# Patient Record
Sex: Male | Born: 1960
Health system: Southern US, Community
[De-identification: ages and names within clinical notes are randomized; demographics above are authoritative.]

## PROBLEM LIST (undated history)

## (undated) DIAGNOSIS — R011 Cardiac murmur, unspecified: Secondary | ICD-10-CM

## (undated) DIAGNOSIS — G4730 Sleep apnea, unspecified: Secondary | ICD-10-CM

## (undated) DIAGNOSIS — T7840XA Allergy, unspecified, initial encounter: Secondary | ICD-10-CM

## (undated) DIAGNOSIS — F419 Anxiety disorder, unspecified: Secondary | ICD-10-CM

## (undated) DIAGNOSIS — E7800 Pure hypercholesterolemia, unspecified: Secondary | ICD-10-CM

## (undated) DIAGNOSIS — I10 Essential (primary) hypertension: Secondary | ICD-10-CM

## (undated) DIAGNOSIS — B019 Varicella without complication: Secondary | ICD-10-CM

## (undated) HISTORY — PX: WISDOM TOOTH EXTRACTION: SHX21

## (undated) HISTORY — DX: Anxiety disorder, unspecified: F41.9

## (undated) HISTORY — DX: Allergy, unspecified, initial encounter: T78.40XA

## (undated) HISTORY — DX: Varicella without complication: B01.9

## (undated) HISTORY — DX: Pure hypercholesterolemia, unspecified: E78.00

## (undated) HISTORY — DX: Sleep apnea, unspecified: G47.30

## (undated) HISTORY — DX: Cardiac murmur, unspecified: R01.1

## (undated) HISTORY — PX: NO PAST SURGERIES: SHX2092

## (undated) HISTORY — PX: COLONOSCOPY: SHX174

---

## 2006-05-02 LAB — HM COLONOSCOPY

## 2008-05-02 LAB — HM COLONOSCOPY

## 2015-04-09 ENCOUNTER — Encounter (HOSPITAL_BASED_OUTPATIENT_CLINIC_OR_DEPARTMENT_OTHER): Payer: Self-pay | Admitting: *Deleted

## 2015-04-09 ENCOUNTER — Emergency Department (HOSPITAL_BASED_OUTPATIENT_CLINIC_OR_DEPARTMENT_OTHER): Payer: Managed Care, Other (non HMO)

## 2015-04-09 ENCOUNTER — Emergency Department (HOSPITAL_BASED_OUTPATIENT_CLINIC_OR_DEPARTMENT_OTHER)
Admission: EM | Admit: 2015-04-09 | Discharge: 2015-04-09 | Disposition: A | Discharge status: Home / Self Care | Payer: Managed Care, Other (non HMO) | Attending: Emergency Medicine | Admitting: Emergency Medicine

## 2015-04-09 DIAGNOSIS — F17210 Nicotine dependence, cigarettes, uncomplicated: Secondary | ICD-10-CM | POA: Diagnosis not present

## 2015-04-09 DIAGNOSIS — S4991XA Unspecified injury of right shoulder and upper arm, initial encounter: Secondary | ICD-10-CM | POA: Diagnosis present

## 2015-04-09 DIAGNOSIS — Y9389 Activity, other specified: Secondary | ICD-10-CM | POA: Diagnosis not present

## 2015-04-09 DIAGNOSIS — M791 Myalgia: Secondary | ICD-10-CM | POA: Diagnosis not present

## 2015-04-09 DIAGNOSIS — S46811A Strain of other muscles, fascia and tendons at shoulder and upper arm level, right arm, initial encounter: Secondary | ICD-10-CM | POA: Insufficient documentation

## 2015-04-09 DIAGNOSIS — S29012A Strain of muscle and tendon of back wall of thorax, initial encounter: Secondary | ICD-10-CM

## 2015-04-09 DIAGNOSIS — Z79899 Other long term (current) drug therapy: Secondary | ICD-10-CM | POA: Diagnosis not present

## 2015-04-09 DIAGNOSIS — Y998 Other external cause status: Secondary | ICD-10-CM | POA: Diagnosis not present

## 2015-04-09 DIAGNOSIS — M545 Low back pain: Secondary | ICD-10-CM | POA: Insufficient documentation

## 2015-04-09 DIAGNOSIS — X58XXXA Exposure to other specified factors, initial encounter: Secondary | ICD-10-CM | POA: Insufficient documentation

## 2015-04-09 DIAGNOSIS — I10 Essential (primary) hypertension: Secondary | ICD-10-CM | POA: Insufficient documentation

## 2015-04-09 DIAGNOSIS — Y9289 Other specified places as the place of occurrence of the external cause: Secondary | ICD-10-CM | POA: Insufficient documentation

## 2015-04-09 HISTORY — DX: Essential (primary) hypertension: I10

## 2015-04-09 LAB — TROPONIN I: Troponin I: <0.03 ng/mL (ref <0.031)

## 2015-04-09 LAB — COMPREHENSIVE METABOLIC PANEL
ALK PHOS: 67 U/L (ref 38–126)
ALT: 39 U/L (ref 17–63)
AST: 28 U/L (ref 15–41)
Albumin: 5.3 g/dL — ABNORMAL HIGH (ref 3.5–5.0)
Anion gap: 9 (ref 5–15)
BILIRUBIN TOTAL: 2.9 mg/dL — AB (ref 0.3–1.2)
BUN: 16 mg/dL (ref 6–20)
CHLORIDE: 106 mmol/L (ref 101–111)
CO2: 24 mmol/L (ref 22–32)
CREATININE: 1.18 mg/dL (ref 0.61–1.24)
Calcium: 9.6 mg/dL (ref 8.9–10.3)
GFR calc Af Amer: >60 mL/min (ref >60)
Glucose, Bld: 135 mg/dL — ABNORMAL HIGH (ref 65–99)
Potassium: 3.4 mmol/L — ABNORMAL LOW (ref 3.5–5.1)
Sodium: 139 mmol/L (ref 135–145)
TOTAL PROTEIN: 9 g/dL — AB (ref 6.5–8.1)

## 2015-04-09 LAB — CBC WITH DIFFERENTIAL/PLATELET
BASOS ABS: 0 10*3/uL (ref 0.0–0.1)
Basophils Relative: 0 %
Eosinophils Absolute: 0.1 10*3/uL (ref 0.0–0.7)
Eosinophils Relative: 1 %
HEMATOCRIT: 46.3 % (ref 39.0–52.0)
HEMOGLOBIN: 16.6 g/dL (ref 13.0–17.0)
LYMPHS PCT: 11 %
Lymphs Abs: 1.2 10*3/uL (ref 0.7–4.0)
MCH: 31.8 pg (ref 26.0–34.0)
MCHC: 35.9 g/dL (ref 30.0–36.0)
MCV: 88.7 fL (ref 78.0–100.0)
Monocytes Absolute: 0.9 10*3/uL (ref 0.1–1.0)
Monocytes Relative: 8 %
NEUTROS ABS: 8.6 10*3/uL — AB (ref 1.7–7.7)
Neutrophils Relative %: 80 %
Platelets: 208 10*3/uL (ref 150–400)
RBC: 5.22 MIL/uL (ref 4.22–5.81)
RDW: 12.4 % (ref 11.5–15.5)
WBC: 10.8 10*3/uL — AB (ref 4.0–10.5)

## 2015-04-09 LAB — D-DIMER, QUANTITATIVE (NOT AT ARMC)

## 2015-04-09 MED ORDER — METHOCARBAMOL 500 MG PO TABS: 1000.0000 mg | ORAL_TABLET | Freq: Three times a day (TID) | ORAL | Status: DC | PRN

## 2015-04-09 MED ORDER — OXYCODONE-ACETAMINOPHEN 5-325 MG PO TABS
2.0000 | ORAL_TABLET | Freq: Once | ORAL | Status: AC
  Administered 2015-04-09: 2 via ORAL
  Filled 2015-04-09: qty 2

## 2015-04-09 MED ORDER — KETOROLAC TROMETHAMINE 60 MG/2ML IM SOLN
60.0000 mg | Freq: Once | INTRAMUSCULAR | Status: AC
  Administered 2015-04-09: 60 mg via INTRAMUSCULAR
  Filled 2015-04-09: qty 2

## 2015-04-09 MED ORDER — IOHEXOL 350 MG/ML SOLN
100.0000 mL | Freq: Once | INTRAVENOUS | Status: AC | PRN
  Administered 2015-04-09: 100 mL via INTRAVENOUS

## 2015-04-09 MED ORDER — METHOCARBAMOL 500 MG PO TABS
1000.0000 mg | ORAL_TABLET | Freq: Once | ORAL | Status: AC
  Administered 2015-04-09: 1000 mg via ORAL
  Filled 2015-04-09: qty 2

## 2015-04-09 MED ORDER — OXYCODONE-ACETAMINOPHEN 5-325 MG PO TABS: 1.0000 | ORAL_TABLET | ORAL | Status: DC | PRN

## 2015-04-09 MED ORDER — IBUPROFEN 600 MG PO TABS: 600.0000 mg | ORAL_TABLET | Freq: Four times a day (QID) | ORAL | Status: DC | PRN

## 2015-04-09 NOTE — ED Notes (Signed)
Safety measures in place, sr x 2 up, wife at bedside

## 2015-04-09 NOTE — ED Provider Notes (Signed)
CSN: [id number]     Arrival date & time 04/09/15  [id number] History   First MD Initiated Contact with Patient 04/09/15 1115     Chief Complaint  Patient presents with  . Arm Pain     (Consider location/radiation/quality/duration/timing/severity/associated sxs/prior Treatment) HPI Patient presents with right thoracic back pain worsening over the last few days. No radiation of the pain. No known trauma or heavy lifting. Patient works at a desk on the computer. No recent extended travel. No lower extremity swelling or pain. Denies any shortness of breath or chest pain. Pain is worse with movement and palpation. Patient went to medical clinic to have this evaluated. Became diaphoretic and was referred to the emergency department for evaluation. Past Medical History  Diagnosis Date  . Hypertension    History reviewed. No pertinent past surgical history. No family history on file. Social History  Substance Use Topics  . Smoking status: Current Every Day Smoker -- 1.00 packs/day for 25 years    Types: Cigarettes  . Smokeless tobacco: None  . Alcohol Use: Yes     Comment: social    Review of Systems  Constitutional: Positive for diaphoresis. Negative for fever and chills.  Respiratory: Negative for cough and shortness of breath.   Cardiovascular: Negative for chest pain, palpitations and leg swelling.  Gastrointestinal: Negative for nausea, vomiting and abdominal pain.  Musculoskeletal: Positive for myalgias and back pain. Negative for neck pain and neck stiffness.  Skin: Negative for rash and wound.  Neurological: Negative for dizziness, syncope, weakness, light-headedness, numbness and headaches.  All other systems reviewed and are negative.     Allergies  Review of patient's allergies indicates no known allergies.  Home Medications   Prior to Admission medications   Medication Sig Start Date End Date Taking? Authorizing Provider  amLODipine (NORVASC) 10 MG tablet Take 10 mg by  mouth daily.   Yes Historical Provider, MD  ibuprofen (ADVIL,MOTRIN) 600 MG tablet Take 1 tablet (600 mg total) by mouth every 6 (six) hours as needed. 04/09/15   Julianne Rice, MD  methocarbamol (ROBAXIN) 500 MG tablet Take 2 tablets (1,000 mg total) by mouth every 8 (eight) hours as needed for muscle spasms. 04/09/15   Julianne Rice, MD  oxyCODONE-acetaminophen (PERCOCET) 5-325 MG tablet Take 1-2 tablets by mouth every 4 (four) hours as needed for severe pain. 04/09/15   Julianne Rice, MD  terazosin (HYTRIN) 1 MG capsule Take 1 mg by mouth at bedtime.   Yes Historical Provider, MD   BP 145/77 mmHg  Pulse 66  Temp(Src) 98.1 F (36.7 C) (Oral)  Resp 18  Ht 5\' 11"  (1.803 m)  Wt 260 lb (117.935 kg)  BMI 36.28 kg/m2  SpO2 98% Physical Exam  Constitutional: He is oriented to person, place, and time. He appears well-developed and well-nourished. No distress.  HENT:  Head: Normocephalic and atraumatic.  Mouth/Throat: Oropharynx is clear and moist. No oropharyngeal exudate.  Eyes: EOM are normal. Pupils are equal, round, and reactive to light.  Neck: Normal range of motion. Neck supple. No JVD present.  Cardiovascular: Normal rate and regular rhythm.  Exam reveals no gallop and no friction rub.   No murmur heard. Pulmonary/Chest: Effort normal and breath sounds normal. No respiratory distress. He has no wheezes. He has no rales. He exhibits no tenderness.  Abdominal: Soft. Bowel sounds are normal. He exhibits no distension and no mass. There is no tenderness. There is no rebound and no guarding.  Musculoskeletal: Normal range of motion. He  exhibits no edema or tenderness.  No calf swelling or tenderness. Patient does have some tenderness to palpation over the right rhomboid musculature. There is no midline thoracic or lumbar tenderness.  Neurological: He is alert and oriented to person, place, and time.  5/5 motor in all extremities. Sensation is fully intact. Patient is ambulating without  any difficulty.  Skin: Skin is warm and dry. No rash noted. No erythema.  Psychiatric: He has a normal mood and affect. His behavior is normal.  Nursing note and vitals reviewed.   ED Course  Procedures (including critical care time) Labs Review Labs Reviewed  CBC WITH DIFFERENTIAL/PLATELET - Abnormal; Notable for the following:    WBC 10.8 (*)    Neutro Abs 8.6 (*)    All other components within normal limits  COMPREHENSIVE METABOLIC PANEL - Abnormal; Notable for the following:    Potassium 3.4 (*)    Glucose, Bld 135 (*)    Total Protein 9.0 (*)    Albumin 5.3 (*)    Total Bilirubin 2.9 (*)    All other components within normal limits  D-DIMER, QUANTITATIVE (NOT AT [facility])  TROPONIN I    Imaging Review Dg Chest 2 View  04/09/2015  CLINICAL DATA:  Right upper thoracic back pain for 2 weeks. EXAM: CHEST  2 VIEW COMPARISON:  None. FINDINGS: The heart size and mediastinal contours are within normal limits. Both lungs are clear. No pneumothorax or pleural effusion is noted. The visualized skeletal structures are unremarkable. IMPRESSION: No active cardiopulmonary disease. Electronically Signed   By: Marijo Conception, M.D.   On: 04/09/2015 11:53   Ct Angio Chest Aorta W/cm &/or Wo/cm  04/09/2015  CLINICAL DATA:  Right upper thoracic pain for 1.5 weeks. EXAM: CT ANGIOGRAPHY CHEST WITH CONTRAST TECHNIQUE: Multidetector CT imaging of the chest was performed using the standard protocol during bolus administration of intravenous contrast. Multiplanar CT image reconstructions and MIPs were obtained to evaluate the vascular anatomy. CONTRAST:  181mL OMNIPAQUE IOHEXOL 350 MG/ML SOLN COMPARISON:  None. FINDINGS: THORACIC INLET/BODY WALL: No acute abnormality. MEDIASTINUM: Normal heart size. No pericardial effusion. Moderate for age aortic valve calcification and possible thickening. Normal caliber of the aorta. No aortic atherosclerotic change or aneurysm. No dissection or intramural hematoma. The  opacified pulmonary arteries show no filling defect. No adenopathy. LUNG WINDOWS: There is no edema, consolidation, effusion, or pneumothorax. UPPER ABDOMEN: No acute findings. Low-density liver which could be from timing or steatosis. OSSEOUS: Thoracic spondylosis. No acute fracture. No suspicious lytic or blastic lesions. Review of the MIP images confirms the above findings. IMPRESSION: 1. Unremarkable CTA of the thoracic aorta. 2. Notable for age aortic valve calcification with possible thickening. Recommend echocardiographic follow-up. 3. Right coronary artery atherosclerotic calcification. Electronically Signed   By: Monte Fantasia M.D.   On: 04/09/2015 14:16   I have personally reviewed and evaluated these images and lab results as part of my medical decision-making.   EKG Interpretation   Date/Time:  Thursday April 09 2015 11:53:18 EST Ventricular Rate:  60 PR Interval:  182 QRS Duration: 100 QT Interval:  418 QTC Calculation: 418 R Axis:   -48 Text Interpretation:  Normal sinus rhythm Left axis deviation Pulmonary  disease pattern Abnormal ECG Confirmed by Lita Mains  MD, Zhuri Krass ([id number]) on  04/09/2015 2:30:18 PM      MDM   Final diagnoses:  Rhomboid muscle strain, initial encounter    Patient with thoracic back pain. Initial workup negative including d-dimer and cardiac workup. Patient  with persistent pain despite Toradol and Robaxin. CT imaging to room aorta demonstrated no dissection but there was some calcification present patient was informed of results need to follow-up as an outpatient. He is feeling much better at this point. We'll discharge home with return precautions.    Julianne Rice, MD 04/09/15 1430

## 2015-04-09 NOTE — ED Notes (Signed)
Family at bedside. 

## 2015-04-09 NOTE — ED Notes (Signed)
ECG done as per EDP orders

## 2015-04-09 NOTE — Discharge Instructions (Signed)

## 2015-04-09 NOTE — ED Notes (Signed)
States last pain med provided much better relief

## 2015-04-09 NOTE — ED Notes (Signed)
Patient transported to X-ray via stretcher, sr x 2 up

## 2015-04-09 NOTE — ED Notes (Signed)
No chest pain or shortness of breath. 

## 2015-04-09 NOTE — ED Notes (Signed)
Rt arm pain, described, muscle cramping intermittently, "cramping around a nerve" which then causes sharp pain at shoulder with numbess at rt elbow to rt forearm

## 2015-06-09 ENCOUNTER — Encounter: Payer: Self-pay | Admitting: Behavioral Health

## 2015-06-09 ENCOUNTER — Telehealth: Payer: Self-pay | Admitting: Behavioral Health

## 2015-06-09 NOTE — Telephone Encounter (Signed)
Pre-Visit Call completed with patient and chart updated.   Pre-Visit Info documented in Specialty Comments under [employer].    

## 2015-06-09 NOTE — Addendum Note (Signed)
Addended by: Eduard Roux E on: 06/09/2015 10:37 AM   Modules accepted: Orders, Medications

## 2015-06-09 NOTE — Telephone Encounter (Signed)
Unable to reach patient at time of Pre-Visit Call.  Left message for patient to return call when available.    

## 2015-06-10 ENCOUNTER — Encounter: Payer: Self-pay | Admitting: Physician Assistant

## 2015-06-10 ENCOUNTER — Telehealth: Payer: Self-pay | Admitting: Physician Assistant

## 2015-06-10 ENCOUNTER — Ambulatory Visit (INDEPENDENT_AMBULATORY_CARE_PROVIDER_SITE_OTHER): Payer: Managed Care, Other (non HMO) | Admitting: Physician Assistant

## 2015-06-10 VITALS — BP 142/70 | HR 71 | Temp 98.0°F | Ht 71.0 in | Wt 272.4 lb

## 2015-06-10 DIAGNOSIS — I10 Essential (primary) hypertension: Secondary | ICD-10-CM | POA: Diagnosis not present

## 2015-06-10 DIAGNOSIS — Z1211 Encounter for screening for malignant neoplasm of colon: Secondary | ICD-10-CM

## 2015-06-10 DIAGNOSIS — Z23 Encounter for immunization: Secondary | ICD-10-CM

## 2015-06-10 DIAGNOSIS — G4733 Obstructive sleep apnea (adult) (pediatric): Secondary | ICD-10-CM | POA: Diagnosis not present

## 2015-06-10 MED ORDER — AMLODIPINE BESYLATE 10 MG PO TABS: 10.0000 mg | ORAL_TABLET | Freq: Every day | ORAL | Status: DC

## 2015-06-10 MED ORDER — FLUTICASONE PROPIONATE 50 MCG/ACT NA SUSP: 2.0000 | Freq: Every day | NASAL | Status: DC

## 2015-06-10 NOTE — Progress Notes (Signed)
Patient presents to clinic today to establish care.  Patient with history of HTN, currently on Amlodipine 10 mg daily and Terazosin 1 mg nightly. Endorses prior workup for HTN including echo and stress test that were both unremarkable. Patient denies chest pain, palpitations, lightheadedness, dizziness, vision changes or frequent headaches.  BP Readings from Last 3 Encounters:  06/10/15 142/70  04/09/15 161/89   Patient is a current cigarette smoker. He is trying to quit by using Nicoderm. Is down from 1ppd to 0.5 ppd. Denies history of high cholesterol but does endorses history of elevated triglycerides. Is watching diet and trying to exercise but notes he could do a lot better.   Past Medical History  Diagnosis Date  . Hypertension   . Allergy     Seasonal  . Sleep apnea     CPAP nightly  . Heart murmur     Asymptomatic -- Patient endorses previous negative evaluation  . Chicken pox     Current Outpatient Prescriptions on File Prior to Visit  Medication Sig Dispense Refill  . amLODipine (NORVASC) 10 MG tablet Take 10 mg by mouth daily.    . fluticasone (FLONASE) 50 MCG/ACT nasal spray Place 2 sprays into both nostrils daily.    . Tadalafil (CIALIS PO) Take 1 tablet by mouth as needed.    . terazosin (HYTRIN) 1 MG capsule Take 1 mg by mouth at bedtime.     No current facility-administered medications on file prior to visit.    No Known Allergies  Family History  Problem Relation Age of Onset  . Heart attack Father 77  . Heart attack Paternal Uncle 57  . Heart attack Paternal Grandfather 54  . Cancer Maternal Grandmother 62    Unsure  . Alcohol abuse Sister     Social History   Social History  . Marital Status: Married    Spouse Name: N/A  . Number of Children: N/A  . Years of Education: N/A   Social History Main Topics  . Smoking status: Current Every Day Smoker -- 0.50 packs/day for 25 years    Types: Cigarettes  . Smokeless tobacco: Never Used   Comment: is on the Nictoine patch presently  . Alcohol Use: 0.0 oz/week    0 Standard drinks or equivalent per week     Comment: social  . Drug Use: Yes    Special: Marijuana  . Sexual Activity: Not Asked   Other Topics Concern  . None   Social History Narrative   ROS   BP 142/70 mmHg  Pulse 71  Temp(Src) 98 F (36.7 C) (Oral)  Ht 5' 11" (1.803 m)  Wt 272 lb 6.4 oz (123.56 kg)  BMI 38.01 kg/m2  SpO2 96%  Physical Exam  Constitutional: He is oriented to person, place, and time and well-developed, well-nourished, and in no distress.  HENT:  Head: Normocephalic and atraumatic.  Eyes: Conjunctivae are normal.  Neck: Neck supple. No thyromegaly present.  Cardiovascular: Normal rate, regular rhythm, normal heart sounds and intact distal pulses.   Pulmonary/Chest: Effort normal and breath sounds normal. No respiratory distress. He has no wheezes. He has no rales. He exhibits no tenderness.  Neurological: He is alert and oriented to person, place, and time.  Skin: Skin is warm and dry. No rash noted.  Psychiatric: Affect normal.  Vitals reviewed.   Recent Results (from the past 2160 hour(s))  CBC with Differential/Platelet     Status: Abnormal   Collection Time: 04/09/15 11:40 AM  Result  Value Ref Range   WBC 10.8 (H) 4.0 - 10.5 K/uL   RBC 5.22 4.22 - 5.81 MIL/uL   Hemoglobin 16.6 13.0 - 17.0 g/dL   HCT 46.3 39.0 - 52.0 %   MCV 88.7 78.0 - 100.0 fL   MCH 31.8 26.0 - 34.0 pg   MCHC 35.9 30.0 - 36.0 g/dL   RDW 12.4 11.5 - 15.5 %   Platelets 208 150 - 400 K/uL   Neutrophils Relative % 80 %   Neutro Abs 8.6 (H) 1.7 - 7.7 K/uL   Lymphocytes Relative 11 %   Lymphs Abs 1.2 0.7 - 4.0 K/uL   Monocytes Relative 8 %   Monocytes Absolute 0.9 0.1 - 1.0 K/uL   Eosinophils Relative 1 %   Eosinophils Absolute 0.1 0.0 - 0.7 K/uL   Basophils Relative 0 %   Basophils Absolute 0.0 0.0 - 0.1 K/uL  Comprehensive metabolic panel     Status: Abnormal   Collection Time: 04/09/15 11:40  AM  Result Value Ref Range   Sodium 139 135 - 145 mmol/L   Potassium 3.4 (L) 3.5 - 5.1 mmol/L   Chloride 106 101 - 111 mmol/L   CO2 24 22 - 32 mmol/L   Glucose, Bld 135 (H) 65 - 99 mg/dL   BUN 16 6 - 20 mg/dL   Creatinine, Ser 1.18 0.61 - 1.24 mg/dL   Calcium 9.6 8.9 - 10.3 mg/dL   Total Protein 9.0 (H) 6.5 - 8.1 g/dL   Albumin 5.3 (H) 3.5 - 5.0 g/dL   AST 28 15 - 41 U/L   ALT 39 17 - 63 U/L   Alkaline Phosphatase 67 38 - 126 U/L   Total Bilirubin 2.9 (H) 0.3 - 1.2 mg/dL   GFR calc non Af Amer >60 >60 mL/min   GFR calc Af Amer >60 >60 mL/min    Comment: (NOTE) The eGFR has been calculated using the CKD EPI equation. This calculation has not been validated in all clinical situations. eGFR's persistently <60 mL/min signify possible Chronic Kidney Disease.    Anion gap 9 5 - 15  D-dimer, quantitative (not at [facility])     Status: None   Collection Time: 04/09/15 11:40 AM  Result Value Ref Range   D-Dimer, Quant <0.27 0.00 - 0.50 ug/mL-FEU    Comment: (NOTE) At the manufacturer cut-off of 0.50 ug/mL FEU, this assay has been documented to exclude PE with a sensitivity and negative predictive value of 97 to 99%.  At this time, this assay has not been approved by the FDA to exclude DVT/VTE. Results should be correlated with clinical presentation.   Troponin I     Status: None   Collection Time: 04/09/15 11:40 AM  Result Value Ref Range   Troponin I <0.03 <0.031 ng/mL    Comment:        NO INDICATION OF MYOCARDIAL INJURY.    Assessment/Plan: Essential hypertension, benign BP initially above goal at 158/80. On recheck at end of visit BP at 142/70 . Asymptomatic. DASH diet and exercise encouraged. Continue Norvasc and Hyrtin. Follow-up 1 month for CPE  OSA (obstructive sleep apnea) Referral to pulmonary placed for monitoring and repeat assessment. Patient new to the area.  Encounter for immunization Flu shot given by nursing staff

## 2015-06-10 NOTE — Telephone Encounter (Signed)
Relation to [id number] Call back [phone] Pharmacy: [facility] #[id number] - [place], [place] - [address] [phone] (Phone) [phone] (Fax)         Reason for call:  Patient requesting amLODipine (NORVASC) 10 MG tablet, fluticasone (FLONASE) 50 MCG/ACT nasal spray, Tadalafil (CIALIS PO), terazosin (HYTRIN) 1 MG capsule

## 2015-06-10 NOTE — Assessment & Plan Note (Signed)
Flu shot given by nursing staff. 

## 2015-06-10 NOTE — Telephone Encounter (Signed)
Called and spoke with the pt and he stated that he will only need the Amlodipine 10mg  and Flonase refilled for now.  Rx's sent to the pharmacy by e-script.//AB/CMA

## 2015-06-10 NOTE — Patient Instructions (Addendum)
Please continue medications as directed. Increase exercise to an eventual goal of 150 minutes per week. Continue smoking cessation efforts.  Follow the diet below. We will reassess BP at follow-up for CPE in 1 month.  DASH Eating Plan DASH stands for "Dietary Approaches to Stop Hypertension." The DASH eating plan is a healthy eating plan that has been shown to reduce high blood pressure (hypertension). Additional health benefits may include reducing the risk of type 2 diabetes mellitus, heart disease, and stroke. The DASH eating plan may also help with weight loss. WHAT DO I NEED TO KNOW ABOUT THE DASH EATING PLAN? For the DASH eating plan, you will follow these general guidelines:  Choose foods with a percent daily value for sodium of less than 5% (as listed on the food label).  Use salt-free seasonings or herbs instead of table salt or sea salt.  Check with your health care provider or pharmacist before using salt substitutes.  Eat lower-sodium products, often labeled as "lower sodium" or "no salt added."  Eat fresh foods.  Eat more vegetables, fruits, and low-fat dairy products.  Choose whole grains. Look for the word "whole" as the first word in the ingredient list.  Choose fish and skinless chicken or [place] more often than red meat. Limit fish, poultry, and meat to 6 oz (170 g) each day.  Limit sweets, desserts, sugars, and sugary drinks.  Choose heart-healthy fats.  Limit cheese to 1 oz (28 g) per day.  Eat more home-cooked food and less restaurant, buffet, and fast food.  Limit fried foods.  Cook foods using methods other than frying.  Limit canned vegetables. If you do use them, rinse them well to decrease the sodium.  When eating at a restaurant, ask that your food be prepared with less salt, or no salt if possible. WHAT FOODS CAN I EAT? Seek help from a dietitian for individual calorie needs. Grains Whole grain or whole wheat bread. Brown rice. Whole grain or  whole wheat pasta. Quinoa, bulgur, and whole grain cereals. Low-sodium cereals. Corn or whole wheat flour tortillas. Whole grain cornbread. Whole grain crackers. Low-sodium crackers. Vegetables Fresh or frozen vegetables (raw, steamed, roasted, or grilled). Low-sodium or reduced-sodium tomato and vegetable juices. Low-sodium or reduced-sodium tomato sauce and paste. Low-sodium or reduced-sodium canned vegetables.  Fruits All fresh, canned (in natural juice), or frozen fruits. Meat and Other Protein Products Ground beef (85% or leaner), grass-fed beef, or beef trimmed of fat. Skinless chicken or [place]. Ground chicken or [place]. Pork trimmed of fat. All fish and seafood. Eggs. Dried beans, peas, or lentils. Unsalted nuts and seeds. Unsalted canned beans. Dairy Low-fat dairy products, such as skim or 1% milk, 2% or reduced-fat cheeses, low-fat ricotta or cottage cheese, or plain low-fat yogurt. Low-sodium or reduced-sodium cheeses. Fats and Oils Tub margarines without trans fats. Light or reduced-fat mayonnaise and salad dressings (reduced sodium). Avocado. Safflower, olive, or canola oils. Natural peanut or almond butter. Other Unsalted popcorn and pretzels. The items listed above may not be a complete list of recommended foods or beverages. Contact your dietitian for more options. WHAT FOODS ARE NOT RECOMMENDED? Grains White bread. White pasta. White rice. Refined cornbread. Bagels and croissants. Crackers that contain trans fat. Vegetables Creamed or fried vegetables. Vegetables in a cheese sauce. Regular canned vegetables. Regular canned tomato sauce and paste. Regular tomato and vegetable juices. Fruits Dried fruits. Canned fruit in light or heavy syrup. Fruit juice. Meat and Other Protein Products Fatty cuts of meat. Ribs, chicken  wings, bacon, sausage, bologna, salami, chitterlings, fatback, hot dogs, bratwurst, and packaged luncheon meats. Salted nuts and seeds. Canned beans with  salt. Dairy Whole or 2% milk, cream, half-and-half, and cream cheese. Whole-fat or sweetened yogurt. Full-fat cheeses or blue cheese. Nondairy creamers and whipped toppings. Processed cheese, cheese spreads, or cheese curds. Condiments Onion and garlic salt, seasoned salt, table salt, and sea salt. Canned and packaged gravies. Worcestershire sauce. Tartar sauce. Barbecue sauce. Teriyaki sauce. Soy sauce, including reduced sodium. Steak sauce. Fish sauce. Oyster sauce. Cocktail sauce. Horseradish. Ketchup and mustard. Meat flavorings and tenderizers. Bouillon cubes. Hot sauce. Tabasco sauce. Marinades. Taco seasonings. Relishes. Fats and Oils Butter, stick margarine, lard, shortening, ghee, and bacon fat. Coconut, palm kernel, or palm oils. Regular salad dressings. Other Pickles and olives. Salted popcorn and pretzels. The items listed above may not be a complete list of foods and beverages to avoid. Contact your dietitian for more information. WHERE CAN I FIND MORE INFORMATION? National Heart, Lung, and Blood Institute: travelstabloid.com   This information is not intended to replace advice given to you by your health care provider. Make sure you discuss any questions you have with your health care provider.   Document Released: 04/07/2011 Document Revised: 05/09/2014 Document Reviewed: 02/20/2013 Elsevier Interactive Patient Education [employer].

## 2015-06-10 NOTE — Assessment & Plan Note (Signed)
Referral to pulmonary placed for monitoring and repeat assessment. Patient new to the area.

## 2015-06-10 NOTE — Assessment & Plan Note (Addendum)
BP initially above goal at 158/80. On recheck at end of visit BP at 142/70 . Asymptomatic. DASH diet and exercise encouraged. Continue Norvasc and Hyrtin. Follow-up 1 month for CPE

## 2015-06-10 NOTE — Progress Notes (Signed)
Pre visit review using our clinic review tool, if applicable. No additional management support is needed unless otherwise documented below in the visit note. 

## 2015-06-29 ENCOUNTER — Encounter: Payer: Self-pay | Admitting: Physician Assistant

## 2015-06-29 ENCOUNTER — Ambulatory Visit (INDEPENDENT_AMBULATORY_CARE_PROVIDER_SITE_OTHER): Payer: Managed Care, Other (non HMO) | Admitting: Physician Assistant

## 2015-06-29 VITALS — BP 148/81 | HR 90 | Temp 97.2°F | Ht 71.0 in | Wt 261.1 lb

## 2015-06-29 DIAGNOSIS — J019 Acute sinusitis, unspecified: Secondary | ICD-10-CM | POA: Diagnosis not present

## 2015-06-29 DIAGNOSIS — B9689 Other specified bacterial agents as the cause of diseases classified elsewhere: Secondary | ICD-10-CM

## 2015-06-29 MED ORDER — AMOXICILLIN-POT CLAVULANATE 875-125 MG PO TABS: 1.0000 | ORAL_TABLET | Freq: Two times a day (BID) | ORAL | Status: DC

## 2015-06-29 NOTE — Patient Instructions (Signed)
Please take antibiotic as directed.  Increase fluid intake.  Use Saline nasal spray.  Take a daily multivitamin. Continue Claritin.  Place a humidifier in the bedroom.  Please call or return clinic if symptoms are not improving.  Sinusitis Sinusitis is redness, soreness, and swelling (inflammation) of the paranasal sinuses. Paranasal sinuses are air pockets within the bones of your face (beneath the eyes, the middle of the forehead, or above the eyes). In healthy paranasal sinuses, mucus is able to drain out, and air is able to circulate through them by way of your nose. However, when your paranasal sinuses are inflamed, mucus and air can become trapped. This can allow bacteria and other germs to grow and cause infection. Sinusitis can develop quickly and last only a short time (acute) or continue over a long period (chronic). Sinusitis that lasts for more than 12 weeks is considered chronic.  CAUSES  Causes of sinusitis include:  Allergies.  Structural abnormalities, such as displacement of the cartilage that separates your nostrils (deviated septum), which can decrease the air flow through your nose and sinuses and affect sinus drainage.  Functional abnormalities, such as when the small hairs (cilia) that line your sinuses and help remove mucus do not work properly or are not present. SYMPTOMS  Symptoms of acute and chronic sinusitis are the same. The primary symptoms are pain and pressure around the affected sinuses. Other symptoms include:  Upper toothache.  Earache.  Headache.  Bad breath.  Decreased sense of smell and taste.  A cough, which worsens when you are lying flat.  Fatigue.  Fever.  Thick drainage from your nose, which often is green and may contain pus (purulent).  Swelling and warmth over the affected sinuses. DIAGNOSIS  Your caregiver will perform a physical exam. During the exam, your caregiver may:  Look in your nose for signs of abnormal growths in your  nostrils (nasal polyps).  Tap over the affected sinus to check for signs of infection.  View the inside of your sinuses (endoscopy) with a special imaging device with a light attached (endoscope), which is inserted into your sinuses. If your caregiver suspects that you have chronic sinusitis, one or more of the following tests may be recommended:  Allergy tests.  Nasal culture A sample of mucus is taken from your nose and sent to a lab and screened for bacteria.  Nasal cytology A sample of mucus is taken from your nose and examined by your caregiver to determine if your sinusitis is related to an allergy. TREATMENT  Most cases of acute sinusitis are related to a viral infection and will resolve on their own within 10 days. Sometimes medicines are prescribed to help relieve symptoms (pain medicine, decongestants, nasal steroid sprays, or saline sprays).  However, for sinusitis related to a bacterial infection, your caregiver will prescribe antibiotic medicines. These are medicines that will help kill the bacteria causing the infection.  Rarely, sinusitis is caused by a fungal infection. In theses cases, your caregiver will prescribe antifungal medicine. For some cases of chronic sinusitis, surgery is needed. Generally, these are cases in which sinusitis recurs more than 3 times per year, despite other treatments. HOME CARE INSTRUCTIONS   Drink plenty of water. Water helps thin the mucus so your sinuses can drain more easily.  Use a humidifier.  Inhale steam 3 to 4 times a day (for example, sit in the bathroom with the shower running).  Apply a warm, moist washcloth to your face 3 to 4 times  a day, or as directed by your caregiver.  Use saline nasal sprays to help moisten and clean your sinuses.  Take over-the-counter or prescription medicines for pain, discomfort, or fever only as directed by your caregiver. SEEK IMMEDIATE MEDICAL CARE IF:  You have increasing pain or severe  headaches.  You have nausea, vomiting, or drowsiness.  You have swelling around your face.  You have vision problems.  You have a stiff neck.  You have difficulty breathing. MAKE SURE YOU:   Understand these instructions.  Will watch your condition.  Will get help right away if you are not doing well or get worse. Document Released: 04/18/2005 Document Revised: 07/11/2011 Document Reviewed: 05/03/2011 Exit[employer] Patient Information 2014 [employer].   

## 2015-06-29 NOTE — Progress Notes (Signed)
Pre visit review using our clinic review tool, if applicable. No additional management support is needed unless otherwise documented below in the visit note. 

## 2015-06-29 NOTE — Progress Notes (Signed)
Patient presents to clinic today c/o 10 days of sinus pressure, sinus pain with headaches and facial pain. Endorses dry cough and fatigue. Notes R ear pain x 1 day. Denies drainage from ear. Endorses mild fever off an on for the past few days. Denies recent travel or sick contact. Has history of seasonal allergies.   Past Medical History  Diagnosis Date  . Hypertension   . Allergy     Seasonal  . Sleep apnea     CPAP nightly  . Heart murmur     Asymptomatic -- Patient endorses previous negative evaluation  . Chicken pox     Current Outpatient Prescriptions on File Prior to Visit  Medication Sig Dispense Refill  . amLODipine (NORVASC) 10 MG tablet Take 1 tablet (10 mg total) by mouth daily. 30 tablet 0  . fluticasone (FLONASE) 50 MCG/ACT nasal spray Place 2 sprays into both nostrils daily. 16 g 0  . ibuprofen (ADVIL,MOTRIN) 600 MG tablet Take 1 tablet by mouth as needed.  0  . terazosin (HYTRIN) 1 MG capsule Take 1 mg by mouth at bedtime.     No current facility-administered medications on file prior to visit.    No Known Allergies  Family History  Problem Relation Age of Onset  . Heart attack Father 23  . Heart attack Paternal Uncle 80  . Heart attack Paternal Grandfather 60  . Cancer Maternal Grandmother 78    Unsure  . Alcohol abuse Sister     Social History   Social History  . Marital Status: Married    Spouse Name: N/A  . Number of Children: N/A  . Years of Education: N/A   Social History Main Topics  . Smoking status: Current Every Day Smoker -- 0.50 packs/day for 25 years    Types: Cigarettes  . Smokeless tobacco: Never Used     Comment: is on the Nictoine patch presently  . Alcohol Use: 0.0 oz/week    0 Standard drinks or equivalent per week     Comment: social  . Drug Use: Yes    Special: Marijuana  . Sexual Activity: Not Asked   Other Topics Concern  . None   Social History Narrative   Review of Systems - See HPI.  All other ROS are  negative.  BP 148/81 mmHg  Pulse 90  Temp(Src) 97.2 F (36.2 C) (Oral)  Ht '5\' 11"'$  (1.803 m)  Wt 261 lb 1.6 oz (118.434 kg)  BMI 36.43 kg/m2  SpO2 97%  Physical Exam  Constitutional: He is oriented to person, place, and time and well-developed, well-nourished, and in no distress.  HENT:  Head: Normocephalic and atraumatic.  Right Ear: Tympanic membrane is erythematous.  Nose: Mucosal edema and rhinorrhea present. Right sinus exhibits frontal sinus tenderness. Left sinus exhibits frontal sinus tenderness.  Mouth/Throat: Uvula is midline, oropharynx is clear and moist and mucous membranes are normal.  Eyes: Conjunctivae are normal.  Cardiovascular: Normal rate, regular rhythm, normal heart sounds and intact distal pulses.   Pulmonary/Chest: Effort normal and breath sounds normal. No respiratory distress. He has no wheezes. He has no rales. He exhibits no tenderness.  Neurological: He is alert and oriented to person, place, and time.  Skin: Skin is warm and dry. No rash noted.  Psychiatric: Affect normal.  Vitals reviewed.   Recent Results (from the past 2160 hour(s))  CBC with Differential/Platelet     Status: Abnormal   Collection Time: 04/09/15 11:40 AM  Result Value Ref Range  WBC 10.8 (H) 4.0 - 10.5 K/uL   RBC 5.22 4.22 - 5.81 MIL/uL   Hemoglobin 16.6 13.0 - 17.0 g/dL   HCT 46.3 39.0 - 52.0 %   MCV 88.7 78.0 - 100.0 fL   MCH 31.8 26.0 - 34.0 pg   MCHC 35.9 30.0 - 36.0 g/dL   RDW 12.4 11.5 - 15.5 %   Platelets 208 150 - 400 K/uL   Neutrophils Relative % 80 %   Neutro Abs 8.6 (H) 1.7 - 7.7 K/uL   Lymphocytes Relative 11 %   Lymphs Abs 1.2 0.7 - 4.0 K/uL   Monocytes Relative 8 %   Monocytes Absolute 0.9 0.1 - 1.0 K/uL   Eosinophils Relative 1 %   Eosinophils Absolute 0.1 0.0 - 0.7 K/uL   Basophils Relative 0 %   Basophils Absolute 0.0 0.0 - 0.1 K/uL  Comprehensive metabolic panel     Status: Abnormal   Collection Time: 04/09/15 11:40 AM  Result Value Ref Range    Sodium 139 135 - 145 mmol/L   Potassium 3.4 (L) 3.5 - 5.1 mmol/L   Chloride 106 101 - 111 mmol/L   CO2 24 22 - 32 mmol/L   Glucose, Bld 135 (H) 65 - 99 mg/dL   BUN 16 6 - 20 mg/dL   Creatinine, Ser 1.18 0.61 - 1.24 mg/dL   Calcium 9.6 8.9 - 10.3 mg/dL   Total Protein 9.0 (H) 6.5 - 8.1 g/dL   Albumin 5.3 (H) 3.5 - 5.0 g/dL   AST 28 15 - 41 U/L   ALT 39 17 - 63 U/L   Alkaline Phosphatase 67 38 - 126 U/L   Total Bilirubin 2.9 (H) 0.3 - 1.2 mg/dL   GFR calc non Af Amer >60 >60 mL/min   GFR calc Af Amer >60 >60 mL/min    Comment: (NOTE) The eGFR has been calculated using the CKD EPI equation. This calculation has not been validated in all clinical situations. eGFR's persistently <60 mL/min signify possible Chronic Kidney Disease.    Anion gap 9 5 - 15  D-dimer, quantitative (not at [facility])     Status: None   Collection Time: 04/09/15 11:40 AM  Result Value Ref Range   D-Dimer, Quant <0.27 0.00 - 0.50 ug/mL-FEU    Comment: (NOTE) At the manufacturer cut-off of 0.50 ug/mL FEU, this assay has been documented to exclude PE with a sensitivity and negative predictive value of 97 to 99%.  At this time, this assay has not been approved by the FDA to exclude DVT/VTE. Results should be correlated with clinical presentation.   Troponin I     Status: None   Collection Time: 04/09/15 11:40 AM  Result Value Ref Range   Troponin I <0.03 <0.031 ng/mL    Comment:        NO INDICATION OF MYOCARDIAL INJURY.     Assessment/Plan: No problem-specific assessment & plan notes found for this encounter.

## 2015-06-29 NOTE — Assessment & Plan Note (Signed)
Rx Augmentin.  Increase fluids.  Rest.  Saline nasal spray.  Probiotic.  Mucinex as directed.  Humidifier in bedroom.  Call or return to clinic if symptoms are not improving.  

## 2015-07-01 ENCOUNTER — Ambulatory Visit (INDEPENDENT_AMBULATORY_CARE_PROVIDER_SITE_OTHER): Payer: Managed Care, Other (non HMO) | Admitting: Pulmonary Disease

## 2015-07-01 ENCOUNTER — Encounter: Payer: Self-pay | Admitting: Pulmonary Disease

## 2015-07-01 VITALS — BP 132/82 | HR 76 | Ht 71.0 in | Wt 262.8 lb

## 2015-07-01 DIAGNOSIS — G4733 Obstructive sleep apnea (adult) (pediatric): Secondary | ICD-10-CM | POA: Diagnosis not present

## 2015-07-01 DIAGNOSIS — Z720 Tobacco use: Secondary | ICD-10-CM

## 2015-07-01 DIAGNOSIS — R0609 Other forms of dyspnea: Secondary | ICD-10-CM | POA: Insufficient documentation

## 2015-07-01 DIAGNOSIS — R059 Cough, unspecified: Secondary | ICD-10-CM | POA: Insufficient documentation

## 2015-07-01 DIAGNOSIS — E669 Obesity, unspecified: Secondary | ICD-10-CM | POA: Insufficient documentation

## 2015-07-01 DIAGNOSIS — R05 Cough: Secondary | ICD-10-CM | POA: Diagnosis not present

## 2015-07-01 DIAGNOSIS — Z87891 Personal history of nicotine dependence: Secondary | ICD-10-CM | POA: Insufficient documentation

## 2015-07-01 NOTE — Assessment & Plan Note (Signed)
Smoking cessation done.  

## 2015-07-01 NOTE — Assessment & Plan Note (Signed)
Has sinus issues. Continue Flonase 2 squirts per nostril at bedtime. Claritin as needed.

## 2015-07-01 NOTE — Assessment & Plan Note (Signed)
Advised on weight reduction. 

## 2015-07-01 NOTE — Assessment & Plan Note (Signed)
Patient was diagnosed with obstructive sleep apnea in 2007. He had a lab study then which showed severe sleep apnea per his recollection. AHI 40-60 (?). Started on CPAP, 10 cm water. That was adjusted through the years.  He is significantly better with his CPAP machine. He has a current machine which is an auto CPAP from 11-14 cm. Current machine he got in 2014. He feels better using the CPAP machine. Less sleepiness. More energy. Less snoring.   He was seeing Dr. Marin Roberts in [place]. [place].  DME is [facility] in [place].  He wants a travel CPAP.  1. Continue cpap at 11-14 cm H2O. DL x 3 mos until 06/30/15  AHI 1, 99%. 2. Continue nasal pillows. 3. Advised patient to clean supplies frequently. 4. Current machine is 53 years old. It is still working well. 4. Plan to order travel CPAP, 5-15 cm water, with nasal pillows. I mentioned to him that he will most likely need to pay out of pocket as it'll not be covered. He is okay with that. We'll send an order through his DME company in [place]. We will keep that DME company for now. If the travel CPAP does not work, I advised him to look online with [employer] whether he can get a portable battery for his CPAP.

## 2015-07-01 NOTE — Patient Instructions (Signed)
1. We will order you a travel cpap.  2. Cont using cpap. 3. Let us know if you need supplies for CPAP.  Return to clinic in 1 year  J. Shirl Harris, MD [facility] Pager [phone] After 3pm or if no response, call [phone] Office: [phone], Fax: [phone]

## 2015-07-01 NOTE — Progress Notes (Signed)
Subjective:    Patient ID: Steven Sloan, male    DOB: January 05, 1961, 55 y.o.   MRN: [id number]  HPI  This is the case of Steven Sloan, 55 y.o. Male, who was referred by Dr. Elyn Aquas in consultation regarding OSA.    As you very well know, patient 27 PY smoking history.  Not been diagnosed with asthma or COPD. No lung problems. Patient was diagnosed with obstructive sleep apnea in 2007. He had a lab study then which showed severe sleep apnea per his recollection. Started on CPAP, 10 cm water. That was adjusted through the years.  He is significantly better with his CPAP machine. He has a current machine which is an auto CPAP from 11-14 cm. Current machine he got in 2014. He feels better using the CPAP machine. Less sleepiness. More energy. Less snoring.   He was seeing Dr. Marin Roberts in [place]. [place].   Patient moved to [place] almost 2 years ago. He needs a sleep MD in [place]. He needs a travel CPAP.         Review of Systems  Constitutional: Negative.  Negative for fever and unexpected weight change.       Gained 30 lbs x 2 yrs.   HENT: Positive for congestion, postnasal drip and rhinorrhea. Negative for dental problem, ear pain, nosebleeds, sinus pressure, sneezing, sore throat and trouble swallowing.   Eyes: Positive for redness and itching.  Respiratory: Negative.  Negative for cough, chest tightness, shortness of breath and wheezing.   Cardiovascular: Negative.  Negative for palpitations and leg swelling.  Gastrointestinal: Negative.  Negative for nausea and vomiting.  Endocrine: Negative.   Genitourinary: Negative.  Negative for dysuria.  Musculoskeletal: Negative.  Negative for joint swelling.  Skin: Negative.  Negative for rash.  Allergic/Immunologic: Positive for environmental allergies.  Neurological: Negative.  Negative for headaches.  Hematological: Negative.  Does not bruise/bleed easily.  Psychiatric/Behavioral:  Negative.  Negative for dysphoric mood. The patient is not nervous/anxious.   All other systems reviewed and are negative.  Past Medical History  Diagnosis Date  . Hypertension   . Allergy     Seasonal  . Sleep apnea     CPAP nightly  . Heart murmur     Asymptomatic -- Patient endorses previous negative evaluation  . Chicken pox      Family History  Problem Relation Age of Onset  . Heart attack Father 75  . Heart attack Paternal Uncle 60  . Heart attack Paternal Grandfather 71  . Cancer Maternal Grandmother 22    Unsure  . Alcohol abuse Sister      Past Surgical History  Procedure Laterality Date  . No past surgeries      Social History   Social History  . Marital Status: Married    Spouse Name: N/A  . Number of Children: N/A  . Years of Education: N/A   Occupational History  . Not on file.   Social History Main Topics  . Smoking status: Current Every Day Smoker -- 0.50 packs/day for 25 years    Types: Cigarettes  . Smokeless tobacco: Never Used     Comment: is on the Nictoine patch presently  . Alcohol Use: 0.0 oz/week    0 Standard drinks or equivalent per week     Comment: social  . Drug Use: Yes    Special: Marijuana  . Sexual Activity: Not on file   Other Topics Concern  . Not on file  Social History Narrative   Married. Works as an Adult nurse at [employer]. Has a daughter. Occasional ETOH.   No Known Allergies   Outpatient Prescriptions Prior to Visit  Medication Sig Dispense Refill  . amLODipine (NORVASC) 10 MG tablet Take 1 tablet (10 mg total) by mouth daily. 30 tablet 0  . amoxicillin-clavulanate (AUGMENTIN) 875-125 MG tablet Take 1 tablet by mouth 2 (two) times daily. 14 tablet 0  . fluticasone (FLONASE) 50 MCG/ACT nasal spray Place 2 sprays into both nostrils daily. 16 g 0  . ibuprofen (ADVIL,MOTRIN) 600 MG tablet Take 1 tablet by mouth as needed.  0  . loratadine-pseudoephedrine (CLARITIN-D 12-HOUR) 5-120 MG tablet Take 1 tablet  by mouth 2 (two) times daily.    Marland Kitchen terazosin (HYTRIN) 1 MG capsule Take 1 mg by mouth at bedtime.     No facility-administered medications prior to visit.   No orders of the defined types were placed in this encounter.           Objective:   Physical Exam  Vitals:  Filed Vitals:   07/01/15 1137  BP: 132/82  Pulse: 76  Height: 5\' 11"  (1.803 m)  Weight: 262 lb 12.8 oz (119.205 kg)  SpO2: 96%    Constitutional/General:  Pleasant, well-nourished, well-developed, not in any distress,  Comfortably seating.  Well kempt  Body mass index is 36.67 kg/(m^2). Wt Readings from Last 3 Encounters:  07/01/15 262 lb 12.8 oz (119.205 kg)  06/29/15 261 lb 1.6 oz (118.434 kg)  06/10/15 272 lb 6.4 oz (123.56 kg)    Neck circumference: 18.5 inches  HEENT: Pupils equal and reactive to light and accommodation. Anicteric sclerae. Normal nasal mucosa.   No oral  lesions,  mouth clear,  oropharynx clear, no postnasal drip. (-) Oral thrush. No dental caries.  Airway - Mallampati class III  Neck: No masses. Midline trachea. No JVD, (-) LAD. (-) bruits appreciated.  Respiratory/Chest: Grossly normal chest. (-) deformity. (-) Accessory muscle use.  Symmetric expansion. (-) Tenderness on palpation.  Resonant on percussion.  Diminished BS on both lower lung zones. (-) wheezing, crackles, rhonchi (-) egophony  Cardiovascular: Regular rate and  rhythm, heart sounds normal, no murmur or gallops, no peripheral edema  Gastrointestinal:  Normal bowel sounds. Soft, non-tender. No hepatosplenomegaly.  (-) masses.   Musculoskeletal:  Normal muscle tone. Normal gait.   Extremities: Grossly normal. (-) clubbing, cyanosis.  (-) edema  Skin: (-) rash,lesions seen.   Neurological/Psychiatric : alert, oriented to time, place, person. Normal mood and affect           Assessment & Plan:  OSA (obstructive sleep apnea) Patient was diagnosed with obstructive sleep apnea in 2007. He had a lab  study then which showed severe sleep apnea per his recollection. AHI 40-60 (?). Started on CPAP, 10 cm water. That was adjusted through the years.  He is significantly better with his CPAP machine. He has a current machine which is an auto CPAP from 11-14 cm. Current machine he got in 2014. He feels better using the CPAP machine. Less sleepiness. More energy. Less snoring.   He was seeing Dr. Marin Roberts in [place]. [place].  [facility]ME is [facility] in [place].  He wants a travel CPAP.  1. Continue cpap at 11-14 cm H2O. DL x 3 mos until 06/30/15  AHI 1, 99%. 2. Continue nasal pillows. 3. Advised patient to clean supplies frequently. 4. Current machine is 53 years old. It is still working  well. 4. Plan to order travel CPAP, 5-15 cm water, with nasal pillows. I mentioned to him that he will most likely need to pay out of pocket as it'll not be covered. He is okay with that. We'll send an order through his DME company in [place]. We will keep that DME company for now. If the travel CPAP does not work, I advised him to look online with [employer] whether he can get a portable battery for his CPAP.   Exertional dyspnea Has exertional dyspnea. Continues to smoke. Not too symptomatic. Advised on smoking cessation. May need PFT, chest x-ray if the dyspnea worsens.  Cough Has sinus issues. Continue Flonase 2 squirts per nostril at bedtime. Claritin as needed.  Obesity Advised on weight reduction.  Tobacco user Smoking cessation done.     Thank you very much for letting me participate in this patient's care. Please do not hesitate to give me a call if you have any questions or concerns regarding the treatment plan.   Patient will follow up with me in a year.     Monica Becton, MD [facility] Pager: [phone] Office: [phone], Fax: [phone]

## 2015-07-01 NOTE — Assessment & Plan Note (Signed)
Has exertional dyspnea. Continues to smoke. Not too symptomatic. Advised on smoking cessation. May need PFT, chest x-ray if the dyspnea worsens.

## 2015-07-06 ENCOUNTER — Telehealth: Payer: Self-pay | Admitting: Physician Assistant

## 2015-07-06 MED ORDER — AZELASTINE HCL 0.1 % NA SOLN: 1.0000 | Freq: Two times a day (BID) | NASAL | Status: DC

## 2015-07-06 NOTE — Telephone Encounter (Signed)
Spoke with patient. Med sent in. He will follow-up if not resolving.

## 2015-07-06 NOTE — Telephone Encounter (Signed)
Pt called in stating his sinuses have cleared up but his right ear is still closed up. He feels like it is swollen inside. I feels like it needs to pop but it won't. The left ear is a little swollen but not as bad. He isn't sure if he would need an appt or further medication. Please call to f/u [phone].

## 2015-07-06 NOTE — Telephone Encounter (Signed)
Most likely residual eustachian tube dysfunction giving the pressure and need to "pop" the ears. Continue the Flonase and Claritin. Ok to send in Rx for Astelin nasal spray to use 1 spray in each nostril twice daily. If symptoms not improving over the next couple of days or if anything worsens, he needs to come see me.

## 2015-07-22 ENCOUNTER — Encounter: Payer: Self-pay | Admitting: Physician Assistant

## 2015-07-22 ENCOUNTER — Ambulatory Visit (INDEPENDENT_AMBULATORY_CARE_PROVIDER_SITE_OTHER): Payer: Managed Care, Other (non HMO) | Admitting: Physician Assistant

## 2015-07-22 ENCOUNTER — Telehealth: Payer: Self-pay | Admitting: Physician Assistant

## 2015-07-22 VITALS — BP 140/82 | HR 61 | Temp 97.9°F | Ht 71.0 in | Wt 264.8 lb

## 2015-07-22 DIAGNOSIS — Z0000 Encounter for general adult medical examination without abnormal findings: Secondary | ICD-10-CM | POA: Diagnosis not present

## 2015-07-22 DIAGNOSIS — Z720 Tobacco use: Secondary | ICD-10-CM

## 2015-07-22 DIAGNOSIS — E669 Obesity, unspecified: Secondary | ICD-10-CM

## 2015-07-22 DIAGNOSIS — G4733 Obstructive sleep apnea (adult) (pediatric): Secondary | ICD-10-CM | POA: Diagnosis not present

## 2015-07-22 DIAGNOSIS — Z125 Encounter for screening for malignant neoplasm of prostate: Secondary | ICD-10-CM

## 2015-07-22 DIAGNOSIS — Z87891 Personal history of nicotine dependence: Secondary | ICD-10-CM

## 2015-07-22 DIAGNOSIS — I10 Essential (primary) hypertension: Secondary | ICD-10-CM | POA: Diagnosis not present

## 2015-07-22 LAB — URINALYSIS, ROUTINE W REFLEX MICROSCOPIC
Bilirubin Urine: NEGATIVE
Ketones, ur: NEGATIVE
Leukocytes, UA: NEGATIVE
Nitrite: NEGATIVE
PH: 6 (ref 5.0–8.0)
SPECIFIC GRAVITY, URINE: 1.02 (ref 1.000–1.030)
Total Protein, Urine: NEGATIVE
Urine Glucose: NEGATIVE
Urobilinogen, UA: 0.2 (ref 0.0–1.0)

## 2015-07-22 LAB — CBC
HEMATOCRIT: 47 % (ref 39.0–52.0)
Hemoglobin: 16 g/dL (ref 13.0–17.0)
MCHC: 34.1 g/dL (ref 30.0–36.0)
MCV: 89.9 fl (ref 78.0–100.0)
PLATELETS: 229 10*3/uL (ref 150.0–400.0)
RBC: 5.23 Mil/uL (ref 4.22–5.81)
RDW: 13 % (ref 11.5–15.5)
WBC: 7.4 10*3/uL (ref 4.0–10.5)

## 2015-07-22 LAB — COMPREHENSIVE METABOLIC PANEL
ALBUMIN: 4.7 g/dL (ref 3.5–5.2)
ALK PHOS: 59 U/L (ref 39–117)
ALT: 37 U/L (ref 0–53)
AST: 26 U/L (ref 0–37)
BILIRUBIN TOTAL: 2 mg/dL — AB (ref 0.2–1.2)
BUN: 16 mg/dL (ref 6–23)
CO2: 27 mEq/L (ref 19–32)
CREATININE: 1.27 mg/dL (ref 0.40–1.50)
Calcium: 9.8 mg/dL (ref 8.4–10.5)
Chloride: 105 mEq/L (ref 96–112)
GFR: 63.2 mL/min (ref >60.00)
Glucose, Bld: 111 mg/dL — ABNORMAL HIGH (ref 70–99)
Potassium: 3.7 mEq/L (ref 3.5–5.1)
Sodium: 140 mEq/L (ref 135–145)
TOTAL PROTEIN: 8 g/dL (ref 6.0–8.3)

## 2015-07-22 LAB — PSA: PSA: 2.45 ng/mL (ref 0.10–4.00)

## 2015-07-22 LAB — TSH: TSH: 1.35 u[IU]/mL (ref 0.35–4.50)

## 2015-07-22 LAB — LIPID PANEL
CHOL/HDL RATIO: 5
CHOLESTEROL: 181 mg/dL (ref 0–200)
HDL: 39.7 mg/dL (ref >39.00)
LDL CALC: 104 mg/dL — AB (ref 0–99)
NonHDL: 140.88
TRIGLYCERIDES: 183 mg/dL — AB (ref 0.0–149.0)
VLDL: 36.6 mg/dL (ref 0.0–40.0)

## 2015-07-22 LAB — HEMOGLOBIN A1C: Hgb A1c MFr Bld: 5 % (ref 4.6–6.5)

## 2015-07-22 MED ORDER — AZELASTINE HCL 0.1 % NA SOLN: 1.0000 | Freq: Two times a day (BID) | NASAL | Status: DC

## 2015-07-22 MED ORDER — FLUTICASONE PROPIONATE 50 MCG/ACT NA SUSP: 2.0000 | Freq: Every day | NASAL | Status: DC

## 2015-07-22 MED ORDER — TERAZOSIN HCL 1 MG PO CAPS: 1.0000 mg | ORAL_CAPSULE | Freq: Every day | ORAL | Status: DC

## 2015-07-22 MED ORDER — TADALAFIL 10 MG PO TABS: 10.0000 mg | ORAL_TABLET | Freq: Every day | ORAL | Status: DC | PRN

## 2015-07-22 MED ORDER — AMLODIPINE BESYLATE 10 MG PO TABS: 10.0000 mg | ORAL_TABLET | Freq: Every day | ORAL | Status: DC

## 2015-07-22 NOTE — Patient Instructions (Signed)
Please go to the lab for blood work.  I will call you with your results. If your blood work is normal we will follow-up yearly for physicals anc every 6 months for BP. If anything is abnormal we will alter your regimen.   DASH Eating Plan DASH stands for "Dietary Approaches to Stop Hypertension." The DASH eating plan is a healthy eating plan that has been shown to reduce high blood pressure (hypertension). Additional health benefits may include reducing the risk of type 2 diabetes mellitus, heart disease, and stroke. The DASH eating plan may also help with weight loss. WHAT DO I NEED TO KNOW ABOUT THE DASH EATING PLAN? For the DASH eating plan, you will follow these general guidelines:  Choose foods with a percent daily value for sodium of less than 5% (as listed on the food label).  Use salt-free seasonings or herbs instead of table salt or sea salt.  Check with your health care provider or pharmacist before using salt substitutes.  Eat lower-sodium products, often labeled as "lower sodium" or "no salt added."  Eat fresh foods.  Eat more vegetables, fruits, and low-fat dairy products.  Choose whole grains. Look for the word "whole" as the first word in the ingredient list.  Choose fish and skinless chicken or [place] more often than red meat. Limit fish, poultry, and meat to 6 oz (170 g) each day.  Limit sweets, desserts, sugars, and sugary drinks.  Choose heart-healthy fats.  Limit cheese to 1 oz (28 g) per day.  Eat more home-cooked food and less restaurant, buffet, and fast food.  Limit fried foods.  Cook foods using methods other than frying.  Limit canned vegetables. If you do use them, rinse them well to decrease the sodium.  When eating at a restaurant, ask that your food be prepared with less salt, or no salt if possible. WHAT FOODS CAN I EAT? Seek help from a dietitian for individual calorie needs. Grains Whole grain or whole wheat bread. Brown rice. Whole grain  or whole wheat pasta. Quinoa, bulgur, and whole grain cereals. Low-sodium cereals. Corn or whole wheat flour tortillas. Whole grain cornbread. Whole grain crackers. Low-sodium crackers. Vegetables Fresh or frozen vegetables (raw, steamed, roasted, or grilled). Low-sodium or reduced-sodium tomato and vegetable juices. Low-sodium or reduced-sodium tomato sauce and paste. Low-sodium or reduced-sodium canned vegetables.  Fruits All fresh, canned (in natural juice), or frozen fruits. Meat and Other Protein Products Ground beef (85% or leaner), grass-fed beef, or beef trimmed of fat. Skinless chicken or [place]. Ground chicken or [place]. Pork trimmed of fat. All fish and seafood. Eggs. Dried beans, peas, or lentils. Unsalted nuts and seeds. Unsalted canned beans. Dairy Low-fat dairy products, such as skim or 1% milk, 2% or reduced-fat cheeses, low-fat ricotta or cottage cheese, or plain low-fat yogurt. Low-sodium or reduced-sodium cheeses. Fats and Oils Tub margarines without trans fats. Light or reduced-fat mayonnaise and salad dressings (reduced sodium). Avocado. Safflower, olive, or canola oils. Natural peanut or almond butter. Other Unsalted popcorn and pretzels. The items listed above may not be a complete list of recommended foods or beverages. Contact your dietitian for more options. WHAT FOODS ARE NOT RECOMMENDED? Grains White bread. White pasta. White rice. Refined cornbread. Bagels and croissants. Crackers that contain trans fat. Vegetables Creamed or fried vegetables. Vegetables in a cheese sauce. Regular canned vegetables. Regular canned tomato sauce and paste. Regular tomato and vegetable juices. Fruits Dried fruits. Canned fruit in light or heavy syrup. Fruit juice. Meat and Other  Protein Products Fatty cuts of meat. Ribs, chicken wings, bacon, sausage, bologna, salami, chitterlings, fatback, hot dogs, bratwurst, and packaged luncheon meats. Salted nuts and seeds. Canned beans with  salt. Dairy Whole or 2% milk, cream, half-and-half, and cream cheese. Whole-fat or sweetened yogurt. Full-fat cheeses or blue cheese. Nondairy creamers and whipped toppings. Processed cheese, cheese spreads, or cheese curds. Condiments Onion and garlic salt, seasoned salt, table salt, and sea salt. Canned and packaged gravies. Worcestershire sauce. Tartar sauce. Barbecue sauce. Teriyaki sauce. Soy sauce, including reduced sodium. Steak sauce. Fish sauce. Oyster sauce. Cocktail sauce. Horseradish. Ketchup and mustard. Meat flavorings and tenderizers. Bouillon cubes. Hot sauce. Tabasco sauce. Marinades. Taco seasonings. Relishes. Fats and Oils Butter, stick margarine, lard, shortening, ghee, and bacon fat. Coconut, palm kernel, or palm oils. Regular salad dressings. Other Pickles and olives. Salted popcorn and pretzels. The items listed above may not be a complete list of foods and beverages to avoid. Contact your dietitian for more information. WHERE CAN I FIND MORE INFORMATION? National Heart, Lung, and Blood Institute: travelstabloid.com   This information is not intended to replace advice given to you by your health care provider. Make sure you discuss any questions you have with your health care provider.   Document Released: 04/07/2011 Document Revised: 05/09/2014 Document Reviewed: 02/20/2013 Elsevier Interactive Patient Education 2016 [place] for Adults, Male A healthy lifestyle and preventive care can promote health and wellness. Preventive health guidelines for men include the following key practices:  A routine yearly physical is a good way to check with your health care provider about your health and preventative screening. It is a chance to share any concerns and updates on your health and to receive a thorough exam.  Visit your dentist for a routine exam and preventative care every 6 months. Brush your teeth twice a day and  floss once a day. Good oral hygiene prevents tooth decay and gum disease.  The frequency of eye exams is based on your age, health, family medical history, use of contact lenses, and other factors. Follow your health care provider's recommendations for frequency of eye exams.  Eat a healthy diet. Foods such as vegetables, fruits, whole grains, low-fat dairy products, and lean protein foods contain the nutrients you need without too many calories. Decrease your intake of foods high in solid fats, added sugars, and salt. Eat the right amount of calories for you.Get information about a proper diet from your health care provider, if necessary.  Regular physical exercise is one of the most important things you can do for your health. Most adults should get at least 150 minutes of moderate-intensity exercise (any activity that increases your heart rate and causes you to sweat) each week. In addition, most adults need muscle-strengthening exercises on 2 or more days a week.  Maintain a healthy weight. The body mass index (BMI) is a screening tool to identify possible weight problems. It provides an estimate of body fat based on height and weight. Your health care provider can find your BMI and can help you achieve or maintain a healthy weight.For adults 20 years and older:  A BMI below 18.5 is considered underweight.  A BMI of 18.5 to 24.9 is normal.  A BMI of 25 to 29.9 is considered overweight.  A BMI of 30 and above is considered obese.  Maintain normal blood lipids and cholesterol levels by exercising and minimizing your intake of saturated fat. Eat a balanced diet with plenty of fruit and  vegetables. Blood tests for lipids and cholesterol should begin at age 43 and be repeated every 5 years. If your lipid or cholesterol levels are high, you are over 50, or you are at high risk for heart disease, you may need your cholesterol levels checked more frequently.Ongoing high lipid and cholesterol levels  should be treated with medicines if diet and exercise are not working.  If you smoke, find out from your health care provider how to quit. If you do not use tobacco, do not start.  Lung cancer screening is recommended for adults aged 56-80 years who are at high risk for developing lung cancer because of a history of smoking. A yearly low-dose CT scan of the lungs is recommended for people who have at least a 30-pack-year history of smoking and are a current smoker or have quit within the past 15 years. A pack year of smoking is smoking an average of 1 pack of cigarettes a day for 1 year (for example: 1 pack a day for 30 years or 2 packs a day for 15 years). Yearly screening should continue until the smoker has stopped smoking for at least 15 years. Yearly screening should be stopped for people who develop a health problem that would prevent them from having lung cancer treatment.  If you choose to drink alcohol, do not have more than 2 drinks per day. One drink is considered to be 12 ounces (355 mL) of beer, 5 ounces (148 mL) of wine, or 1.5 ounces (44 mL) of liquor.  Avoid use of street drugs. Do not share needles with anyone. Ask for help if you need support or instructions about stopping the use of drugs.  High blood pressure causes heart disease and increases the risk of stroke. Your blood pressure should be checked at least every 1-2 years. Ongoing high blood pressure should be treated with medicines, if weight loss and exercise are not effective.  If you are 58-87 years old, ask your health care provider if you should take aspirin to prevent heart disease.  Diabetes screening is done by taking a blood sample to check your blood glucose level after you have not eaten for a certain period of time (fasting). If you are not overweight and you do not have risk factors for diabetes, you should be screened once every 3 years starting at age 18. If you are overweight or obese and you are 40-70 years of  age, you should be screened for diabetes every year as part of your cardiovascular risk assessment.  Colorectal cancer can be detected and often prevented. Most routine colorectal cancer screening begins at the age of 11 and continues through age 4. However, your health care provider may recommend screening at an earlier age if you have risk factors for colon cancer. On a yearly basis, your health care provider may provide home test kits to check for hidden blood in the stool. Use of a small camera at the end of a tube to directly examine the colon (sigmoidoscopy or colonoscopy) can detect the earliest forms of colorectal cancer. Talk to your health care provider about this at age 69, when routine screening begins. Direct exam of the colon should be repeated every 5-10 years through age 16, unless early forms of precancerous polyps or small growths are found.  People who are at an increased risk for hepatitis B should be screened for this virus. You are considered at high risk for hepatitis B if:  You were born in a  country where hepatitis B occurs often. Talk with your health care provider about which countries are considered high risk.  Your parents were born in a high-risk country and you have not received a shot to protect against hepatitis B (hepatitis B vaccine).  You have HIV or AIDS.  You use needles to inject street drugs.  You live with, or have sex with, someone who has hepatitis B.  You are a man who has sex with other men (MSM).  You get hemodialysis treatment.  You take certain medicines for conditions such as cancer, organ transplantation, and autoimmune conditions.  Hepatitis C blood testing is recommended for all people born from [birth year] through 1965 and any individual with known risks for hepatitis C.  Practice safe sex. Use condoms and avoid high-risk sexual practices to reduce the spread of sexually transmitted infections (STIs). STIs include gonorrhea, chlamydia, syphilis,  trichomonas, herpes, HPV, and human immunodeficiency virus (HIV). Herpes, HIV, and HPV are viral illnesses that have no cure. They can result in disability, cancer, and death.  If you are a man who has sex with other men, you should be screened at least once per year for:  HIV.  Urethral, rectal, and pharyngeal infection of gonorrhea, chlamydia, or both.  If you are at risk of being infected with HIV, it is recommended that you take a prescription medicine daily to prevent HIV infection. This is called preexposure prophylaxis (PrEP). You are considered at risk if:  You are a man who has sex with other men (MSM) and have other risk factors.  You are a heterosexual man, are sexually active, and are at increased risk for HIV infection.  You take drugs by injection.  You are sexually active with a partner who has HIV.  Talk with your health care provider about whether you are at high risk of being infected with HIV. If you choose to begin PrEP, you should first be tested for HIV. You should then be tested every 3 months for as long as you are taking PrEP.  A one-time screening for abdominal aortic aneurysm (AAA) and surgical repair of large AAAs by ultrasound are recommended for men ages 50 to 4 years who are current or former smokers.  Healthy men should no longer receive prostate-specific antigen (PSA) blood tests as part of routine cancer screening. Talk with your health care provider about prostate cancer screening.  Testicular cancer screening is not recommended for adult males who have no symptoms. Screening includes self-exam, a health care provider exam, and other screening tests. Consult with your health care provider about any symptoms you have or any concerns you have about testicular cancer.  Use sunscreen. Apply sunscreen liberally and repeatedly throughout the day. You should seek shade when your shadow is shorter than you. Protect yourself by wearing long sleeves, pants, a  wide-brimmed hat, and sunglasses year round, whenever you are outdoors.  Once a month, do a whole-body skin exam, using a mirror to look at the skin on your back. Tell your health care provider about new moles, moles that have irregular borders, moles that are larger than a pencil eraser, or moles that have changed in shape or color.  Stay current with required vaccines (immunizations).  Influenza vaccine. All adults should be immunized every year.  Tetanus, diphtheria, and acellular pertussis (Td, Tdap) vaccine. An adult who has not previously received Tdap or who does not know his vaccine status should receive 1 dose of Tdap. This initial dose should be followed  by tetanus and diphtheria toxoids (Td) booster doses every 10 years. Adults with an unknown or incomplete history of completing a 3-dose immunization series with Td-containing vaccines should begin or complete a primary immunization series including a Tdap dose. Adults should receive a Td booster every 10 years.  Varicella vaccine. An adult without evidence of immunity to varicella should receive 2 doses or a second dose if he has previously received 1 dose.  Human papillomavirus (HPV) vaccine. Males aged 11-21 years who have not received the vaccine previously should receive the 3-dose series. Males aged 22-26 years may be immunized. Immunization is recommended through the age of 5 years for any male who has sex with males and did not get any or all doses earlier. Immunization is recommended for any person with an immunocompromised condition through the age of 76 years if he did not get any or all doses earlier. During the 3-dose series, the second dose should be obtained 4-8 weeks after the first dose. The third dose should be obtained 24 weeks after the first dose and 16 weeks after the second dose.  Zoster vaccine. One dose is recommended for adults aged 35 years or older unless certain conditions are present.  Measles, mumps, and  rubella (MMR) vaccine. Adults born before 60 generally are considered immune to measles and mumps. Adults born in [birth year] or later should have 1 or more doses of MMR vaccine unless there is a contraindication to the vaccine or there is laboratory evidence of immunity to each of the three diseases. A routine second dose of MMR vaccine should be obtained at least 28 days after the first dose for students attending postsecondary schools, health care workers, or international travelers. People who received inactivated measles vaccine or an unknown type of measles vaccine during 1963-1967 should receive 2 doses of MMR vaccine. People who received inactivated mumps vaccine or an unknown type of mumps vaccine before 1979 and are at high risk for mumps infection should consider immunization with 2 doses of MMR vaccine. Unvaccinated health care workers born before [birth year] who lack laboratory evidence of measles, mumps, or rubella immunity or laboratory confirmation of disease should consider measles and mumps immunization with 2 doses of MMR vaccine or rubella immunization with 1 dose of MMR vaccine.  Pneumococcal 13-valent conjugate (PCV13) vaccine. When indicated, a person who is uncertain of his immunization history and has no record of immunization should receive the PCV13 vaccine. All adults 81 years of age and older should receive this vaccine. An adult aged 24 years or older who has certain medical conditions and has not been previously immunized should receive 1 dose of PCV13 vaccine. This PCV13 should be followed with a dose of pneumococcal polysaccharide (PPSV23) vaccine. Adults who are at high risk for pneumococcal disease should obtain the PPSV23 vaccine at least 8 weeks after the dose of PCV13 vaccine. Adults older than 53 years of age who have normal immune system function should obtain the PPSV23 vaccine dose at least 1 year after the dose of PCV13 vaccine.  Pneumococcal polysaccharide (PPSV23) vaccine. When  PCV13 is also indicated, PCV13 should be obtained first. All adults aged 44 years and older should be immunized. An adult younger than age 20 years who has certain medical conditions should be immunized. Any person who resides in a nursing home or long-term care facility should be immunized. An adult smoker should be immunized. People with an immunocompromised condition and certain other conditions should receive both PCV13 and PPSV23 vaccines. People  with human immunodeficiency virus (HIV) infection should be immunized as soon as possible after diagnosis. Immunization during chemotherapy or radiation therapy should be avoided. Routine use of PPSV23 vaccine is not recommended for American Indians, [place] Natives, or people younger than 65 years unless there are medical conditions that require PPSV23 vaccine. When indicated, people who have unknown immunization and have no record of immunization should receive PPSV23 vaccine. One-time revaccination 5 years after the first dose of PPSV23 is recommended for people aged 19-64 years who have chronic kidney failure, nephrotic syndrome, asplenia, or immunocompromised conditions. People who received 1-2 doses of PPSV23 before age 61 years should receive another dose of PPSV23 vaccine at age 37 years or later if at least 5 years have passed since the previous dose. Doses of PPSV23 are not needed for people immunized with PPSV23 at or after age 58 years.  Meningococcal vaccine. Adults with asplenia or persistent complement component deficiencies should receive 2 doses of quadrivalent meningococcal conjugate (MenACWY-D) vaccine. The doses should be obtained at least 2 months apart. Microbiologists working with certain meningococcal bacteria, [place] recruits, people at risk during an outbreak, and people who travel to or live in countries with a high rate of meningitis should be immunized. A first-year college student up through age 70 years who is living in a residence  hall should receive a dose if he did not receive a dose on or after his 16th birthday. Adults who have certain high-risk conditions should receive one or more doses of vaccine.  Hepatitis A vaccine. Adults who wish to be protected from this disease, have chronic liver disease, work with hepatitis A-infected animals, work in hepatitis A research labs, or travel to or work in countries with a high rate of hepatitis A should be immunized. Adults who were previously unvaccinated and who anticipate close contact with an international adoptee during the first 60 days after arrival in the [place] from a country with a high rate of hepatitis A should be immunized.  Hepatitis B vaccine. Adults should be immunized if they wish to be protected from this disease, are under age 27 years and have diabetes, have chronic liver disease, have had more than one sex partner in the past 6 months, may be exposed to blood or other infectious body fluids, are household contacts or sex partners of hepatitis B positive people, are clients or workers in certain care facilities, or travel to or work in countries with a high rate of hepatitis B.  Haemophilus influenzae type b (Hib) vaccine. A previously unvaccinated person with asplenia or sickle cell disease or having a scheduled splenectomy should receive 1 dose of Hib vaccine. Regardless of previous immunization, a recipient of a hematopoietic stem cell transplant should receive a 3-dose series 6-12 months after his successful transplant. Hib vaccine is not recommended for adults with HIV infection. Preventive Service / Frequency Ages 55 to 51  Blood pressure check.** / Every 3-5 years.  Lipid and cholesterol check.** / Every 5 years beginning at age 44.  Hepatitis C blood test.** / For any individual with known risks for hepatitis C.  Skin self-exam. / Monthly.  Influenza vaccine. / Every year.  Tetanus, diphtheria, and acellular pertussis (Tdap, Td) vaccine.** /  Consult your health care provider. 1 dose of Td every 10 years.  Varicella vaccine.** / Consult your health care provider.  HPV vaccine. / 3 doses over 6 months, if 53 or younger.  Measles, mumps, rubella (MMR) vaccine.** / You need at least  1 dose of MMR if you were born in 1957 or later. You may also need a second dose.  Pneumococcal 13-valent conjugate (PCV13) vaccine.** / Consult your health care provider.  Pneumococcal polysaccharide (PPSV23) vaccine.** / 1 to 2 doses if you smoke cigarettes or if you have certain conditions.  Meningococcal vaccine.** / 1 dose if you are age 66 to 74 years and a Market researcher living in a residence hall, or have one of several medical conditions. You may also need additional booster doses.  Hepatitis A vaccine.** / Consult your health care provider.  Hepatitis B vaccine.** / Consult your health care provider.  Haemophilus influenzae type b (Hib) vaccine.** / Consult your health care provider. Ages 88 to 70  Blood pressure check.** / Every year.  Lipid and cholesterol check.** / Every 5 years beginning at age 47.  Lung cancer screening. / Every year if you are aged 72-80 years and have a 30-pack-year history of smoking and currently smoke or have quit within the past 15 years. Yearly screening is stopped once you have quit smoking for at least 15 years or develop a health problem that would prevent you from having lung cancer treatment.  Fecal occult blood test (FOBT) of stool. / Every year beginning at age 7 and continuing until age 82. You may not have to do this test if you get a colonoscopy every 10 years.  Flexible sigmoidoscopy** or colonoscopy.** / Every 5 years for a flexible sigmoidoscopy or every 10 years for a colonoscopy beginning at age 22 and continuing until age 107.  Hepatitis C blood test.** / For all people born from [birth year] through 1965 and any individual with known risks for hepatitis C.  Skin self-exam. /  Monthly.  Influenza vaccine. / Every year.  Tetanus, diphtheria, and acellular pertussis (Tdap/Td) vaccine.** / Consult your health care provider. 1 dose of Td every 10 years.  Varicella vaccine.** / Consult your health care provider.  Zoster vaccine.** / 1 dose for adults aged 66 years or older.  Measles, mumps, rubella (MMR) vaccine.** / You need at least 1 dose of MMR if you were born in 1957 or later. You may also need a second dose.  Pneumococcal 13-valent conjugate (PCV13) vaccine.** / Consult your health care provider.  Pneumococcal polysaccharide (PPSV23) vaccine.** / 1 to 2 doses if you smoke cigarettes or if you have certain conditions.  Meningococcal vaccine.** / Consult your health care provider.  Hepatitis A vaccine.** / Consult your health care provider.  Hepatitis B vaccine.** / Consult your health care provider.  Haemophilus influenzae type b (Hib) vaccine.** / Consult your health care provider. Ages 68 and over  Blood pressure check.** / Every year.  Lipid and cholesterol check.**/ Every 5 years beginning at age 74.  Lung cancer screening. / Every year if you are aged 82-80 years and have a 30-pack-year history of smoking and currently smoke or have quit within the past 15 years. Yearly screening is stopped once you have quit smoking for at least 15 years or develop a health problem that would prevent you from having lung cancer treatment.  Fecal occult blood test (FOBT) of stool. / Every year beginning at age 50 and continuing until age 47. You may not have to do this test if you get a colonoscopy every 10 years.  Flexible sigmoidoscopy** or colonoscopy.** / Every 5 years for a flexible sigmoidoscopy or every 10 years for a colonoscopy beginning at age 17 and continuing until age 33.  Hepatitis C blood test.** / For all people born from [birth year] through 1963 and any individual with known risks for hepatitis C.  Abdominal aortic aneurysm (AAA) screening.** / A  one-time screening for ages 65 to 6 years who are current or former smokers.  Skin self-exam. / Monthly.  Influenza vaccine. / Every year.  Tetanus, diphtheria, and acellular pertussis (Tdap/Td) vaccine.** / 1 dose of Td every 10 years.  Varicella vaccine.** / Consult your health care provider.  Zoster vaccine.** / 1 dose for adults aged 67 years or older.  Pneumococcal 13-valent conjugate (PCV13) vaccine.** / 1 dose for all adults aged 23 years and older.  Pneumococcal polysaccharide (PPSV23) vaccine.** / 1 dose for all adults aged 15 years and older.  Meningococcal vaccine.** / Consult your health care provider.  Hepatitis A vaccine.** / Consult your health care provider.  Hepatitis B vaccine.** / Consult your health care provider.  Haemophilus influenzae type b (Hib) vaccine.** / Consult your health care provider. **Family history and personal history of risk and conditions may change your health care provider's recommendations.   This information is not intended to replace advice given to you by your health care provider. Make sure you discuss any questions you have with your health care provider.   Document Released: 06/14/2001 Document Revised: 05/09/2014 Document Reviewed: 09/13/2010 Elsevier Interactive Patient Education [employer].

## 2015-07-22 NOTE — Progress Notes (Signed)
Pre visit review using our clinic review tool, if applicable. No additional management support is needed unless otherwise documented below in the visit note. 

## 2015-07-22 NOTE — Telephone Encounter (Signed)
Faxed medical request form to [facility] Dr. Billey Gosling office.

## 2015-07-22 NOTE — Progress Notes (Signed)
Patient presents to clinic today for annual exam.  Patient is fasting for labs.  Acute Concerns: Denies acute concerns today.  Chronic Issues: Hypertension -- Is taking amlodipine and hytrin as directed. Is tolerating medication well. Patient denies chest pain, palpitations, lightheadedness, dizziness, vision changes or frequent headaches. Is working on exercise regimen to promote weight loss. Is trying to cut down fat and sugar content of diet. Body mass index is 36.95 kg/(m^2).   BP Readings from Last 3 Encounters:  07/22/15 140/82  07/01/15 132/82  06/29/15 148/81    OSA -- Is wearing CPAP nightly as directed. Has seen Sleep Medicine for follow-up Corrie Dandy, MD). Was told apneic episodes are resolved with CPAP therapy. Is resting well at night.   Tobacco Use -- Patient has stopped smoking completely!  Health Maintenance: Immunizations -- immunizations are up-to-date. Colonoscopy -- Is due for repeat due to history of polyps. Is scheduling appointment with [facility].  Past Medical History  Diagnosis Date  . Hypertension   . Allergy     Seasonal  . Sleep apnea     CPAP nightly  . Heart murmur     Asymptomatic -- Patient endorses previous negative evaluation  . Chicken pox     Past Surgical History  Procedure Laterality Date  . No past surgeries      Current Outpatient Prescriptions on File Prior to Visit  Medication Sig Dispense Refill  . ibuprofen (ADVIL,MOTRIN) 600 MG tablet Take 1 tablet by mouth as needed.  0   No current facility-administered medications on file prior to visit.    No Known Allergies  Family History  Problem Relation Age of Onset  . Heart attack Father 49  . Heart attack Paternal Uncle 44  . Heart attack Paternal Grandfather 60  . Cancer Maternal Grandmother 63    Unsure  . Alcohol abuse Sister     Social History   Social History  . Marital Status: Married    Spouse Name: N/A  . Number of Children: N/A  . Years of Education:  N/A   Occupational History  . Not on file.   Social History Main Topics  . Smoking status: Former Smoker -- 0.50 packs/day for 25 years    Types: Cigarettes    Quit date: 06/24/2015  . Smokeless tobacco: Never Used     Comment: is on the Nictoine patch presently  . Alcohol Use: 0.0 oz/week    0 Standard drinks or equivalent per week     Comment: social  . Drug Use: Yes    Special: Marijuana  . Sexual Activity: Not on file   Other Topics Concern  . Not on file   Social History Narrative   Review of Systems  Constitutional: Negative for fever and weight loss.  HENT: Negative for ear discharge, ear pain, hearing loss and tinnitus.   Eyes: Negative for blurred vision, double vision, photophobia and pain.  Respiratory: Negative for cough and shortness of breath.   Cardiovascular: Negative for chest pain and palpitations.  Gastrointestinal: Negative for heartburn, nausea, vomiting, abdominal pain, diarrhea, constipation, blood in stool and melena.  Genitourinary: Negative for dysuria, urgency, frequency, hematuria and flank pain.  Musculoskeletal: Negative for falls.  Neurological: Negative for dizziness, loss of consciousness and headaches.  Endo/Heme/Allergies: Negative for environmental allergies.  Psychiatric/Behavioral: Negative for depression, suicidal ideas, hallucinations and substance abuse. The patient is not nervous/anxious and does not have insomnia.     BP 140/82 mmHg  Pulse 61  Temp(Src) 97.9 F (36.6 C) (Oral)  Ht '5\' 11"'$  (1.803 m)  Wt 264 lb 12.8 oz (120.112 kg)  BMI 36.95 kg/m2  SpO2 98%  Physical Exam  Constitutional: He is oriented to person, place, and time and well-developed, well-nourished, and in no distress.  HENT:  Head: Normocephalic and atraumatic.  Right Ear: External ear normal.  Left Ear: External ear normal.  Nose: Nose normal.  Mouth/Throat: Oropharynx is clear and moist. No oropharyngeal exudate.  Eyes: Conjunctivae and EOM are normal.  Pupils are equal, round, and reactive to light.  Neck: Neck supple. No thyromegaly present.  Cardiovascular: Normal rate, regular rhythm, normal heart sounds and intact distal pulses.   Pulmonary/Chest: Effort normal and breath sounds normal. No respiratory distress. He has no wheezes. He has no rales. He exhibits no tenderness.  Abdominal: Soft. Bowel sounds are normal. He exhibits no distension and no mass. There is no tenderness. There is no rebound and no guarding.  Genitourinary: Testes/scrotum normal.  Lymphadenopathy:    He has no cervical adenopathy.  Neurological: He is alert and oriented to person, place, and time.  Skin: Skin is warm and dry. No rash noted.  Psychiatric: Affect normal.  Vitals reviewed.   Recent Results (from the past 2160 hour(s))  CBC     Status: None   Collection Time: 07/22/15  9:31 AM  Result Value Ref Range   WBC 7.4 4.0 - 10.5 K/uL   RBC 5.23 4.22 - 5.81 Mil/uL   Platelets 229.0 150.0 - 400.0 K/uL   Hemoglobin 16.0 13.0 - 17.0 g/dL   HCT 47.0 39.0 - 52.0 %   MCV 89.9 78.0 - 100.0 fl   MCHC 34.1 30.0 - 36.0 g/dL   RDW 13.0 11.5 - 15.5 %  Comp Met (CMET)     Status: Abnormal   Collection Time: 07/22/15  9:31 AM  Result Value Ref Range   Sodium 140 135 - 145 mEq/L   Potassium 3.7 3.5 - 5.1 mEq/L   Chloride 105 96 - 112 mEq/L   CO2 27 19 - 32 mEq/L   Glucose, Bld 111 (H) 70 - 99 mg/dL   BUN 16 6 - 23 mg/dL   Creatinine, Ser 1.27 0.40 - 1.50 mg/dL   Total Bilirubin 2.0 (H) 0.2 - 1.2 mg/dL   Alkaline Phosphatase 59 39 - 117 U/L   AST 26 0 - 37 U/L   ALT 37 0 - 53 U/L   Total Protein 8.0 6.0 - 8.3 g/dL   Albumin 4.7 3.5 - 5.2 g/dL   Calcium 9.8 8.4 - 10.5 mg/dL   GFR 63.20 >60.00 mL/min  TSH     Status: None   Collection Time: 07/22/15  9:31 AM  Result Value Ref Range   TSH 1.35 0.35 - 4.50 uIU/mL  Hemoglobin A1c     Status: None   Collection Time: 07/22/15  9:31 AM  Result Value Ref Range   Hgb A1c MFr Bld 5.0 4.6 - 6.5 %    Comment:  Glycemic Control Guidelines for People with Diabetes:Non Diabetic:  <6%Goal of Therapy: <7%Additional Action Suggested:  >8%   Urinalysis, Routine w reflex microscopic     Status: Abnormal   Collection Time: 07/22/15  9:31 AM  Result Value Ref Range   Color, Urine YELLOW Yellow;Lt. Yellow   APPearance CLEAR Clear   Specific Gravity, Urine 1.020 1.000-1.030   pH 6.0 5.0 - 8.0   Total Protein, Urine NEGATIVE Negative   Urine Glucose NEGATIVE Negative  Ketones, ur NEGATIVE Negative   Bilirubin Urine NEGATIVE Negative   Hgb urine dipstick TRACE-INTACT (A) Negative   Urobilinogen, UA 0.2 0.0 - 1.0   Leukocytes, UA NEGATIVE Negative   Nitrite NEGATIVE Negative   WBC, UA 0-2/hpf 0-2/hpf   RBC / HPF 0-2/hpf 0-2/hpf   Squamous Epithelial / LPF Rare(0-4/hpf) Rare(0-4/hpf)  Lipid Profile     Status: Abnormal   Collection Time: 07/22/15  9:31 AM  Result Value Ref Range   Cholesterol 181 0 - 200 mg/dL    Comment: ATP III Classification       Desirable:  < 200 mg/dL               Borderline High:  200 - 239 mg/dL          High:  > = 240 mg/dL   Triglycerides 183.0 (H) 0.0 - 149.0 mg/dL    Comment: Normal:  <150 mg/dLBorderline High:  150 - 199 mg/dL   HDL 39.70 >39.00 mg/dL   VLDL 36.6 0.0 - 40.0 mg/dL   LDL Cholesterol 104 (H) 0 - 99 mg/dL   Total CHOL/HDL Ratio 5     Comment:                Men          Women1/2 Average Risk     3.4          3.3Average Risk          5.0          4.42X Average Risk          9.6          7.13X Average Risk          15.0          11.0                       NonHDL 140.88     Comment: NOTE:  Non-HDL goal should be 30 mg/dL higher than patient's LDL goal (i.e. LDL goal of < 70 mg/dL, would have non-HDL goal of < 100 mg/dL)  PSA     Status: None   Collection Time: 07/22/15  9:31 AM  Result Value Ref Range   PSA 2.45 0.10 - 4.00 ng/mL    Assessment/Plan: Essential hypertension, benign Stable. Continue current regimen. Will obtain lab panel today.  OSA  (obstructive sleep apnea) Endorses using CPAP as directed. Continue therapy and follow-up with Pulmonology as scheduled.  Obesity Diet and exercise recommendations reviewed. Will continue to monitor.  Quit smoking Patient has stopped smoking which is to be commended.  Visit for preventive health examination Depression screen negative. Health Maintenance reviewed -- Immunizations up-to-date. Will obtain PSA today for prostate cancer screening. Patient is scheduling his repeat colonoscopy. Preventive schedule discussed and handout given in AVS. Will obtain fasting labs today.

## 2015-07-24 ENCOUNTER — Encounter: Payer: Self-pay | Admitting: Gastroenterology

## 2015-07-26 DIAGNOSIS — Z0000 Encounter for general adult medical examination without abnormal findings: Secondary | ICD-10-CM | POA: Insufficient documentation

## 2015-07-26 NOTE — Assessment & Plan Note (Signed)
Depression screen negative. Health Maintenance reviewed -- Immunizations up-to-date. Will obtain PSA today for prostate cancer screening. Patient is scheduling his repeat colonoscopy. Preventive schedule discussed and handout given in AVS. Will obtain fasting labs today.

## 2015-07-26 NOTE — Assessment & Plan Note (Signed)
Endorses using CPAP as directed. Continue therapy and follow-up with Pulmonology as scheduled.

## 2015-07-26 NOTE — Assessment & Plan Note (Signed)
Diet and exercise recommendations reviewed. Will continue to monitor.

## 2015-07-26 NOTE — Assessment & Plan Note (Signed)
Stable. Continue current regimen. Will obtain lab panel today.

## 2015-07-26 NOTE — Assessment & Plan Note (Signed)
Patient has stopped smoking which is to be commended.

## 2015-07-27 ENCOUNTER — Encounter: Payer: Self-pay | Admitting: Physician Assistant

## 2015-07-27 ENCOUNTER — Other Ambulatory Visit: Payer: Self-pay | Admitting: Physician Assistant

## 2015-07-27 DIAGNOSIS — R17 Unspecified jaundice: Secondary | ICD-10-CM

## 2015-07-27 MED ORDER — FLUTICASONE PROPIONATE 50 MCG/ACT NA SUSP: 2.0000 | Freq: Every day | NASAL | Status: DC

## 2015-08-21 ENCOUNTER — Other Ambulatory Visit (INDEPENDENT_AMBULATORY_CARE_PROVIDER_SITE_OTHER): Payer: Managed Care, Other (non HMO)

## 2015-08-21 ENCOUNTER — Telehealth: Payer: Self-pay | Admitting: Pulmonary Disease

## 2015-08-21 DIAGNOSIS — G4733 Obstructive sleep apnea (adult) (pediatric): Secondary | ICD-10-CM

## 2015-08-21 DIAGNOSIS — R17 Unspecified jaundice: Secondary | ICD-10-CM | POA: Diagnosis not present

## 2015-08-21 LAB — BILIRUBIN, TOTAL: BILIRUBIN TOTAL: 1.2 mg/dL (ref 0.2–1.2)

## 2015-08-21 LAB — BILIRUBIN, DIRECT: Bilirubin, Direct: 0.2 mg/dL (ref 0.0–0.3)

## 2015-08-21 NOTE — Telephone Encounter (Signed)
Patient states that he has a new travel unit CPAP that is starting at 5, he says that he is not getting enough start up pressure, wants to have pressure setting change to 11-15. DME: [facility]  Dr. Corrie Dandy, please advise.

## 2015-08-22 NOTE — Telephone Encounter (Signed)
Can you call DME and change settings to 5-15 cm H2O?  Thanks  AD

## 2015-08-24 NOTE — Telephone Encounter (Signed)
Pt returned call - advised order has been placed Pt voiced his understanding Nothing further needed; will sign off

## 2015-08-24 NOTE — Telephone Encounter (Signed)
Order placed to change pressure settings. lmtcb X1 to make pt aware.

## 2015-08-24 NOTE — Telephone Encounter (Signed)
Spoke with pt. He wants his pressure set at 11-15.  AD - is this okay? Thanks.

## 2015-08-24 NOTE — Telephone Encounter (Signed)
Ok to set 11-15 cm H2O.  AD

## 2015-08-25 ENCOUNTER — Encounter: Payer: Self-pay | Admitting: *Deleted

## 2015-09-01 ENCOUNTER — Ambulatory Visit (AMBULATORY_SURGERY_CENTER): Payer: Self-pay

## 2015-09-01 VITALS — Ht 71.0 in | Wt 270.8 lb

## 2015-09-01 DIAGNOSIS — Z1211 Encounter for screening for malignant neoplasm of colon: Secondary | ICD-10-CM

## 2015-09-01 MED ORDER — NA SULFATE-K SULFATE-MG SULF 17.5-3.13-1.6 GM/177ML PO SOLN
ORAL | Status: DC
Start: 2015-09-01 — End: 2015-09-15

## 2015-09-01 NOTE — Progress Notes (Signed)
Per pt, no allergies to soy or egg products.Pt not taking any weight loss meds or using  O2 at home. 

## 2015-09-02 ENCOUNTER — Encounter: Payer: Self-pay | Admitting: Gastroenterology

## 2015-09-15 ENCOUNTER — Ambulatory Visit (AMBULATORY_SURGERY_CENTER): Payer: Managed Care, Other (non HMO) | Admitting: Gastroenterology

## 2015-09-15 ENCOUNTER — Encounter: Payer: Self-pay | Admitting: Gastroenterology

## 2015-09-15 VITALS — BP 137/89 | HR 63 | Temp 99.3°F | Resp 30 | Ht 71.0 in | Wt 270.0 lb

## 2015-09-15 DIAGNOSIS — Z1211 Encounter for screening for malignant neoplasm of colon: Secondary | ICD-10-CM

## 2015-09-15 DIAGNOSIS — D125 Benign neoplasm of sigmoid colon: Secondary | ICD-10-CM

## 2015-09-15 DIAGNOSIS — K635 Polyp of colon: Secondary | ICD-10-CM | POA: Diagnosis not present

## 2015-09-15 DIAGNOSIS — D123 Benign neoplasm of transverse colon: Secondary | ICD-10-CM | POA: Diagnosis not present

## 2015-09-15 MED ORDER — SODIUM CHLORIDE 0.9 % IV SOLN: 500.0000 mL | INTRAVENOUS | Status: DC

## 2015-09-15 NOTE — Progress Notes (Signed)
A/ox3 pleased with MAC, report to Celia RN 

## 2015-09-15 NOTE — Progress Notes (Signed)
Called to room to assist during endoscopic procedure.  Patient ID and intended procedure confirmed with present staff. Received instructions for my participation in the procedure from the performing physician.  

## 2015-09-15 NOTE — Patient Instructions (Signed)
Discharge instructions given. Handout on polyps. Resume previous medications. YOU HAD AN ENDOSCOPIC PROCEDURE TODAY AT THE [facility]:   Refer to the procedure report that was given to you for any specific questions about what was found during the examination.  If the procedure report does not answer your questions, please call your gastroenterologist to clarify.  If you requested that your care partner not be given the details of your procedure findings, then the procedure report has been included in a sealed envelope for you to review at your convenience later.  YOU SHOULD EXPECT: Some feelings of bloating in the abdomen. Passage of more gas than usual.  Walking can help get rid of the air that was put into your GI tract during the procedure and reduce the bloating. If you had a lower endoscopy (such as a colonoscopy or flexible sigmoidoscopy) you may notice spotting of blood in your stool or on the toilet paper. If you underwent a bowel prep for your procedure, you may not have a normal bowel movement for a few days.  Please Note:  You might notice some irritation and congestion in your nose or some drainage.  This is from the oxygen used during your procedure.  There is no need for concern and it should clear up in a day or so.  SYMPTOMS TO REPORT IMMEDIATELY:   Following lower endoscopy (colonoscopy or flexible sigmoidoscopy):  Excessive amounts of blood in the stool  Significant tenderness or worsening of abdominal pains  Swelling of the abdomen that is new, acute  Fever of 100F or higher   For urgent or emergent issues, a gastroenterologist can be reached at any hour by calling [phone].   DIET: Your first meal following the procedure should be a small meal and then it is ok to progress to your normal diet. Heavy or fried foods are harder to digest and may make you feel nauseous or bloated.  Likewise, meals heavy in dairy and vegetables can increase bloating.  Drink  plenty of fluids but you should avoid alcoholic beverages for 24 hours.  ACTIVITY:  You should plan to take it easy for the rest of today and you should NOT DRIVE or use heavy machinery until tomorrow (because of the sedation medicines used during the test).    FOLLOW UP: Our staff will call the number listed on your records the next business day following your procedure to check on you and address any questions or concerns that you may have regarding the information given to you following your procedure. If we do not reach you, we will leave a message.  However, if you are feeling well and you are not experiencing any problems, there is no need to return our call.  We will assume that you have returned to your regular daily activities without incident.  If any biopsies were taken you will be contacted by phone or by letter within the next 1-3 weeks.  Please call us at [phone] if you have not heard about the biopsies in 3 weeks.    SIGNATURES/CONFIDENTIALITY: You and/or your care partner have signed paperwork which will be entered into your electronic medical record.  These signatures attest to the fact that that the information above on your After Visit Summary has been reviewed and is understood.  Full responsibility of the confidentiality of this discharge information lies with you and/or your care-partner. 

## 2015-09-15 NOTE — Op Note (Signed)
[facility] Patient Name: Steven Sloan Procedure Date: 09/15/2015 9:13 AM MRN: [id number] Endoscopist: Milus Banister , MD Age: 55 Referring MD:  Date of Birth: 1960/09/14 Gender: Male Procedure:                Colonoscopy Indications:              Screening for colorectal malignant neoplasm Medicines:                Monitored Anesthesia Care Procedure:                Pre-Anesthesia Assessment:                           - Prior to the procedure, a History and Physical                            was performed, and patient medications and                            allergies were reviewed. The patient's tolerance of                            previous anesthesia was also reviewed. The risks                            and benefits of the procedure and the sedation                            options and risks were discussed with the patient.                            All questions were answered, and informed consent                            was obtained. Prior Anticoagulants: The patient has                            taken no previous anticoagulant or antiplatelet                            agents. ASA Grade Assessment: II - A patient with                            mild systemic disease. After reviewing the risks                            and benefits, the patient was deemed in                            satisfactory condition to undergo the procedure.                           After obtaining informed consent, the colonoscope  was passed under direct vision. Throughout the                            procedure, the patient's blood pressure, pulse, and                            oxygen saturations were monitored continuously. The                            Model CF-HQ190L [id number]) scope was introduced                            through the anus and advanced to the the cecum,                            identified by appendiceal orifice and  ileocecal                            valve. The colonoscopy was performed without                            difficulty. The patient tolerated the procedure                            well. The quality of the bowel preparation was                            excellent. The ileocecal valve, appendiceal                            orifice, and rectum were photographed. Scope In: 9:23:06 AM Scope Out: 9:39:23 AM Scope Withdrawal Time: 0 hours 13 minutes 4 seconds  Total Procedure Duration: 0 hours 16 minutes 17 seconds  Findings:                 Three sessile polyps were found in the sigmoid                            colon and transverse colon. The polyps were 3 to 5                            mm in size. These polyps were removed with a cold                            snare. Resection and retrieval were complete.                           The exam was otherwise without abnormality on                            direct and retroflexion views. Complications:            No immediate complications. Estimated blood loss:  None. Estimated Blood Loss:     Estimated blood loss: none. Impression:               - Three 3 to 5 mm polyps in the sigmoid colon and                            in the transverse colon, removed with a cold snare.                            Resected and retrieved.                           - The examination was otherwise normal on direct                            and retroflexion views. Recommendation:           - Patient has a contact number available for                            emergencies. The signs and symptoms of potential                            delayed complications were discussed with the                            patient. Return to normal activities tomorrow.                            Written discharge instructions were provided to the                            patient.                           - Resume previous diet.                            - Continue present medications.                           You will receive a letter within 2-3 weeks with the                            pathology results and my final recommendations.                           If the polyp(s) is proven to be 'pre-cancerous' on                            pathology, you will need repeat colonoscopy in 3-5                            years. If the polyp(s) is NOT 'precancerous' on  pathology then you should repeat colon cancer                            screening in 10 years with colonoscopy without need                            for colon cancer screening by any method prior to                            then (including stool testing). Milus Banister, MD 09/15/2015 9:41:48 AM This report has been signed electronically.

## 2015-09-16 ENCOUNTER — Telehealth: Payer: Self-pay

## 2015-09-16 NOTE — Telephone Encounter (Signed)
  Follow up Call-  Call back number 09/15/2015  Post procedure Call Back phone  # [phone]  Permission to leave phone message Yes     Patient questions:  Do you have a fever, pain , or abdominal swelling? No. Pain Score  0   Have you tolerated food without any problems? Yes.    Have you been able to return to your normal activities? Yes.    Do you have any questions about your discharge instructions: Diet   No. Medications  No. Follow up visit  No.  Do you have questions or concerns about your Care? No.  Actions: * If pain score is 4 or above: No action needed, pain <4. No problems per the pt. maw

## 2015-09-20 ENCOUNTER — Encounter: Payer: Self-pay | Admitting: Gastroenterology

## 2015-09-30 ENCOUNTER — Ambulatory Visit: Payer: Managed Care, Other (non HMO) | Admitting: Gastroenterology

## 2015-10-26 ENCOUNTER — Encounter: Payer: Self-pay | Admitting: Physician Assistant

## 2015-10-27 MED ORDER — FLUTICASONE PROPIONATE 50 MCG/ACT NA SUSP: 2.0000 | Freq: Every day | NASAL | Status: DC

## 2015-11-11 ENCOUNTER — Encounter: Payer: Self-pay | Admitting: Physician Assistant

## 2015-11-17 ENCOUNTER — Encounter: Payer: Self-pay | Admitting: Physician Assistant

## 2015-11-17 ENCOUNTER — Ambulatory Visit (INDEPENDENT_AMBULATORY_CARE_PROVIDER_SITE_OTHER): Payer: Managed Care, Other (non HMO) | Admitting: Physician Assistant

## 2015-11-17 VITALS — BP 130/88 | HR 61 | Temp 97.9°F | Resp 16 | Ht 71.0 in | Wt 260.1 lb

## 2015-11-17 DIAGNOSIS — F418 Other specified anxiety disorders: Secondary | ICD-10-CM

## 2015-11-17 MED ORDER — DIAZEPAM 5 MG PO TABS: 5.0000 mg | ORAL_TABLET | Freq: Four times a day (QID) | ORAL | Status: DC | PRN

## 2015-11-17 NOTE — Progress Notes (Signed)
Patient presents to clinic today to discuss medication for anxiety with flying. Patient has upcoming trip to [place] where he will have a 10 hour flight. States he gets very anxious with take off and decent, but cannot handle any turbulence without causing him a panic attack. Denies any baseline anxiety or depressed mood.   Past Medical History  Diagnosis Date  . Hypertension   . Allergy     Seasonal  . Sleep apnea     CPAP nightly  . Heart murmur     Asymptomatic -- Patient endorses previous negative evaluation  . Chicken pox     Current Outpatient Prescriptions on File Prior to Visit  Medication Sig Dispense Refill  . amLODipine (NORVASC) 10 MG tablet Take 1 tablet (10 mg total) by mouth daily. 90 tablet 1  . azelastine (ASTELIN) 0.1 % nasal spray Place 1 spray into both nostrils 2 (two) times daily. Use in each nostril as directed 30 mL 12  . fluticasone (FLONASE) 50 MCG/ACT nasal spray Place 2 sprays into both nostrils daily. 48 g 0  . ibuprofen (ADVIL,MOTRIN) 600 MG tablet Take 1 tablet by mouth as needed.  0  . Omega-3 Fatty Acids (FISH OIL) 1200 MG CAPS Take by mouth daily.    . psyllium (REGULOID) 0.52 g capsule Take 0.52 g by mouth daily.    . tadalafil (CIALIS) 10 MG tablet Take 1 tablet (10 mg total) by mouth daily as needed for erectile dysfunction. 6 tablet 11  . terazosin (HYTRIN) 1 MG capsule Take 1 capsule (1 mg total) by mouth at bedtime. 90 capsule 1   No current facility-administered medications on file prior to visit.    No Known Allergies  Family History  Problem Relation Age of Onset  . Heart attack Father 6  . Heart disease Father   . Heart attack Paternal Uncle 26  . Heart attack Paternal Grandfather 69  . Cancer Maternal Grandmother 54    Unsure  . Alcohol abuse Sister     Social History   Social History  . Marital Status: Married    Spouse Name: N/A  . Number of Children: N/A  . Years of Education: N/A   Social History Main Topics  .  Smoking status: Current Some Day Smoker -- 0.25 packs/day for 25 years    Types: Cigarettes  . Smokeless tobacco: Never Used     Comment: is on the Nictoine patch presently, smokes occasionally  . Alcohol Use: 1.2 - 2.4 oz/week    2-4 Standard drinks or equivalent per week     Comment: social  . Drug Use: No  . Sexual Activity: Not Asked   Other Topics Concern  . None   Social History Narrative    Review of Systems - See HPI.  All other ROS are negative.  BP 130/88 mmHg  Pulse 61  Temp(Src) 97.9 F (36.6 C) (Oral)  Resp 16  Ht 5\' 11"  (1.803 m)  Wt 260 lb 2 oz (117.992 kg)  BMI 36.30 kg/m2  SpO2 97%  Physical Exam  Constitutional: He is well-developed, well-nourished, and in no distress.  HENT:  Head: Normocephalic and atraumatic.  Cardiovascular: Normal rate and regular rhythm.   Pulmonary/Chest: Effort normal.  Neurological: He is alert.  Skin: Skin is warm and dry.  Psychiatric: Affect normal.  Vitals reviewed.   Recent Results (from the past 2160 hour(s))  Bilirubin, Direct     Status: None   Collection Time: 08/21/15  8:35 AM  Result Value Ref Range   Bilirubin, Direct 0.2 0.0 - 0.3 mg/dL  Bilirubin, Total     Status: None   Collection Time: 08/21/15  8:35 AM  Result Value Ref Range   Total Bilirubin 1.2 0.2 - 1.2 mg/dL    Assessment/Plan: 1. Situational anxiety With flights. Upcoming 10 hour flight. Discussed options. Will Rx Valium to take as directed.    Leeanne Rio, PA-C

## 2015-11-17 NOTE — Progress Notes (Signed)
Pre visit review using our clinic review tool, if applicable. No additional management support is needed unless otherwise documented below in the visit note/SLS  

## 2015-11-17 NOTE — Patient Instructions (Signed)
I have given you a prescription for Valium to take as directed for your upcoming long flights. Please take 1st about 30 minutes before your flight leaves. Can take another as directed.

## 2016-01-22 ENCOUNTER — Ambulatory Visit (INDEPENDENT_AMBULATORY_CARE_PROVIDER_SITE_OTHER): Payer: Managed Care, Other (non HMO) | Admitting: Physician Assistant

## 2016-01-22 ENCOUNTER — Encounter: Payer: Self-pay | Admitting: Physician Assistant

## 2016-01-22 DIAGNOSIS — I10 Essential (primary) hypertension: Secondary | ICD-10-CM | POA: Diagnosis not present

## 2016-01-22 DIAGNOSIS — Z23 Encounter for immunization: Secondary | ICD-10-CM

## 2016-01-22 MED ORDER — VARENICLINE TARTRATE 0.5 MG X 11 & 1 MG X 42 PO MISC: ORAL | 0 refills | Status: DC

## 2016-01-22 MED ORDER — LISINOPRIL 10 MG PO TABS: 10.0000 mg | ORAL_TABLET | Freq: Every day | ORAL | 3 refills | Status: DC

## 2016-01-22 NOTE — Assessment & Plan Note (Signed)
Previously well controlled with Amlodipine 10 mg daily. Has restarted smoking and has stopped exercise and dietary recommendations. BP above goal today. Asymptomatic. Chantix started for smoking cessation. Will continue Amlodipine and add-on Lisinopril 10 mg daily. Will work on restarting diet and exercise recommendations. FU 1 month.

## 2016-01-22 NOTE — Progress Notes (Signed)
Patient presents to clinic today for follow-up of hypertension. Is currently on amlodipine 10 mg daily. Is taking as directed. Patient denies chest pain, palpitations, lightheadedness, dizziness, vision changes or frequent headaches.  BP Readings from Last 3 Encounters:  01/22/16 (!) 164/98  11/17/15 130/88  09/15/15 137/89   Is currently smoking. Has increased to 1/2 ppd due to recent stressors. Patient without history of COPD or asthma. Has used patches previously which helped somewhat. Would like to discuss  Body mass index is 37.15 kg/m. Diet is well-balanced overall. No routine exercise within the past 3 months due to vacationing, family stressors, etc.  Past Medical History:  Diagnosis Date  . Allergy    Seasonal  . Chicken pox   . Heart murmur    Asymptomatic -- Patient endorses previous negative evaluation  . Hypertension   . Sleep apnea    CPAP nightly    Current Outpatient Prescriptions on File Prior to Visit  Medication Sig Dispense Refill  . amLODipine (NORVASC) 10 MG tablet Take 1 tablet (10 mg total) by mouth daily. 90 tablet 1  . azelastine (ASTELIN) 0.1 % nasal spray Place 1 spray into both nostrils 2 (two) times daily. Use in each nostril as directed 30 mL 12  . diazepam (VALIUM) 5 MG tablet Take 1 tablet (5 mg total) by mouth every 6 (six) hours as needed for anxiety. For long-plane ride use 10 tablet 1  . fluticasone (FLONASE) 50 MCG/ACT nasal spray Place 2 sprays into both nostrils daily. 48 g 0  . ibuprofen (ADVIL,MOTRIN) 600 MG tablet Take 1 tablet by mouth as needed.  0  . Omega-3 Fatty Acids (FISH OIL) 1200 MG CAPS Take 2 capsules by mouth daily.     . psyllium (REGULOID) 0.52 g capsule Take 0.52 g by mouth daily.    . tadalafil (CIALIS) 10 MG tablet Take 1 tablet (10 mg total) by mouth daily as needed for erectile dysfunction. 6 tablet 11  . terazosin (HYTRIN) 1 MG capsule Take 1 capsule (1 mg total) by mouth at bedtime. 90 capsule 1   No current  facility-administered medications on file prior to visit.     No Known Allergies  Family History  Problem Relation Age of Onset  . Heart attack Father 70  . Heart disease Father   . Heart attack Paternal Uncle 11  . Heart attack Paternal Grandfather 22  . Cancer Maternal Grandmother 68    Unsure  . Alcohol abuse Sister     Social History   Social History  . Marital status: Married    Spouse name: N/A  . Number of children: N/A  . Years of education: N/A   Social History Main Topics  . Smoking status: Current Some Day Smoker    Packs/day: 0.25    Years: 25.00    Types: Cigarettes  . Smokeless tobacco: Never Used     Comment: is on the Nictoine patch presently, smokes occasionally  . Alcohol use 1.2 - 2.4 oz/week    2 - 4 Standard drinks or equivalent per week     Comment: social  . Drug use: No  . Sexual activity: Not Asked   Other Topics Concern  . None   Social History Narrative  . None   Review of Systems - See HPI.  All other ROS are negative.  BP (!) 164/98 (BP Location: Right Arm, Cuff Size: Large)   Pulse 69   Temp 98 F (36.7 C) (Oral)  Resp 16   Ht 5\' 11"  (1.803 m)   Wt 266 lb 6 oz (120.8 kg)   SpO2 97%   BMI 37.15 kg/m   Physical Exam  Constitutional: He is oriented to person, place, and time and well-developed, well-nourished, and in no distress.  HENT:  Head: Normocephalic and atraumatic.  Eyes: Conjunctivae are normal.  Cardiovascular: Normal rate, regular rhythm, normal heart sounds and intact distal pulses.   Pulmonary/Chest: Effort normal and breath sounds normal. No respiratory distress. He has no wheezes. He has no rales. He exhibits no tenderness.  Neurological: He is alert and oriented to person, place, and time.  Skin: Skin is warm and dry. No rash noted.  Psychiatric: Affect normal.  Vitals reviewed.  Assessment/Plan: Essential hypertension, benign Previously well controlled with Amlodipine 10 mg daily. Has restarted smoking  and has stopped exercise and dietary recommendations. BP above goal today. Asymptomatic. Chantix started for smoking cessation. Will continue Amlodipine and add-on Lisinopril 10 mg daily. Will work on restarting diet and exercise recommendations. FU 1 month.    Leeanne Rio, PA-C

## 2016-01-22 NOTE — Progress Notes (Signed)
Pre visit review using our clinic review tool, if applicable. No additional management support is needed unless otherwise documented below in the visit note/SLS  

## 2016-01-22 NOTE — Patient Instructions (Signed)
Please continue the amlodipine as directed. I am adding a lisinopril 10 mg daily for now to keep BP under control. Take as directed. Stay well hydrated. Follow the diet below. Start the Chantix to help with smoking cessation. If we stop the smoking, this will hopefully help BP so we can cut back on medication.  Follow-up with me in 1 month for reassessment.

## 2016-01-25 ENCOUNTER — Encounter: Payer: Self-pay | Admitting: Physician Assistant

## 2016-01-25 MED ORDER — LISINOPRIL 10 MG PO TABS: 10.0000 mg | ORAL_TABLET | Freq: Every day | ORAL | 3 refills | Status: DC

## 2016-02-08 ENCOUNTER — Other Ambulatory Visit: Payer: Self-pay | Admitting: Family

## 2016-02-08 ENCOUNTER — Encounter: Payer: Self-pay | Admitting: Physician Assistant

## 2016-02-08 MED ORDER — AMLODIPINE BESYLATE 10 MG PO TABS: 10.0000 mg | ORAL_TABLET | Freq: Every day | ORAL | 0 refills | Status: DC

## 2016-02-08 MED ORDER — AMLODIPINE BESYLATE 10 MG PO TABS: 10.0000 mg | ORAL_TABLET | Freq: Every day | ORAL | 1 refills | Status: DC

## 2016-02-08 MED ORDER — TERAZOSIN HCL 1 MG PO CAPS: 1.0000 mg | ORAL_CAPSULE | Freq: Every day | ORAL | 1 refills | Status: DC

## 2016-02-08 MED ORDER — FLUTICASONE PROPIONATE 50 MCG/ACT NA SUSP: 2.0000 | Freq: Every day | NASAL | 0 refills | Status: DC

## 2016-02-28 NOTE — Progress Notes (Signed)
Patient presents to clinic today for follow-up of hypertension. Patient currently on a regimen of amlodipine 10 mg daily, lisinopril 10 mg daily and hytrin 1 mg daily. Patient is taking medications as directed. Denies side effect of medication but has noted some mild fatigue during the midday since starting lisinopril. Patient denies chest pain, palpitations, lightheadedness, dizziness, vision changes or frequent headaches.  BP Readings from Last 3 Encounters:  02/29/16 132/78  01/22/16 (!) 164/98  11/17/15 130/88   Patient also noted side effects to Chantix (nausea). Has stopped and restarted Nicoderm CQ Step I patch. Is doing well on this. Has cut back to only 5 per day.   Patient requesting routine HIV and Hep C screening. Denies concerns today.  Past Medical History:  Diagnosis Date  . Allergy    Seasonal  . Chicken pox   . Heart murmur    Asymptomatic -- Patient endorses previous negative evaluation  . Hypertension   . Sleep apnea    CPAP nightly    Current Outpatient Prescriptions on File Prior to Visit  Medication Sig Dispense Refill  . amLODipine (NORVASC) 10 MG tablet Take 1 tablet (10 mg total) by mouth daily. 90 tablet 1  . azelastine (ASTELIN) 0.1 % nasal spray Place 1 spray into both nostrils 2 (two) times daily. Use in each nostril as directed 30 mL 12  . diazepam (VALIUM) 5 MG tablet Take 1 tablet (5 mg total) by mouth every 6 (six) hours as needed for anxiety. For long-plane ride use 10 tablet 1  . fluticasone (FLONASE) 50 MCG/ACT nasal spray Place 2 sprays into both nostrils daily. 48 g 0  . ibuprofen (ADVIL,MOTRIN) 600 MG tablet Take 1 tablet by mouth as needed.  0  . lisinopril (PRINIVIL,ZESTRIL) 10 MG tablet Take 1 tablet (10 mg total) by mouth daily. 30 tablet 3  . Nutritional Supplements (JUICE PLUS FIBRE PO) Take 6 each by mouth daily.    . Omega-3 Fatty Acids (FISH OIL) 1200 MG CAPS Take 2 capsules by mouth daily.     . psyllium (REGULOID) 0.52 g capsule  Take 0.52 g by mouth daily.    . tadalafil (CIALIS) 10 MG tablet Take 1 tablet (10 mg total) by mouth daily as needed for erectile dysfunction. 6 tablet 11  . terazosin (HYTRIN) 1 MG capsule Take 1 capsule (1 mg total) by mouth at bedtime. 90 capsule 1   No current facility-administered medications on file prior to visit.     No Known Allergies  Family History  Problem Relation Age of Onset  . Heart attack Father 21  . Heart disease Father   . Heart attack Paternal Uncle 35  . Heart attack Paternal Grandfather 36  . Cancer Maternal Grandmother 70    Unsure  . Alcohol abuse Sister     Social History   Social History  . Marital status: Married    Spouse name: N/A  . Number of children: N/A  . Years of education: N/A   Social History Main Topics  . Smoking status: Current Some Day Smoker    Packs/day: 0.25    Years: 25.00    Types: Cigarettes  . Smokeless tobacco: Never Used     Comment: is on the Nictoine patch presently, smokes occasionally  . Alcohol use 1.2 - 2.4 oz/week    2 - 4 Standard drinks or equivalent per week     Comment: social  . Drug use: No  . Sexual activity: Not Asked  Other Topics Concern  . None   Social History Narrative  . None   Review of Systems - See HPI.  All other ROS are negative.  BP 132/78 (BP Location: Left Arm, Patient Position: Sitting, Cuff Size: Large)   Pulse 68   Temp 97.6 F (36.4 C) (Oral)   Resp 18   Ht 5\' 11"  (1.803 m)   Wt 267 lb 2 oz (121.2 kg)   SpO2 98%   BMI 37.26 kg/m   Physical Exam  Constitutional: He is oriented to person, place, and time and well-developed, well-nourished, and in no distress.  HENT:  Head: Normocephalic and atraumatic.  Eyes: Conjunctivae are normal.  Cardiovascular: Normal rate, regular rhythm, normal heart sounds and intact distal pulses.   Pulmonary/Chest: Effort normal and breath sounds normal. No respiratory distress. He has no wheezes. He has no rales. He exhibits no tenderness.    Neurological: He is alert and oriented to person, place, and time.  Skin: Skin is warm and dry.  Vitals reviewed.    Assessment/Plan: No problem-specific Assessment & Plan notes found for this encounter.    Leeanne Rio, PA-C

## 2016-02-29 ENCOUNTER — Ambulatory Visit (INDEPENDENT_AMBULATORY_CARE_PROVIDER_SITE_OTHER): Payer: Managed Care, Other (non HMO) | Admitting: Physician Assistant

## 2016-02-29 ENCOUNTER — Encounter: Payer: Self-pay | Admitting: Physician Assistant

## 2016-02-29 VITALS — BP 132/78 | HR 68 | Temp 97.6°F | Resp 18 | Ht 71.0 in | Wt 267.1 lb

## 2016-02-29 DIAGNOSIS — I10 Essential (primary) hypertension: Secondary | ICD-10-CM

## 2016-02-29 DIAGNOSIS — Z1159 Encounter for screening for other viral diseases: Secondary | ICD-10-CM | POA: Diagnosis not present

## 2016-02-29 DIAGNOSIS — Z114 Encounter for screening for human immunodeficiency virus [HIV]: Secondary | ICD-10-CM | POA: Diagnosis not present

## 2016-02-29 LAB — BASIC METABOLIC PANEL
BUN: 23 mg/dL (ref 6–23)
CHLORIDE: 106 meq/L (ref 96–112)
CO2: 26 mEq/L (ref 19–32)
Calcium: 10.2 mg/dL (ref 8.4–10.5)
Creatinine, Ser: 1.41 mg/dL (ref 0.40–1.50)
GFR: 55.89 mL/min — ABNORMAL LOW (ref >60.00)
Glucose, Bld: 113 mg/dL — ABNORMAL HIGH (ref 70–99)
POTASSIUM: 4.1 meq/L (ref 3.5–5.1)
SODIUM: 140 meq/L (ref 135–145)

## 2016-02-29 LAB — HEPATITIS C ANTIBODY: HCV AB: NEGATIVE

## 2016-02-29 NOTE — Patient Instructions (Addendum)
Please go to the lab for blood work. We will call you with your results.  Please continue medications as directed but try taking Lisinopril in the evening. See if this helps with fatigue. Follow-up with [place] in 3 months. Return sooner if needed.  DASH Eating Plan DASH stands for "Dietary Approaches to Stop Hypertension." The DASH eating plan is a healthy eating plan that has been shown to reduce high blood pressure (hypertension). Additional health benefits may include reducing the risk of type 2 diabetes mellitus, heart disease, and stroke. The DASH eating plan may also help with weight loss. WHAT DO I NEED TO KNOW ABOUT THE DASH EATING PLAN? For the DASH eating plan, you will follow these general guidelines:  Choose foods with a percent daily value for sodium of less than 5% (as listed on the food label).  Use salt-free seasonings or herbs instead of table salt or sea salt.  Check with your health care provider or pharmacist before using salt substitutes.  Eat lower-sodium products, often labeled as "lower sodium" or "no salt added."  Eat fresh foods.  Eat more vegetables, fruits, and low-fat dairy products.  Choose whole grains. Look for the word "whole" as the first word in the ingredient list.  Choose fish and skinless chicken or [place] more often than red meat. Limit fish, poultry, and meat to 6 oz (170 g) each day.  Limit sweets, desserts, sugars, and sugary drinks.  Choose heart-healthy fats.  Limit cheese to 1 oz (28 g) per day.  Eat more home-cooked food and less restaurant, buffet, and fast food.  Limit fried foods.  Cook foods using methods other than frying.  Limit canned vegetables. If you do use them, rinse them well to decrease the sodium.  When eating at a restaurant, ask that your food be prepared with less salt, or no salt if possible. WHAT FOODS CAN I EAT? Seek help from a dietitian for individual calorie needs. Grains Whole grain or whole wheat bread.  Brown rice. Whole grain or whole wheat pasta. Quinoa, bulgur, and whole grain cereals. Low-sodium cereals. Corn or whole wheat flour tortillas. Whole grain cornbread. Whole grain crackers. Low-sodium crackers. Vegetables Fresh or frozen vegetables (raw, steamed, roasted, or grilled). Low-sodium or reduced-sodium tomato and vegetable juices. Low-sodium or reduced-sodium tomato sauce and paste. Low-sodium or reduced-sodium canned vegetables.  Fruits All fresh, canned (in natural juice), or frozen fruits. Meat and Other Protein Products Ground beef (85% or leaner), grass-fed beef, or beef trimmed of fat. Skinless chicken or [place]. Ground chicken or [place]. Pork trimmed of fat. All fish and seafood. Eggs. Dried beans, peas, or lentils. Unsalted nuts and seeds. Unsalted canned beans. Dairy Low-fat dairy products, such as skim or 1% milk, 2% or reduced-fat cheeses, low-fat ricotta or cottage cheese, or plain low-fat yogurt. Low-sodium or reduced-sodium cheeses. Fats and Oils Tub margarines without trans fats. Light or reduced-fat mayonnaise and salad dressings (reduced sodium). Avocado. Safflower, olive, or canola oils. Natural peanut or almond butter. Other Unsalted popcorn and pretzels. The items listed above may not be a complete list of recommended foods or beverages. Contact your dietitian for more options. WHAT FOODS ARE NOT RECOMMENDED? Grains White bread. White pasta. White rice. Refined cornbread. Bagels and croissants. Crackers that contain trans fat. Vegetables Creamed or fried vegetables. Vegetables in a cheese sauce. Regular canned vegetables. Regular canned tomato sauce and paste. Regular tomato and vegetable juices. Fruits Dried fruits. Canned fruit in light or heavy syrup. Fruit juice. Meat and Other  Protein Products Fatty cuts of meat. Ribs, chicken wings, bacon, sausage, bologna, salami, chitterlings, fatback, hot dogs, bratwurst, and packaged luncheon meats. Salted nuts and seeds.  Canned beans with salt. Dairy Whole or 2% milk, cream, half-and-half, and cream cheese. Whole-fat or sweetened yogurt. Full-fat cheeses or blue cheese. Nondairy creamers and whipped toppings. Processed cheese, cheese spreads, or cheese curds. Condiments Onion and garlic salt, seasoned salt, table salt, and sea salt. Canned and packaged gravies. Worcestershire sauce. Tartar sauce. Barbecue sauce. Teriyaki sauce. Soy sauce, including reduced sodium. Steak sauce. Fish sauce. Oyster sauce. Cocktail sauce. Horseradish. Ketchup and mustard. Meat flavorings and tenderizers. Bouillon cubes. Hot sauce. Tabasco sauce. Marinades. Taco seasonings. Relishes. Fats and Oils Butter, stick margarine, lard, shortening, ghee, and bacon fat. Coconut, palm kernel, or palm oils. Regular salad dressings. Other Pickles and olives. Salted popcorn and pretzels. The items listed above may not be a complete list of foods and beverages to avoid. Contact your dietitian for more information. WHERE CAN I FIND MORE INFORMATION? National Heart, Lung, and Blood Institute: travelstabloid.com   This information is not intended to replace advice given to you by your health care provider. Make sure you discuss any questions you have with your health care provider.   Document Released: 04/07/2011 Document Revised: 05/09/2014 Document Reviewed: 02/20/2013 Elsevier Interactive Patient Education [employer].

## 2016-03-01 LAB — HIV ANTIBODY (ROUTINE TESTING W REFLEX): HIV 1&2 Ab, 4th Generation: NONREACTIVE

## 2016-03-04 ENCOUNTER — Other Ambulatory Visit: Payer: Self-pay | Admitting: Physician Assistant

## 2016-05-16 ENCOUNTER — Encounter: Payer: Self-pay | Admitting: Physician Assistant

## 2016-05-17 NOTE — Telephone Encounter (Signed)
Please see first part of mychart message. Patient has attached new insurance card which we want to get correctly scanned into chart.

## 2016-05-31 ENCOUNTER — Ambulatory Visit (INDEPENDENT_AMBULATORY_CARE_PROVIDER_SITE_OTHER): Payer: BLUE CROSS/BLUE SHIELD | Admitting: Physician Assistant

## 2016-05-31 ENCOUNTER — Other Ambulatory Visit: Payer: Self-pay | Admitting: Emergency Medicine

## 2016-05-31 ENCOUNTER — Encounter: Payer: Self-pay | Admitting: Physician Assistant

## 2016-05-31 VITALS — BP 130/80 | HR 66 | Temp 98.4°F | Resp 16 | Ht 71.0 in | Wt 269.0 lb

## 2016-05-31 DIAGNOSIS — Z23 Encounter for immunization: Secondary | ICD-10-CM | POA: Diagnosis not present

## 2016-05-31 DIAGNOSIS — I10 Essential (primary) hypertension: Secondary | ICD-10-CM

## 2016-05-31 DIAGNOSIS — Z6837 Body mass index (BMI) 37.0-37.9, adult: Secondary | ICD-10-CM | POA: Diagnosis not present

## 2016-05-31 DIAGNOSIS — IMO0001 Reserved for inherently not codable concepts without codable children: Secondary | ICD-10-CM

## 2016-05-31 DIAGNOSIS — E6609 Other obesity due to excess calories: Secondary | ICD-10-CM | POA: Diagnosis not present

## 2016-05-31 LAB — BASIC METABOLIC PANEL
BUN: 21 mg/dL (ref 6–23)
CALCIUM: 9.9 mg/dL (ref 8.4–10.5)
CO2: 29 meq/L (ref 19–32)
CREATININE: 1.37 mg/dL (ref 0.40–1.50)
Chloride: 104 mEq/L (ref 96–112)
GFR: 57.72 mL/min — AB (ref >60.00)
Glucose, Bld: 108 mg/dL — ABNORMAL HIGH (ref 70–99)
Potassium: 4.1 mEq/L (ref 3.5–5.1)
Sodium: 137 mEq/L (ref 135–145)

## 2016-05-31 MED ORDER — AMLODIPINE BESYLATE 10 MG PO TABS: 10.0000 mg | ORAL_TABLET | Freq: Every day | ORAL | 1 refills | Status: DC

## 2016-05-31 NOTE — Assessment & Plan Note (Signed)
BP controlled. Asymptomatic. BMP today. Continue current medication regimen.

## 2016-05-31 NOTE — Progress Notes (Signed)
Pre visit review using our clinic review tool, if applicable. No additional management support is needed unless otherwise documented below in the visit note. 

## 2016-05-31 NOTE — Addendum Note (Signed)
Addended by: Davis Gourd on: 05/31/2016 12:59 PM   Modules accepted: Orders

## 2016-05-31 NOTE — Progress Notes (Signed)
Patient presents to clinic today for follow-up of chronic medical conditions.   Hypertension -- Is currently on a regimen of Lisinopril 10 mg QD, Amlodipine 10 mg daily and Hytrin 1 mg QHS. Is taking as directed without side effect. Treadmill 30 minutes per day, 4-5 days per week. Is also doing resistance training on the weekends. Just started this regimen over the past few weeks.  Body mass index is 37.52 kg/m. Has cut out all fast food. Cooking at home more, trying to limit salt. Patient denies chest pain, palpitations, lightheadedness, dizziness, vision changes or frequent headaches.   BP Readings from Last 3 Encounters:  05/31/16 130/80  02/29/16 132/78  01/22/16 (!) 164/98   Past Medical History:  Diagnosis Date  . Allergy    Seasonal  . Chicken pox   . Heart murmur    Asymptomatic -- Patient endorses previous negative evaluation  . Hypertension   . Sleep apnea    CPAP nightly    Current Outpatient Prescriptions on File Prior to Visit  Medication Sig Dispense Refill  . amLODipine (NORVASC) 10 MG tablet Take 1 tablet (10 mg total) by mouth daily. 90 tablet 1  . azelastine (ASTELIN) 0.1 % nasal spray Place 1 spray into both nostrils 2 (two) times daily. Use in each nostril as directed 30 mL 12  . fluticasone (FLONASE) 50 MCG/ACT nasal spray Place 2 sprays into both nostrils daily. 48 g 0  . ibuprofen (ADVIL,MOTRIN) 600 MG tablet Take 1 tablet by mouth as needed.  0  . lisinopril (PRINIVIL,ZESTRIL) 10 MG tablet Take 1 tablet (10 mg total) by mouth daily. 30 tablet 3  . Nutritional Supplements (JUICE PLUS FIBRE PO) Take 6 each by mouth daily.    . Omega-3 Fatty Acids (FISH OIL) 1200 MG CAPS Take 2 capsules by mouth daily.     . psyllium (REGULOID) 0.52 g capsule Take 0.52 g by mouth daily.    . tadalafil (CIALIS) 10 MG tablet Take 1 tablet (10 mg total) by mouth daily as needed for erectile dysfunction. 6 tablet 11  . terazosin (HYTRIN) 1 MG capsule Take 1 capsule (1 mg total)  by mouth at bedtime. 90 capsule 1   No current facility-administered medications on file prior to visit.     No Known Allergies  Family History  Problem Relation Age of Onset  . Heart attack Father 22  . Heart disease Father   . Alcohol abuse Sister   . Heart attack Paternal Uncle 73  . Heart attack Paternal Grandfather 25  . Cancer Maternal Grandmother 83    Unsure    Social History   Social History  . Marital status: Married    Spouse name: N/A  . Number of children: N/A  . Years of education: N/A   Social History Main Topics  . Smoking status: Former Smoker    Packs/day: 0.25    Years: 25.00    Types: Cigarettes    Quit date: 05/17/2016  . Smokeless tobacco: Never Used  . Alcohol use 1.2 - 2.4 oz/week    2 - 4 Standard drinks or equivalent per week     Comment: social  . Drug use: No  . Sexual activity: Not Asked   Other Topics Concern  . None   Social History Narrative  . None   Review of Systems - See HPI.  All other ROS are negative.  BP 130/80   Pulse 66   Temp 98.4 F (36.9 C) (Oral)  Resp 16   Ht 5\' 11"  (1.803 m)   Wt 269 lb (122 kg)   SpO2 98%   BMI 37.52 kg/m   Physical Exam  Constitutional: He is oriented to person, place, and time and well-developed, well-nourished, and in no distress.  HENT:  Head: Normocephalic and atraumatic.  Eyes: Conjunctivae are normal.  Neck: Neck supple.  Cardiovascular: Normal rate, regular rhythm, normal heart sounds and intact distal pulses.   Pulmonary/Chest: Effort normal and breath sounds normal. No respiratory distress. He has no wheezes. He has no rales. He exhibits no tenderness.  Neurological: He is alert and oriented to person, place, and time.  Skin: Skin is warm and dry. No rash noted.  Psychiatric: Affect normal.  Vitals reviewed.  Assessment/Plan: Obesity Working hard on dietary changes. Is exercising 4-5 days per week which is to be commended. Will continue to monitor. Will reassess weight  at CPE in March.  Essential hypertension, benign BP controlled. Asymptomatic. BMP today. Continue current medication regimen.     Leeanne Rio, PA-C

## 2016-05-31 NOTE — Assessment & Plan Note (Signed)
Working hard on dietary changes. Is exercising 4-5 days per week which is to be commended. Will continue to monitor. Will reassess weight at CPE in March.

## 2016-05-31 NOTE — Patient Instructions (Signed)
Please keep up the good work with diet and exercise. Continue chronic medications as directed.  Please go to the lab for blood work. I will call you with your results.  I will see you at your physical in March.

## 2016-06-01 ENCOUNTER — Encounter: Payer: Self-pay | Admitting: Emergency Medicine

## 2016-06-06 ENCOUNTER — Encounter: Payer: Self-pay | Admitting: Physician Assistant

## 2016-06-06 MED ORDER — AMLODIPINE BESYLATE 10 MG PO TABS: 10.0000 mg | ORAL_TABLET | Freq: Every day | ORAL | 0 refills | Status: DC

## 2016-06-06 MED ORDER — TERAZOSIN HCL 1 MG PO CAPS: 1.0000 mg | ORAL_CAPSULE | Freq: Every day | ORAL | 0 refills | Status: DC

## 2016-06-23 ENCOUNTER — Encounter: Payer: Self-pay | Admitting: Physician Assistant

## 2016-06-23 MED ORDER — TERAZOSIN HCL 1 MG PO CAPS: 1.0000 mg | ORAL_CAPSULE | Freq: Every day | ORAL | 1 refills | Status: DC

## 2016-06-23 MED ORDER — FLUTICASONE PROPIONATE 50 MCG/ACT NA SUSP: 2.0000 | Freq: Every day | NASAL | 3 refills | Status: DC

## 2016-06-23 MED ORDER — AMLODIPINE BESYLATE 10 MG PO TABS: 10.0000 mg | ORAL_TABLET | Freq: Every day | ORAL | 1 refills | Status: DC

## 2016-06-23 MED ORDER — LISINOPRIL 10 MG PO TABS: 10.0000 mg | ORAL_TABLET | Freq: Every day | ORAL | 1 refills | Status: DC

## 2016-06-23 MED ORDER — AZELASTINE HCL 0.1 % NA SOLN: 1.0000 | Freq: Two times a day (BID) | NASAL | 3 refills | Status: DC

## 2016-06-23 MED ORDER — TADALAFIL 10 MG PO TABS: 10.0000 mg | ORAL_TABLET | Freq: Every day | ORAL | 6 refills | Status: DC | PRN

## 2016-06-29 ENCOUNTER — Telehealth: Payer: Self-pay | Admitting: *Deleted

## 2016-06-29 NOTE — Telephone Encounter (Signed)
PA for Cialis approved through 05/01/2038  Patient is aware and he is making sure that this was sent to the mail delivery through the pharmacy.

## 2016-07-01 ENCOUNTER — Ambulatory Visit: Payer: Managed Care, Other (non HMO) | Admitting: Pulmonary Disease

## 2016-07-22 ENCOUNTER — Ambulatory Visit (INDEPENDENT_AMBULATORY_CARE_PROVIDER_SITE_OTHER): Payer: BLUE CROSS/BLUE SHIELD | Admitting: Physician Assistant

## 2016-07-22 ENCOUNTER — Encounter: Payer: Self-pay | Admitting: Physician Assistant

## 2016-07-22 ENCOUNTER — Encounter: Payer: Managed Care, Other (non HMO) | Admitting: Physician Assistant

## 2016-07-22 VITALS — BP 120/78 | HR 55 | Temp 98.3°F | Resp 16 | Ht 71.0 in | Wt 261.0 lb

## 2016-07-22 DIAGNOSIS — G4733 Obstructive sleep apnea (adult) (pediatric): Secondary | ICD-10-CM | POA: Diagnosis not present

## 2016-07-22 DIAGNOSIS — I10 Essential (primary) hypertension: Secondary | ICD-10-CM | POA: Diagnosis not present

## 2016-07-22 DIAGNOSIS — M6289 Other specified disorders of muscle: Secondary | ICD-10-CM

## 2016-07-22 DIAGNOSIS — I998 Other disorder of circulatory system: Secondary | ICD-10-CM | POA: Diagnosis not present

## 2016-07-22 DIAGNOSIS — Z0000 Encounter for general adult medical examination without abnormal findings: Secondary | ICD-10-CM | POA: Diagnosis not present

## 2016-07-22 LAB — CBC
HEMATOCRIT: 43.7 % (ref 39.0–52.0)
HEMOGLOBIN: 14.9 g/dL (ref 13.0–17.0)
MCHC: 34.1 g/dL (ref 30.0–36.0)
MCV: 93.4 fl (ref 78.0–100.0)
Platelets: 229 10*3/uL (ref 150.0–400.0)
RBC: 4.68 Mil/uL (ref 4.22–5.81)
RDW: 12.8 % (ref 11.5–15.5)
WBC: 6.7 10*3/uL (ref 4.0–10.5)

## 2016-07-22 LAB — URINALYSIS, ROUTINE W REFLEX MICROSCOPIC
BILIRUBIN URINE: NEGATIVE
Hgb urine dipstick: NEGATIVE
KETONES UR: NEGATIVE
LEUKOCYTES UA: NEGATIVE
Nitrite: NEGATIVE
SPECIFIC GRAVITY, URINE: 1.02 (ref 1.000–1.030)
Total Protein, Urine: NEGATIVE
UROBILINOGEN UA: 0.2 (ref 0.0–1.0)
Urine Glucose: NEGATIVE
WBC UA: NONE SEEN (ref 0–?)
pH: 5.5 (ref 5.0–8.0)

## 2016-07-22 LAB — COMPREHENSIVE METABOLIC PANEL
ALK PHOS: 42 U/L (ref 39–117)
ALT: 25 U/L (ref 0–53)
AST: 16 U/L (ref 0–37)
Albumin: 4.7 g/dL (ref 3.5–5.2)
BUN: 19 mg/dL (ref 6–23)
CALCIUM: 9.7 mg/dL (ref 8.4–10.5)
CHLORIDE: 104 meq/L (ref 96–112)
CO2: 30 mEq/L (ref 19–32)
Creatinine, Ser: 1.32 mg/dL (ref 0.40–1.50)
GFR: 60.22 mL/min (ref >60.00)
Glucose, Bld: 109 mg/dL — ABNORMAL HIGH (ref 70–99)
POTASSIUM: 4.6 meq/L (ref 3.5–5.1)
Sodium: 139 mEq/L (ref 135–145)
TOTAL PROTEIN: 7.4 g/dL (ref 6.0–8.3)
Total Bilirubin: 1.3 mg/dL — ABNORMAL HIGH (ref 0.2–1.2)

## 2016-07-22 LAB — LIPID PANEL
CHOLESTEROL: 173 mg/dL (ref 0–200)
HDL: 40.4 mg/dL (ref >39.00)
LDL Cholesterol: 105 mg/dL — ABNORMAL HIGH (ref 0–99)
NonHDL: 132.95
TRIGLYCERIDES: 139 mg/dL (ref 0.0–149.0)
Total CHOL/HDL Ratio: 4
VLDL: 27.8 mg/dL (ref 0.0–40.0)

## 2016-07-22 LAB — HEMOGLOBIN A1C: Hgb A1c MFr Bld: 4.7 % (ref 4.6–6.5)

## 2016-07-22 LAB — TSH: TSH: 1.17 u[IU]/mL (ref 0.35–4.50)

## 2016-07-22 LAB — PSA: PSA: 2.95 ng/mL (ref 0.10–4.00)

## 2016-07-22 NOTE — Progress Notes (Signed)
Patient presents to clinic today for annual exam.  Patient is fasting for labs.  Acute Concerns: Patient endorses prior diagnosis of chronic prostatitis. Has noted intermittent perineal discomfort described as pulling or tension for many years. Denies dysuria, urinary urgency or frequency. Denies hematuria, urinary hesitancy or dribbling. Sometimes notes the discomfort with ejaculation. States he was told by a prior specialist that it was related to a chronic prostatitis but was given no further testing or management options. Would like referral back to Urology for further assessment. States that pain is not severe but bothersome and annoying.   Chronic Issues: Hypertension -- Currently on amlodipine and lisinopril. Is taking as directed. Patient denies chest pain, palpitations, lightheadedness, dizziness, vision changes or frequent headaches. Patient has been keeping a check on BP levels at home. Has noted sometimes 20-30 point difference between BP in R and L arms. Denies any known history of PAD.   BP Readings from Last 3 Encounters:  07/22/16 120/78  05/31/16 130/80  02/29/16 132/78   Sleep Apnea -- On CPAP. Is wearing nightly as directed.   Health Maintenance: Immunizations -- up-to-date Colonoscopy -- 09/05/2015. Polyps. Repeat 5 years.   Past Medical History:  Diagnosis Date  . Allergy    Seasonal  . Chicken pox   . Heart murmur    Asymptomatic -- Patient endorses previous negative evaluation  . Hypertension   . Sleep apnea    CPAP nightly    Past Surgical History:  Procedure Laterality Date  . COLONOSCOPY    . NO PAST SURGERIES    . WISDOM TOOTH EXTRACTION      Current Outpatient Prescriptions on File Prior to Visit  Medication Sig Dispense Refill  . amLODipine (NORVASC) 10 MG tablet Take 1 tablet (10 mg total) by mouth daily. 90 tablet 1  . aspirin EC 81 MG tablet Take 81 mg by mouth daily.    Marland Kitchen azelastine (ASTELIN) 0.1 % nasal spray Place 1 spray into both  nostrils 2 (two) times daily. Use in each nostril as directed 30 mL 3  . fluticasone (FLONASE) 50 MCG/ACT nasal spray Place 2 sprays into both nostrils daily. 16 g 3  . ibuprofen (ADVIL,MOTRIN) 600 MG tablet Take 1 tablet by mouth as needed.  0  . lisinopril (PRINIVIL,ZESTRIL) 10 MG tablet Take 1 tablet (10 mg total) by mouth daily. 90 tablet 1  . Nutritional Supplements (JUICE PLUS FIBRE PO) Take 6 each by mouth daily.    . Omega-3 Fatty Acids (FISH OIL) 1200 MG CAPS Take 2 capsules by mouth daily.     . tadalafil (CIALIS) 10 MG tablet Take 1 tablet (10 mg total) by mouth daily as needed for erectile dysfunction. 6 tablet 6  . terazosin (HYTRIN) 1 MG capsule Take 1 capsule (1 mg total) by mouth at bedtime. 90 capsule 1   No current facility-administered medications on file prior to visit.     No Known Allergies  Family History  Problem Relation Age of Onset  . Heart attack Father 41  . Heart disease Father   . Alcohol abuse Sister   . Heart attack Paternal Uncle 3  . Heart attack Paternal Grandfather 68  . Cancer Maternal Grandmother 69    Unsure    Social History   Social History  . Marital status: Married    Spouse name: N/A  . Number of children: N/A  . Years of education: N/A   Occupational History  . Not on file.   Social  History Main Topics  . Smoking status: Former Smoker    Packs/day: 0.25    Years: 25.00    Types: Cigarettes    Quit date: 05/17/2016  . Smokeless tobacco: Never Used  . Alcohol use 1.2 - 2.4 oz/week    2 - 4 Standard drinks or equivalent per week     Comment: social  . Drug use: No  . Sexual activity: Yes   Other Topics Concern  . Not on file   Social History Narrative  . No narrative on file   Review of Systems  Constitutional: Negative for fever and weight loss.  HENT: Negative for ear discharge, ear pain, hearing loss and tinnitus.   Eyes: Negative for blurred vision, double vision, photophobia and pain.  Respiratory: Negative for  cough and shortness of breath.   Cardiovascular: Negative for chest pain and palpitations.  Gastrointestinal: Negative for abdominal pain, blood in stool, constipation, diarrhea, heartburn, melena, nausea and vomiting.  Genitourinary: Negative for dysuria, flank pain, frequency, hematuria and urgency.  Musculoskeletal: Negative for falls.  Neurological: Negative for dizziness, loss of consciousness and headaches.  Endo/Heme/Allergies: Negative for environmental allergies.  Psychiatric/Behavioral: Negative for depression, hallucinations, substance abuse and suicidal ideas. The patient is not nervous/anxious and does not have insomnia.    BP 120/78   Pulse (!) 55   Temp 98.3 F (36.8 C) (Oral)   Resp 16   Ht 5\' 11"  (1.803 m)   Wt 261 lb (118.4 kg)   SpO2 98%   BMI 36.40 kg/m   Physical Exam  Constitutional: He is oriented to person, place, and time and well-developed, well-nourished, and in no distress.  HENT:  Head: Normocephalic and atraumatic.  Right Ear: External ear normal.  Left Ear: External ear normal.  Nose: Nose normal.  Mouth/Throat: Oropharynx is clear and moist. No oropharyngeal exudate.  Eyes: Conjunctivae and EOM are normal. Pupils are equal, round, and reactive to light.  Neck: Neck supple. No thyromegaly present.  Cardiovascular: Normal rate, regular rhythm, normal heart sounds and intact distal pulses.   Pulmonary/Chest: Effort normal and breath sounds normal. No respiratory distress. He has no wheezes. He has no rales. He exhibits no tenderness.  Abdominal: Soft. Bowel sounds are normal. He exhibits no distension and no mass. There is no tenderness. There is no rebound and no guarding.  Genitourinary: Testes/scrotum normal and penis normal. No discharge found.  Lymphadenopathy:    He has no cervical adenopathy.  Neurological: He is alert and oriented to person, place, and time.  Skin: Skin is warm and dry. No rash noted.  Psychiatric: Affect normal.  Vitals  reviewed.  Recent Results (from the past 2160 hour(s))  Basic metabolic panel     Status: Abnormal   Collection Time: 05/31/16 12:08 PM  Result Value Ref Range   Sodium 137 135 - 145 mEq/L   Potassium 4.1 3.5 - 5.1 mEq/L   Chloride 104 96 - 112 mEq/L   CO2 29 19 - 32 mEq/L   Glucose, Bld 108 (H) 70 - 99 mg/dL   BUN 21 6 - 23 mg/dL   Creatinine, Ser 1.37 0.40 - 1.50 mg/dL   Calcium 9.9 8.4 - 10.5 mg/dL   GFR 57.72 (L) >60.00 mL/min  CBC     Status: None   Collection Time: 07/22/16 10:30 AM  Result Value Ref Range   WBC 6.7 4.0 - 10.5 K/uL   RBC 4.68 4.22 - 5.81 Mil/uL   Platelets 229.0 150.0 - 400.0 K/uL  Hemoglobin 14.9 13.0 - 17.0 g/dL   HCT 43.7 39.0 - 52.0 %   MCV 93.4 78.0 - 100.0 fl   MCHC 34.1 30.0 - 36.0 g/dL   RDW 12.8 11.5 - 15.5 %  Comprehensive metabolic panel     Status: Abnormal   Collection Time: 07/22/16 10:30 AM  Result Value Ref Range   Sodium 139 135 - 145 mEq/L   Potassium 4.6 3.5 - 5.1 mEq/L   Chloride 104 96 - 112 mEq/L   CO2 30 19 - 32 mEq/L   Glucose, Bld 109 (H) 70 - 99 mg/dL   BUN 19 6 - 23 mg/dL   Creatinine, Ser 1.32 0.40 - 1.50 mg/dL   Total Bilirubin 1.3 (H) 0.2 - 1.2 mg/dL   Alkaline Phosphatase 42 39 - 117 U/L   AST 16 0 - 37 U/L   ALT 25 0 - 53 U/L   Total Protein 7.4 6.0 - 8.3 g/dL   Albumin 4.7 3.5 - 5.2 g/dL   Calcium 9.7 8.4 - 10.5 mg/dL   GFR 60.22 >60.00 mL/min  Lipid panel     Status: Abnormal   Collection Time: 07/22/16 10:30 AM  Result Value Ref Range   Cholesterol 173 0 - 200 mg/dL    Comment: ATP III Classification       Desirable:  < 200 mg/dL               Borderline High:  200 - 239 mg/dL          High:  > = 240 mg/dL   Triglycerides 139.0 0.0 - 149.0 mg/dL    Comment: Normal:  <150 mg/dLBorderline High:  150 - 199 mg/dL   HDL 40.40 >39.00 mg/dL   VLDL 27.8 0.0 - 40.0 mg/dL   LDL Cholesterol 105 (H) 0 - 99 mg/dL   Total CHOL/HDL Ratio 4     Comment:                Men          Women1/2 Average Risk     3.4           3.3Average Risk          5.0          4.42X Average Risk          9.6          7.13X Average Risk          15.0          11.0                       NonHDL 132.95     Comment: NOTE:  Non-HDL goal should be 30 mg/dL higher than patient's LDL goal (i.e. LDL goal of < 70 mg/dL, would have non-HDL goal of < 100 mg/dL)  PSA     Status: None   Collection Time: 07/22/16 10:30 AM  Result Value Ref Range   PSA 2.95 0.10 - 4.00 ng/mL  Hemoglobin A1c     Status: None   Collection Time: 07/22/16 10:30 AM  Result Value Ref Range   Hgb A1c MFr Bld 4.7 4.6 - 6.5 %    Comment: Glycemic Control Guidelines for People with Diabetes:Non Diabetic:  <6%Goal of Therapy: <7%Additional Action Suggested:  >8%   TSH     Status: None   Collection Time: 07/22/16 10:30 AM  Result Value Ref Range   TSH 1.17 0.35 - 4.50  uIU/mL  Urinalysis, Routine w reflex microscopic     Status: None   Collection Time: 07/22/16 10:30 AM  Result Value Ref Range   Color, Urine YELLOW Yellow;Lt. Yellow   APPearance CLEAR Clear   Specific Gravity, Urine 1.020 1.000 - 1.030   pH 5.5 5.0 - 8.0   Total Protein, Urine NEGATIVE Negative   Urine Glucose NEGATIVE Negative   Ketones, ur NEGATIVE Negative   Bilirubin Urine NEGATIVE Negative   Hgb urine dipstick NEGATIVE Negative   Urobilinogen, UA 0.2 0.0 - 1.0   Leukocytes, UA NEGATIVE Negative   Nitrite NEGATIVE Negative   WBC, UA none seen 0-2/hpf   RBC / HPF 0-2/hpf 0-2/hpf   Squamous Epithelial / LPF Rare(0-4/hpf) Rare(0-4/hpf)   Assessment/Plan: Essential hypertension, benign BP normotensive on current regimen. Asymptomatic. Continue current medication regimen. Labs today.  OSA (obstructive sleep apnea) On CPAP. Wearing as directed. Continue same.  Asymmetric blood pressures BP discrepancy today of only 15 mm hg . Home BP reviewed and there eis usually a 10-20 point difference between R and L blood pressures, sometimes up to > 30. Giving use of new cuff at home and discrepancy  noted here, although much milder, will refer to Vascular for further assessment.   Pelvic floor dysfunction Symptoms seem consistent with a pelvic floor dysfunction or muscle spasm, versus chronic non-infectious prostatitis. No symptoms at present. PSA checked for prostate ca screening UA today. Referral placed back to Urology for further assessment.   Visit for preventive health examination Depression screen negative. Health Maintenance reviewed. Preventive schedule discussed and handout given in AVS. Will obtain fasting labs today.     Leeanne Rio, PA-C

## 2016-07-22 NOTE — Patient Instructions (Addendum)
Please go to the lab for blood work.   Our office will call you with your results unless you have chosen to receive results via MyChart.  If your blood work is normal we will follow-up each year for physicals and as scheduled for chronic medical problems.  If anything is abnormal we will treat accordingly and get you in for a follow-up.  You will be contacted by Urology and Vascular Surgery for appointments.  Preventive Care 40-64 Years, Male Preventive care refers to lifestyle choices and visits with your health care provider that can promote health and wellness. What does preventive care include?  A yearly physical exam. This is also called an annual well check.  Dental exams once or twice a year.  Routine eye exams. Ask your health care provider how often you should have your eyes checked.  Personal lifestyle choices, including:  Daily care of your teeth and gums.  Regular physical activity.  Eating a healthy diet.  Avoiding tobacco and drug use.  Limiting alcohol use.  Practicing safe sex.  Taking low-dose aspirin every day starting at age 67. What happens during an annual well check? The services and screenings done by your health care provider during your annual well check will depend on your age, overall health, lifestyle risk factors, and family history of disease. Counseling  Your health care provider may ask you questions about your:  Alcohol use.  Tobacco use.  Drug use.  Emotional well-being.  Home and relationship well-being.  Sexual activity.  Eating habits.  Work and work Statistician. Screening  You may have the following tests or measurements:  Height, weight, and BMI.  Blood pressure.  Lipid and cholesterol levels. These may be checked every 5 years, or more frequently if you are over 49 years old.  Skin check.  Lung cancer screening. You may have this screening every year starting at age 77 if you have a 30-pack-year history of  smoking and currently smoke or have quit within the past 15 years.  Fecal occult blood test (FOBT) of the stool. You may have this test every year starting at age 5.  Flexible sigmoidoscopy or colonoscopy. You may have a sigmoidoscopy every 5 years or a colonoscopy every 10 years starting at age 63.  Prostate cancer screening. Recommendations will vary depending on your family history and other risks.  Hepatitis C blood test.  Hepatitis B blood test.  Sexually transmitted disease (STD) testing.  Diabetes screening. This is done by checking your blood sugar (glucose) after you have not eaten for a while (fasting). You may have this done every 1-3 years. Discuss your test results, treatment options, and if necessary, the need for more tests with your health care provider. Vaccines  Your health care provider may recommend certain vaccines, such as:  Influenza vaccine. This is recommended every year.  Tetanus, diphtheria, and acellular pertussis (Tdap, Td) vaccine. You may need a Td booster every 10 years.  Varicella vaccine. You may need this if you have not been vaccinated.  Zoster vaccine. You may need this after age 80.  Measles, mumps, and rubella (MMR) vaccine. You may need at least one dose of MMR if you were born in 1957 or later. You may also need a second dose.  Pneumococcal 13-valent conjugate (PCV13) vaccine. You may need this if you have certain conditions and have not been vaccinated.  Pneumococcal polysaccharide (PPSV23) vaccine. You may need one or two doses if you smoke cigarettes or if you have certain  conditions.  Meningococcal vaccine. You may need this if you have certain conditions.  Hepatitis A vaccine. You may need this if you have certain conditions or if you travel or work in places where you may be exposed to hepatitis A.  Hepatitis B vaccine. You may need this if you have certain conditions or if you travel or work in places where you may be exposed to  hepatitis B.  Haemophilus influenzae type b (Hib) vaccine. You may need this if you have certain risk factors. Talk to your health care provider about which screenings and vaccines you need and how often you need them. This information is not intended to replace advice given to you by your health care provider. Make sure you discuss any questions you have with your health care provider. Document Released: 05/15/2015 Document Revised: 01/06/2016 Document Reviewed: 02/17/2015 Elsevier Interactive Patient Education  2017 [employer]. .

## 2016-07-22 NOTE — Progress Notes (Signed)
Pre visit review using our clinic review tool, if applicable. No additional management support is needed unless otherwise documented below in the visit note. 

## 2016-07-24 DIAGNOSIS — M6289 Other specified disorders of muscle: Secondary | ICD-10-CM | POA: Insufficient documentation

## 2016-07-24 DIAGNOSIS — I998 Other disorder of circulatory system: Secondary | ICD-10-CM | POA: Insufficient documentation

## 2016-07-24 NOTE — Assessment & Plan Note (Signed)
Symptoms seem consistent with a pelvic floor dysfunction or muscle spasm, versus chronic non-infectious prostatitis. No symptoms at present. PSA checked for prostate ca screening UA today. Referral placed back to Urology for further assessment.

## 2016-07-24 NOTE — Assessment & Plan Note (Signed)
Depression screen negative. Health Maintenance reviewed. Preventive schedule discussed and handout given in AVS. Will obtain fasting labs today.  

## 2016-07-24 NOTE — Assessment & Plan Note (Addendum)
BP discrepancy today of only 15 mm hg . Home BP reviewed and there eis usually a 10-20 point difference between R and L blood pressures, sometimes up to > 30. Giving use of new cuff at home and discrepancy noted here, although much milder, will refer to Vascular for further assessment.

## 2016-07-24 NOTE — Assessment & Plan Note (Signed)
On CPAP. Wearing as directed. Continue same.

## 2016-07-24 NOTE — Assessment & Plan Note (Signed)
BP normotensive on current regimen. Asymptomatic. Continue current medication regimen. Labs today.

## 2016-08-04 ENCOUNTER — Telehealth: Payer: Self-pay | Admitting: Pulmonary Disease

## 2016-08-05 NOTE — Telephone Encounter (Signed)
Error

## 2016-08-19 ENCOUNTER — Ambulatory Visit: Payer: BLUE CROSS/BLUE SHIELD | Admitting: Pulmonary Disease

## 2016-09-13 ENCOUNTER — Other Ambulatory Visit: Payer: Self-pay

## 2016-09-13 DIAGNOSIS — I998 Other disorder of circulatory system: Secondary | ICD-10-CM

## 2016-09-16 ENCOUNTER — Encounter: Payer: BLUE CROSS/BLUE SHIELD | Admitting: Vascular Surgery

## 2016-09-16 ENCOUNTER — Other Ambulatory Visit: Payer: Self-pay

## 2016-09-16 ENCOUNTER — Ambulatory Visit (HOSPITAL_COMMUNITY)
Admission: RE | Admit: 2016-09-16 | Discharge: 2016-09-16 | Disposition: A | Discharge status: Home / Self Care | Payer: BLUE CROSS/BLUE SHIELD | Source: Ambulatory Visit | Attending: Vascular Surgery | Admitting: Vascular Surgery

## 2016-09-16 DIAGNOSIS — I998 Other disorder of circulatory system: Secondary | ICD-10-CM | POA: Insufficient documentation

## 2016-09-21 ENCOUNTER — Encounter: Payer: Self-pay | Admitting: Vascular Surgery

## 2016-09-22 ENCOUNTER — Encounter: Payer: Self-pay | Admitting: Pulmonary Disease

## 2016-09-23 ENCOUNTER — Encounter: Payer: Self-pay | Admitting: Pulmonary Disease

## 2016-09-23 ENCOUNTER — Ambulatory Visit (INDEPENDENT_AMBULATORY_CARE_PROVIDER_SITE_OTHER): Payer: BLUE CROSS/BLUE SHIELD | Admitting: Pulmonary Disease

## 2016-09-23 DIAGNOSIS — G4733 Obstructive sleep apnea (adult) (pediatric): Secondary | ICD-10-CM

## 2016-09-23 NOTE — Progress Notes (Signed)
Subjective:    Patient ID: Steven Sloan, male    DOB: 05-26-1960, 56 y.o.   MRN: [id number]  HPI  This is the case of Steven Sloan, 56 y.o. Male, who was referred by Dr. Elyn Aquas in consultation regarding OSA.    As you very well know, patient 98 PY smoking history.  Not been diagnosed with asthma or COPD. No lung problems. Patient was diagnosed with obstructive sleep apnea in 2007. He had a lab study then which showed severe sleep apnea per his recollection. Started on CPAP, 10 cm water. That was adjusted through the years.  He is significantly better with his CPAP machine. He has a current machine which is an auto CPAP from 11-14 cm. Current machine he got in 2014. He feels better using the CPAP machine. Less sleepiness. More energy. Less snoring.   He was seeing Dr. Marin Roberts in [place]. [place].   Patient moved to [place] almost 2 years ago. He needs a sleep MD in [place]. He needs a travel CPAP.     ROV  09/23/16 Patient returns to the office as follow-up on sleep apnea. Since last seen, he continues to use  CPAP therapy. Feels better using it. More energy. Less sleepiness. Download the last month: 100%, AHI 2. He is on auto CPAP 10-14 centimeters water. He has A portable CPAP machine which she got last year. He lives in [place] and travels to [place] frequently. He uses that machine when he is in [place]. He feels the pressure is not enough when he uses stat portable CPAP.    Review of Systems  Constitutional: Negative.  Negative for fever and unexpected weight change.       Gained 30 lbs x 2 yrs.   HENT: Positive for congestion, postnasal drip and rhinorrhea. Negative for dental problem, ear pain, nosebleeds, sinus pressure, sneezing, sore throat and trouble swallowing.   Eyes: Positive for redness and itching.  Respiratory: Negative.  Negative for cough, chest tightness, shortness of breath and wheezing.   Cardiovascular:  Negative.  Negative for palpitations and leg swelling.  Gastrointestinal: Negative.  Negative for nausea and vomiting.  Endocrine: Negative.   Genitourinary: Negative.  Negative for dysuria.  Musculoskeletal: Negative.  Negative for joint swelling.  Skin: Negative.  Negative for rash.  Allergic/Immunologic: Positive for environmental allergies.  Neurological: Negative.  Negative for headaches.  Hematological: Negative.  Does not bruise/bleed easily.  Psychiatric/Behavioral: Negative.  Negative for dysphoric mood. The patient is not nervous/anxious.   All other systems reviewed and are negative.      Objective:   Physical Exam  Vitals:  Vitals:   09/23/16 1046  BP: 118/70  Pulse: 71  SpO2: 96%  Weight: 254 lb (115.2 kg)  Height: 5\' 11"  (1.803 m)    Constitutional/General:  Pleasant, well-nourished, well-developed, not in any distress,  Comfortably seating.  Well kempt  Body mass index is 35.43 kg/m. Wt Readings from Last 3 Encounters:  09/23/16 254 lb (115.2 kg)  07/22/16 261 lb (118.4 kg)  05/31/16 269 lb (122 kg)    Neck circumference: 18.5 inches  HEENT: Pupils equal and reactive to light and accommodation. Anicteric sclerae. Normal nasal mucosa.   No oral  lesions,  mouth clear,  oropharynx clear, no postnasal drip. (-) Oral thrush. No dental caries.  Airway - Mallampati class III  Neck: No masses. Midline trachea. No JVD, (-) LAD. (-) bruits appreciated.  Respiratory/Chest: Grossly normal chest. (-) deformity. (-) Accessory muscle  use.  Symmetric expansion. (-) Tenderness on palpation.  Resonant on percussion.  Diminished BS on both lower lung zones. (-) wheezing, crackles, rhonchi (-) egophony  Cardiovascular: Regular rate and  rhythm, heart sounds normal, no murmur or gallops, no peripheral edema  Gastrointestinal:  Normal bowel sounds. Soft, non-tender. No hepatosplenomegaly.  (-) masses.   Musculoskeletal:  Normal muscle tone. Normal gait.    Extremities: Grossly normal. (-) clubbing, cyanosis.  (-) edema  Skin: (-) rash,lesions seen.   Neurological/Psychiatric : alert, oriented to time, place, person. Normal mood and affect           Assessment & Plan:  OSA (obstructive sleep apnea) Patient was diagnosed with obstructive sleep apnea in 2007. He had a lab study then which showed severe sleep apnea per his recollection. AHI 40-60 (?). Started on CPAP, 10 cm water. That was adjusted through the years.  He is significantly better with his CPAP machine. He has a current machine which is an auto CPAP from 10-14 cm. Current machine he got in 2014. He feels better using the CPAP machine. Less sleepiness. More energy. Less snoring.   He was seeing Dr. Marin Roberts in [place]. [place].   DME [facility] in [place].  He has a travel cpap as well he got in 2017. Feels better using the travel CPAP. He feels the pressure is not too much compared to his bigger CPAP machine.  Plan :  We extensively discussed the importance of treating OSA and the need to use PAP therapy.   Continue with his cpap machines: 1. Bigger cpap set at 10-14 cm water.  He got in 2014.  2. Portable HDM Z1, set at 10-14 as well. He got in 2017 >> he feels pressure is not enough.  Plan to set cpap settings to 15 cm water and discontinue ramp as well if possible. We will send orders to his DME in [place]   Patient was instructed to have mask, tubings, filter, reservoir cleaned at least once a week with soapy water.  Patient was instructed to call the office if he/she is having issues with the PAP device.    I advised patient to obtain sufficient amount of sleep --  7 to 8 hours at least in a 24 hr period.  Patient was advised to follow good sleep hygiene.  Patient was advised NOT to engage in activities requiring concentration and/or vigilance if he/she is and  sleepy.  Patient is NOT to drive if he/she is sleepy.        Patient will follow up with [place] in a year   J. Shirl Harris, MD [facility] [place] Pager: [phone] Office: [phone], Fax: [phone]

## 2016-09-23 NOTE — Patient Instructions (Signed)
  It was a pleasure taking care of you today!  Continue using your CPAP machine. We will call your DME in [place] to make the changes on your  portable CPAP.  Please make sure you use your CPAP device everytime you sleep.  We will monitor the usage of your machine per your insurance requirement.  Your insurance company may take the machine from you if you are not using it regularly.   Please clean the mask, tubings, filter, water reservoir with soapy water every week.  Please use distilled water for the water reservoir.   Please call the office or your machine provider (DME company) if you are having issues with the device.   Return to clinic in 1 year with NP

## 2016-09-23 NOTE — Assessment & Plan Note (Addendum)
Patient was diagnosed with obstructive sleep apnea in 2007. He had a lab study then which showed severe sleep apnea per his recollection. AHI 40-60 (?). Started on CPAP, 10 cm water. That was adjusted through the years.  He is significantly better with his CPAP machine. He has a current machine which is an auto CPAP from 10-14 cm. Current machine he got in 2014. He feels better using the CPAP machine. Less sleepiness. More energy. Less snoring.   He was seeing Dr. Marin Roberts in [place]. [place].   DME is [facility] in [place].  He has a travel cpap as well he got in 2017. Feels better using the travel CPAP. He feels the pressure is not too much compared to his bigger CPAP machine.  Plan :  We extensively discussed the importance of treating OSA and the need to use PAP therapy.   Continue with his cpap machines: 1. Bigger cpap set at 10-14 cm water.  He got in 2014.  2. Portable HDM Z1, set at 10-14 as well. He got in 2017 >> he feels pressure is not enough.  Plan to set cpap settings to 15 cm water and discontinue ramp as well if possible. We will send orders to his DME in [place]   Patient was instructed to have mask, tubings, filter, reservoir cleaned at least once a week with soapy water.  Patient was instructed to call the office if he/she is having issues with the PAP device.    I advised patient to obtain sufficient amount of sleep --  7 to 8 hours at least in a 24 hr period.  Patient was advised to follow good sleep hygiene.  Patient was advised NOT to engage in activities requiring concentration and/or vigilance if he/she is and  sleepy.  Patient is NOT to drive if he/she is sleepy.

## 2016-09-30 ENCOUNTER — Encounter: Payer: Self-pay | Admitting: Vascular Surgery

## 2016-09-30 ENCOUNTER — Ambulatory Visit (INDEPENDENT_AMBULATORY_CARE_PROVIDER_SITE_OTHER): Payer: BLUE CROSS/BLUE SHIELD | Admitting: Vascular Surgery

## 2016-09-30 VITALS — BP 128/72 | HR 70 | Temp 98.0°F | Resp 18 | Ht 71.0 in | Wt 254.0 lb

## 2016-09-30 DIAGNOSIS — I739 Peripheral vascular disease, unspecified: Secondary | ICD-10-CM | POA: Diagnosis not present

## 2016-09-30 NOTE — Progress Notes (Signed)
Vitals:   09/30/16 1010  BP: 132/79  Pulse: 70  Resp: 18  Temp: 98 F (36.7 C)  SpO2: 97%  Weight: 254 lb (115.2 kg)  Height: 5\' 11"  (1.803 m)

## 2016-09-30 NOTE — Progress Notes (Signed)
Patient ID: Steven Sloan, male   DOB: October 30, 1960, 56 y.o.   MRN: [id number]  Reason for Consult: New Evaluation   Referred by Steven Jeans, PA-C  Subjective:     HPI:  Steven Sloan is a 56 y.o. male with history of hypertension and obstructive sleep apnea. He also has borderline hypertension and monitors his blood pressures at home. He states that the right arm frequently leads approximately 30 points higher than the left. He denies any symptoms of pain with use or fatigue and has bilateral upper extremities. He is a former smoker for 30 years having recently quit in February of this year. He also has obesity but is attempting to lose weight at this time. He does complain of occasional numbness posterior right upper extremity that is positional in nature. He is able to walk without any limitations. He does take aspirin daily.  Past Medical History:  Diagnosis Date  . Allergy    Seasonal  . Chicken pox   . Heart murmur    Asymptomatic -- Patient endorses previous negative evaluation  . Hypertension   . Sleep apnea    CPAP nightly   Family History  Problem Relation Age of Onset  . Heart attack Father 12  . Heart disease Father   . Alcohol abuse Sister   . Heart attack Paternal Uncle 2  . Heart attack Paternal Grandfather 89  . Cancer Maternal Grandmother 40       Unsure   Past Surgical History:  Procedure Laterality Date  . COLONOSCOPY    . NO PAST SURGERIES    . WISDOM TOOTH EXTRACTION      Short Social History:  Social History  Substance Use Topics  . Smoking status: Former Smoker    Packs/day: 0.25    Years: 25.00    Types: Cigarettes    Quit date: 05/17/2016  . Smokeless tobacco: Never Used  . Alcohol use 1.2 - 2.4 oz/week    2 - 4 Standard drinks or equivalent per week     Comment: social    No Known Allergies  Current Outpatient Prescriptions  Medication Sig Dispense Refill  . amLODipine (NORVASC) 10 MG tablet Take 1 tablet (10 mg total) by  mouth daily. 90 tablet 1  . aspirin EC 81 MG tablet Take 81 mg by mouth daily.    Marland Kitchen azelastine (ASTELIN) 0.1 % nasal spray Place 1 spray into both nostrils 2 (two) times daily. Use in each nostril as directed 30 mL 3  . fluticasone (FLONASE) 50 MCG/ACT nasal spray Place 2 sprays into both nostrils daily. 16 g 3  . ibuprofen (ADVIL,MOTRIN) 600 MG tablet Take 1 tablet by mouth as needed.  0  . lisinopril (PRINIVIL,ZESTRIL) 10 MG tablet Take 1 tablet (10 mg total) by mouth daily. 90 tablet 1  . Nutritional Supplements (JUICE PLUS FIBRE PO) Take 6 each by mouth daily.    . Omega-3 Fatty Acids (FISH OIL) 1200 MG CAPS Take 2 capsules by mouth daily.     . psyllium (METAMUCIL) 0.52 g capsule Take 0.52 g by mouth daily.    . tadalafil (CIALIS) 10 MG tablet Take 1 tablet (10 mg total) by mouth daily as needed for erectile dysfunction. 6 tablet 6  . terazosin (HYTRIN) 1 MG capsule Take 1 capsule (1 mg total) by mouth at bedtime. 90 capsule 1   No current facility-administered medications for this visit.     Review of Systems  Constitutional:  Constitutional negative. HENT: HENT  negative.  Eyes: Eyes negative.  Respiratory: Respiratory negative.  Cardiovascular: Cardiovascular negative.  GI: Gastrointestinal negative.  Musculoskeletal: Positive for leg pain.  Skin: Skin negative.  Neurological: Positive for numbness.  Hematologic: Hematologic/lymphatic negative.  Psychiatric: Psychiatric negative.        Objective:  Objective   Vitals:   09/30/16 1010 09/30/16 1011  BP: 132/79 128/72  Pulse: 70 70  Resp: 18   Temp: 98 F (36.7 C)   SpO2: 97%   Weight: 254 lb (115.2 kg)   Height: 5\' 11"  (1.803 m)    Body mass index is 35.43 kg/m.  Physical Exam  Constitutional: He is oriented to person, place, and time. He appears well-developed.  HENT:  Head: Normocephalic.  Eyes: Pupils are equal, round, and reactive to light.  Neck: Normal range of motion.  Cardiovascular: Normal rate.     Pulses:      Radial pulses are 2+ on the right side, and 2+ on the left side.       Popliteal pulses are 2+ on the right side, and 2+ on the left side.       Dorsalis pedis pulses are 2+ on the right side, and 2+ on the left side.  Pulmonary/Chest: Effort normal.  Abdominal: Soft. He exhibits no mass.  Musculoskeletal: Normal range of motion. He exhibits no edema.  Neurological: He is alert and oriented to person, place, and time.  Skin: Skin is warm and dry.  Psychiatric: He has a normal mood and affect. His behavior is normal. Judgment and thought content normal.    Data: Bilateral upper extremity duplexes were previously interpreted by me demonstrate triphasic waveforms throughout without evidence of obstruction.     Assessment/Plan:     57 year old male presents for evaluation of  bilateral upper extremity blood pressures. In our office today the blood pressures were very close only 4 points between the 2 arms. He also has palpable radial pulses bilaterally as well as brachial pulses. His duplex does not demonstrate any disease and a previous CT scan in 2016 did not demonstrate any proximal arterial disease either. I have instructed him to likely use the higher of the 2 pressures and calibrate his machine. He should also take aspirin given his risk factors of at least borderline hypertension, obesity and long history of smoking. He does not require any vascular intervention at this time and can follow-up on a when necessary basis.     Steven Sandy MD [facility]

## 2016-10-04 ENCOUNTER — Encounter: Payer: Self-pay | Admitting: Physician Assistant

## 2016-10-13 NOTE — Progress Notes (Signed)
Patient presents to clinic today c/o potential fungal infection of R great toe. Patient endorses history of onychomycosis a few years prior treated with antifungals. Has noted recurrence of symptoms. First noted symptoms a few weeks ago. Endorses thickened and yellowing portion of distal nail. Denies pain, swelling. Denies noting similar changes of other nails.  Past Medical History:  Diagnosis Date  . Allergy    Seasonal  . Chicken pox   . Heart murmur    Asymptomatic -- Patient endorses previous negative evaluation  . Hypertension   . Sleep apnea    CPAP nightly    Current Outpatient Prescriptions on File Prior to Visit  Medication Sig Dispense Refill  . amLODipine (NORVASC) 10 MG tablet Take 1 tablet (10 mg total) by mouth daily. 90 tablet 1  . aspirin EC 81 MG tablet Take 81 mg by mouth daily.    Marland Kitchen azelastine (ASTELIN) 0.1 % nasal spray Place 1 spray into both nostrils 2 (two) times daily. Use in each nostril as directed 30 mL 3  . fluticasone (FLONASE) 50 MCG/ACT nasal spray Place 2 sprays into both nostrils daily. 16 g 3  . ibuprofen (ADVIL,MOTRIN) 600 MG tablet Take 1 tablet by mouth as needed.  0  . lisinopril (PRINIVIL,ZESTRIL) 10 MG tablet Take 1 tablet (10 mg total) by mouth daily. 90 tablet 1  . Nutritional Supplements (JUICE PLUS FIBRE PO) Take 6 each by mouth daily.    . Omega-3 Fatty Acids (FISH OIL) 1200 MG CAPS Take 2 capsules by mouth daily.     . psyllium (METAMUCIL) 0.52 g capsule Take 0.52 g by mouth daily.    . tadalafil (CIALIS) 10 MG tablet Take 1 tablet (10 mg total) by mouth daily as needed for erectile dysfunction. 6 tablet 6  . terazosin (HYTRIN) 1 MG capsule Take 1 capsule (1 mg total) by mouth at bedtime. 90 capsule 1   No current facility-administered medications on file prior to visit.     No Known Allergies  Family History  Problem Relation Age of Onset  . Heart attack Father 84  . Heart disease Father   . Alcohol abuse Sister   . Heart  attack Paternal Uncle 51  . Heart attack Paternal Grandfather 46  . Cancer Maternal Grandmother 36       Unsure    Social History   Social History  . Marital status: Married    Spouse name: N/A  . Number of children: N/A  . Years of education: N/A   Social History Main Topics  . Smoking status: Former Smoker    Packs/day: 0.25    Years: 25.00    Types: Cigarettes    Quit date: 05/17/2016  . Smokeless tobacco: Never Used  . Alcohol use 1.2 - 2.4 oz/week    2 - 4 Standard drinks or equivalent per week     Comment: social  . Drug use: No  . Sexual activity: Yes   Other Topics Concern  . Not on file   Social History Narrative  . No narrative on file   Review of Systems - See HPI.  All other ROS are negative.  There were no vitals taken for this visit.  Physical Exam  Constitutional: He is well-developed, well-nourished, and in no distress.  HENT:  Head: Normocephalic and atraumatic.  Eyes: Conjunctivae are normal.  Cardiovascular: Normal rate, regular rhythm, normal heart sounds and intact distal pulses.   Pulmonary/Chest: Effort normal and breath sounds normal.  Skin:  Skin is warm and dry. No rash noted.  Nails for bilateral feet examined. There is thickening and discoloration of the distal R great toenail. No evidence of trauma or injury. No evidence of hyperkeratotic plaque underneath the nail. Mild brittleness of nails noted.   Psychiatric: Affect normal.  Vitals reviewed.   Recent Results (from the past 2160 hour(s))  CBC     Status: None   Collection Time: 07/22/16 10:30 AM  Result Value Ref Range   WBC 6.7 4.0 - 10.5 K/uL   RBC 4.68 4.22 - 5.81 Mil/uL   Platelets 229.0 150.0 - 400.0 K/uL   Hemoglobin 14.9 13.0 - 17.0 g/dL   HCT 43.7 39.0 - 52.0 %   MCV 93.4 78.0 - 100.0 fl   MCHC 34.1 30.0 - 36.0 g/dL   RDW 12.8 11.5 - 15.5 %  Comprehensive metabolic panel     Status: Abnormal   Collection Time: 07/22/16 10:30 AM  Result Value Ref Range   Sodium 139  135 - 145 mEq/L   Potassium 4.6 3.5 - 5.1 mEq/L   Chloride 104 96 - 112 mEq/L   CO2 30 19 - 32 mEq/L   Glucose, Bld 109 (H) 70 - 99 mg/dL   BUN 19 6 - 23 mg/dL   Creatinine, Ser 1.32 0.40 - 1.50 mg/dL   Total Bilirubin 1.3 (H) 0.2 - 1.2 mg/dL   Alkaline Phosphatase 42 39 - 117 U/L   AST 16 0 - 37 U/L   ALT 25 0 - 53 U/L   Total Protein 7.4 6.0 - 8.3 g/dL   Albumin 4.7 3.5 - 5.2 g/dL   Calcium 9.7 8.4 - 10.5 mg/dL   GFR 60.22 >60.00 mL/min  Lipid panel     Status: Abnormal   Collection Time: 07/22/16 10:30 AM  Result Value Ref Range   Cholesterol 173 0 - 200 mg/dL    Comment: ATP III Classification       Desirable:  < 200 mg/dL               Borderline High:  200 - 239 mg/dL          High:  > = 240 mg/dL   Triglycerides 139.0 0.0 - 149.0 mg/dL    Comment: Normal:  <150 mg/dLBorderline High:  150 - 199 mg/dL   HDL 40.40 >39.00 mg/dL   VLDL 27.8 0.0 - 40.0 mg/dL   LDL Cholesterol 105 (H) 0 - 99 mg/dL   Total CHOL/HDL Ratio 4     Comment:                Men          Women1/2 Average Risk     3.4          3.3Average Risk          5.0          4.42X Average Risk          9.6          7.13X Average Risk          15.0          11.0                       NonHDL 132.95     Comment: NOTE:  Non-HDL goal should be 30 mg/dL higher than patient's LDL goal (i.e. LDL goal of < 70 mg/dL, would have non-HDL goal of < 100 mg/dL)  PSA  Status: None   Collection Time: 07/22/16 10:30 AM  Result Value Ref Range   PSA 2.95 0.10 - 4.00 ng/mL  Hemoglobin A1c     Status: None   Collection Time: 07/22/16 10:30 AM  Result Value Ref Range   Hgb A1c MFr Bld 4.7 4.6 - 6.5 %    Comment: Glycemic Control Guidelines for People with Diabetes:Non Diabetic:  <6%Goal of Therapy: <7%Additional Action Suggested:  >8%   TSH     Status: None   Collection Time: 07/22/16 10:30 AM  Result Value Ref Range   TSH 1.17 0.35 - 4.50 uIU/mL  Urinalysis, Routine w reflex microscopic     Status: None   Collection Time:  07/22/16 10:30 AM  Result Value Ref Range   Color, Urine YELLOW Yellow;Lt. Yellow   APPearance CLEAR Clear   Specific Gravity, Urine 1.020 1.000 - 1.030   pH 5.5 5.0 - 8.0   Total Protein, Urine NEGATIVE Negative   Urine Glucose NEGATIVE Negative   Ketones, ur NEGATIVE Negative   Bilirubin Urine NEGATIVE Negative   Hgb urine dipstick NEGATIVE Negative   Urobilinogen, UA 0.2 0.0 - 1.0   Leukocytes, UA NEGATIVE Negative   Nitrite NEGATIVE Negative   WBC, UA none seen 0-2/hpf   RBC / HPF 0-2/hpf 0-2/hpf   Squamous Epithelial / LPF Rare(0-4/hpf) Rare(0-4/hpf)    Assessment/Plan: 1. Onychomycosis Mild. Discussed supportive measures and natural antifungal treatments. Will start Penlac lacquer Rx. Follow-up scheduled. If not improving will start oral terbinafine as patient has tolerated well previously and recent LFTs within normal limits.  Leeanne Rio, PA-C

## 2016-10-14 ENCOUNTER — Ambulatory Visit (INDEPENDENT_AMBULATORY_CARE_PROVIDER_SITE_OTHER): Payer: BLUE CROSS/BLUE SHIELD | Admitting: Physician Assistant

## 2016-10-14 ENCOUNTER — Encounter: Payer: Self-pay | Admitting: Physician Assistant

## 2016-10-14 VITALS — BP 120/70 | HR 65 | Temp 98.0°F | Resp 14 | Ht 71.0 in | Wt 250.0 lb

## 2016-10-14 DIAGNOSIS — B351 Tinea unguium: Secondary | ICD-10-CM | POA: Diagnosis not present

## 2016-10-14 MED ORDER — CICLOPIROX 8 % EX SOLN: Freq: Every day | CUTANEOUS | 0 refills | Status: DC

## 2016-10-14 NOTE — Patient Instructions (Signed)
Do the vinegar soaks that we discussed.  Start the Penlac lacquer to help treat infection. Should see improvement with symptoms in a few weeks.  Let me know if not improving and we will start Lamisil.   We will reassess in 3-4 weeks.

## 2016-10-14 NOTE — Progress Notes (Signed)
Pre visit review using our clinic review tool, if applicable. No additional management support is needed unless otherwise documented below in the visit note. 

## 2016-10-20 ENCOUNTER — Encounter: Payer: Self-pay | Admitting: Physician Assistant

## 2016-10-20 ENCOUNTER — Other Ambulatory Visit: Payer: Self-pay | Admitting: Emergency Medicine

## 2016-10-20 MED ORDER — AMLODIPINE BESYLATE 10 MG PO TABS: 10.0000 mg | ORAL_TABLET | Freq: Every day | ORAL | 1 refills | Status: DC

## 2016-10-20 MED ORDER — TERAZOSIN HCL 1 MG PO CAPS: 1.0000 mg | ORAL_CAPSULE | Freq: Every day | ORAL | 1 refills | Status: DC

## 2016-10-20 MED ORDER — LISINOPRIL 10 MG PO TABS
10.0000 mg | ORAL_TABLET | Freq: Every day | ORAL | 1 refills | Status: DC
Start: 2016-10-20 — End: 2016-11-09

## 2016-10-20 MED ORDER — FLUTICASONE PROPIONATE 50 MCG/ACT NA SUSP: 2.0000 | Freq: Every day | NASAL | 1 refills | Status: DC

## 2016-10-20 MED ORDER — LISINOPRIL 10 MG PO TABS: 10.0000 mg | ORAL_TABLET | Freq: Every day | ORAL | 1 refills | Status: DC

## 2016-10-20 NOTE — Addendum Note (Signed)
Addended by: Leonidas Romberg on: 10/20/2016 04:34 PM   Modules accepted: Orders

## 2016-11-09 ENCOUNTER — Other Ambulatory Visit: Payer: Self-pay | Admitting: Emergency Medicine

## 2016-11-09 ENCOUNTER — Encounter: Payer: Self-pay | Admitting: Physician Assistant

## 2016-11-09 MED ORDER — LISINOPRIL 10 MG PO TABS: 10.0000 mg | ORAL_TABLET | Freq: Every day | ORAL | 1 refills | Status: DC

## 2016-11-09 MED ORDER — FLUTICASONE PROPIONATE 50 MCG/ACT NA SUSP: 2.0000 | Freq: Every day | NASAL | 1 refills | Status: DC

## 2016-11-09 MED ORDER — TERAZOSIN HCL 1 MG PO CAPS: 1.0000 mg | ORAL_CAPSULE | Freq: Every day | ORAL | 1 refills | Status: DC

## 2016-11-09 MED ORDER — AMLODIPINE BESYLATE 10 MG PO TABS: 10.0000 mg | ORAL_TABLET | Freq: Every day | ORAL | 1 refills | Status: DC

## 2017-01-30 ENCOUNTER — Ambulatory Visit (INDEPENDENT_AMBULATORY_CARE_PROVIDER_SITE_OTHER): Payer: BLUE CROSS/BLUE SHIELD

## 2017-01-30 DIAGNOSIS — Z23 Encounter for immunization: Secondary | ICD-10-CM | POA: Diagnosis not present

## 2017-03-07 DIAGNOSIS — R102 Pelvic and perineal pain: Secondary | ICD-10-CM | POA: Diagnosis not present

## 2017-03-07 DIAGNOSIS — M6281 Muscle weakness (generalized): Secondary | ICD-10-CM | POA: Diagnosis not present

## 2017-03-07 DIAGNOSIS — N50812 Left testicular pain: Secondary | ICD-10-CM | POA: Diagnosis not present

## 2017-03-07 DIAGNOSIS — M62838 Other muscle spasm: Secondary | ICD-10-CM | POA: Diagnosis not present

## 2017-03-20 DIAGNOSIS — R972 Elevated prostate specific antigen [PSA]: Secondary | ICD-10-CM | POA: Diagnosis not present

## 2017-03-27 DIAGNOSIS — N411 Chronic prostatitis: Secondary | ICD-10-CM | POA: Diagnosis not present

## 2017-03-27 DIAGNOSIS — N50812 Left testicular pain: Secondary | ICD-10-CM | POA: Diagnosis not present

## 2017-03-27 DIAGNOSIS — R102 Pelvic and perineal pain: Secondary | ICD-10-CM | POA: Diagnosis not present

## 2017-04-17 ENCOUNTER — Other Ambulatory Visit: Payer: Self-pay | Admitting: Emergency Medicine

## 2017-06-07 ENCOUNTER — Other Ambulatory Visit: Payer: Self-pay

## 2017-06-07 ENCOUNTER — Ambulatory Visit (INDEPENDENT_AMBULATORY_CARE_PROVIDER_SITE_OTHER): Payer: BLUE CROSS/BLUE SHIELD | Admitting: Physician Assistant

## 2017-06-07 ENCOUNTER — Encounter: Payer: Self-pay | Admitting: Physician Assistant

## 2017-06-07 VITALS — BP 126/70 | HR 49 | Temp 98.5°F | Resp 14 | Ht 71.0 in | Wt 257.0 lb

## 2017-06-07 DIAGNOSIS — Z6837 Body mass index (BMI) 37.0-37.9, adult: Secondary | ICD-10-CM | POA: Diagnosis not present

## 2017-06-07 DIAGNOSIS — I998 Other disorder of circulatory system: Secondary | ICD-10-CM

## 2017-06-07 DIAGNOSIS — Z125 Encounter for screening for malignant neoplasm of prostate: Secondary | ICD-10-CM | POA: Diagnosis not present

## 2017-06-07 DIAGNOSIS — K648 Other hemorrhoids: Secondary | ICD-10-CM

## 2017-06-07 DIAGNOSIS — G4733 Obstructive sleep apnea (adult) (pediatric): Secondary | ICD-10-CM

## 2017-06-07 DIAGNOSIS — I10 Essential (primary) hypertension: Secondary | ICD-10-CM | POA: Diagnosis not present

## 2017-06-07 LAB — COMPREHENSIVE METABOLIC PANEL
ALBUMIN: 4.5 g/dL (ref 3.5–5.2)
ALK PHOS: 47 U/L (ref 39–117)
ALT: 21 U/L (ref 0–53)
AST: 15 U/L (ref 0–37)
BUN: 17 mg/dL (ref 6–23)
CALCIUM: 9.6 mg/dL (ref 8.4–10.5)
CO2: 31 meq/L (ref 19–32)
Chloride: 103 mEq/L (ref 96–112)
Creatinine, Ser: 1.31 mg/dL (ref 0.40–1.50)
GFR: 60.55 mL/min (ref >60.00)
Glucose, Bld: 113 mg/dL — ABNORMAL HIGH (ref 70–99)
Potassium: 4.4 mEq/L (ref 3.5–5.1)
Sodium: 139 mEq/L (ref 135–145)
TOTAL PROTEIN: 7.4 g/dL (ref 6.0–8.3)
Total Bilirubin: 1.5 mg/dL — ABNORMAL HIGH (ref 0.2–1.2)

## 2017-06-07 LAB — LIPID PANEL
CHOL/HDL RATIO: 5
Cholesterol: 181 mg/dL (ref 0–200)
HDL: 39.8 mg/dL (ref >39.00)
LDL Cholesterol: 119 mg/dL — ABNORMAL HIGH (ref 0–99)
NonHDL: 140.94
TRIGLYCERIDES: 108 mg/dL (ref 0.0–149.0)
VLDL: 21.6 mg/dL (ref 0.0–40.0)

## 2017-06-07 LAB — HEMOGLOBIN A1C: Hgb A1c MFr Bld: 5 % (ref 4.6–6.5)

## 2017-06-07 LAB — HEMOCCULT GUIAC POC 1CARD (OFFICE): Fecal Occult Blood, POC: NEGATIVE

## 2017-06-07 LAB — PSA: PSA: 4.81 ng/mL — AB (ref 0.10–4.00)

## 2017-06-07 MED ORDER — HYDROCORTISONE ACETATE 25 MG RE SUPP: 25.0000 mg | Freq: Two times a day (BID) | RECTAL | 0 refills | Status: DC

## 2017-06-07 MED ORDER — TERAZOSIN HCL 1 MG PO CAPS: 1.0000 mg | ORAL_CAPSULE | Freq: Every day | ORAL | 1 refills | Status: DC

## 2017-06-07 MED ORDER — AMLODIPINE BESYLATE 5 MG PO TABS: 5.0000 mg | ORAL_TABLET | Freq: Every day | ORAL | 1 refills | Status: DC

## 2017-06-07 MED ORDER — LISINOPRIL 10 MG PO TABS: 10.0000 mg | ORAL_TABLET | Freq: Every day | ORAL | 1 refills | Status: DC

## 2017-06-07 NOTE — Patient Instructions (Addendum)
Please go to the lab today for blood work.  I will call you with your results. We will alter treatment regimen(s) if indicated by your results.   Please use the rectal cream as directed to help with internal hemorrhoid. Keep well-hydrated and keep up with good fiber in the diet.   We will be altering your BP regimen due to low pulse.  Please decrease Amlodipine to 5 mg daily. I have sent a new prescription your local pharmacy to try out this lower dose. Keep a close watch on BP and pulse and record daily. Call me in 1 week with these readings.

## 2017-06-07 NOTE — Assessment & Plan Note (Signed)
DRE performed and within normal limits. The natural history of prostate cancer and ongoing controversy regarding screening and potential treatment outcomes of prostate cancer has been discussed with the patient. The meaning of a false positive PSA and a false negative PSA has been discussed. He indicates understanding of the limitations of this screening test and wishes to proceed with screening PSA testing.

## 2017-06-07 NOTE — Assessment & Plan Note (Signed)
Body mass index is 35.84 kg/m. Is working harder on diet and exercise. Recommendations reviewed with patient. Will keep an eye on things.

## 2017-06-07 NOTE — Progress Notes (Signed)
Patient presents to clinic today for follow-up of hypertension. Patient also with acute concerns today.  Patient is currently on a regimen of amlodipine 10 mg daily and lisinopril 10 mg daily. Is taking as directed without side effect. Is taking 81 mg ASA daily. Has recently started diet and exercise regimen. Is calorie counting and is running on the treadmill 3-5 x week. Patient denies chest pain, palpitations, lightheadedness, dizziness, vision changes or frequent headaches.  BP Readings from Last 3 Encounters:  06/07/17 126/70  10/14/16 120/70  09/30/16 128/72   Patient also endorses intermittent BRBPR. Has had this issue previously and was told he had internal hemorrhoids. Denies excess straining with BM. Denies melena or tenesmus. Denies change in bowel habits. Denies sensation of a rectal mass. Is trying to keep hydrated and follow a diet high in fiber. Is up-to-date on Colonoscopy, having in 2017 where polyps were noted. Recall in 2022 per Gastroenterology.   Past Medical History:  Diagnosis Date  . Allergy    Seasonal  . Chicken pox   . Heart murmur    Asymptomatic -- Patient endorses previous negative evaluation  . Hypertension   . Sleep apnea    CPAP nightly    Current Outpatient Medications on File Prior to Visit  Medication Sig Dispense Refill  . aspirin EC 81 MG tablet Take 81 mg by mouth daily.    Marland Kitchen azelastine (ASTELIN) 0.1 % nasal spray Place 1 spray into both nostrils 2 (two) times daily. Use in each nostril as directed 30 mL 3  . fluticasone (FLONASE) 50 MCG/ACT nasal spray Place 2 sprays into both nostrils daily. 48 g 1  . ibuprofen (ADVIL,MOTRIN) 600 MG tablet Take 1 tablet by mouth as needed.  0  . Multiple Vitamins-Minerals (CENTRUM ADULTS) TABS Take 1 tablet by mouth daily.    . Omega-3 Fatty Acids (FISH OIL) 1200 MG CAPS Take 2 capsules by mouth daily.     . psyllium (METAMUCIL) 0.52 g capsule Take 0.52 g by mouth daily.    . tadalafil (CIALIS) 10 MG tablet  Take 1 tablet (10 mg total) by mouth daily as needed for erectile dysfunction. 6 tablet 6   No current facility-administered medications on file prior to visit.     No Known Allergies  Family History  Problem Relation Age of Onset  . Heart attack Father 56  . Heart disease Father   . Alcohol abuse Sister   . Heart attack Paternal Uncle 3  . Heart attack Paternal Grandfather 51  . Cancer Maternal Grandmother 75       Unsure    Social History   Socioeconomic History  . Marital status: Married    Spouse name: None  . Number of children: None  . Years of education: None  . Highest education level: None  Social Needs  . Financial resource strain: None  . Food insecurity - worry: None  . Food insecurity - inability: None  . Transportation needs - medical: None  . Transportation needs - non-medical: None  Occupational History  . None  Tobacco Use  . Smoking status: Former Smoker    Packs/day: 0.25    Years: 25.00    Pack years: 6.25    Types: Cigarettes    Last attempt to quit: 05/17/2016    Years since quitting: 1.0  . Smokeless tobacco: Never Used  Substance and Sexual Activity  . Alcohol use: Yes    Alcohol/week: 1.2 - 2.4 oz    Types: 2 -  4 Standard drinks or equivalent per week    Comment: social  . Drug use: No  . Sexual activity: Yes  Other Topics Concern  . None  Social History Narrative  . None   Review of Systems - See HPI.  All other ROS are negative.  BP 126/70   Pulse (!) 49   Temp 98.5 F (36.9 C) (Oral)   Resp 14   Ht 5\' 11"  (1.803 m)   Wt 257 lb (116.6 kg)   SpO2 98%   BMI 35.84 kg/m   Physical Exam  Constitutional: He is oriented to person, place, and time and well-developed, well-nourished, and in no distress.  HENT:  Head: Normocephalic and atraumatic.  Eyes: Conjunctivae are normal.  Neck: Neck supple.  Cardiovascular: Regular rhythm and intact distal pulses. Bradycardia present.  Pulmonary/Chest: Effort normal and breath sounds  normal. No respiratory distress. He has no wheezes. He has no rales. He exhibits no tenderness.  Genitourinary: Prostate normal. Rectal exam shows internal hemorrhoid. Rectal exam shows no external hemorrhoid, no fissure, no tenderness and guaiac negative stool.  Genitourinary Comments: Chaperone present for rectal examination  Neurological: He is alert and oriented to person, place, and time.  Skin: Skin is warm and dry.  Psychiatric: Affect normal.  Vitals reviewed.  Assessment/Plan: Essential hypertension, benign BP normotensive. Asymptomatic. Mild bradycardia noted. Will decrease Amlodipine to 5 mg daily. Continue other medications as directed. Keep up with diet and exercise. Patient to check home BP and pulse daily over the next week and call me with readings so further adjustments can be made. CMP today.   OSA (obstructive sleep apnea) Wearing CPAP as directed. Is working on weight loss. BP well-controlled. Follow-up with Pulmonology as scheduled.   Obesity Body mass index is 35.84 kg/m. Is working harder on diet and exercise. Recommendations reviewed with patient. Will keep an eye on things.   Asymmetric blood pressures BP even in both arms. Nothing further needed.   Internal hemorrhoid DRE without hard mass. No noted fissure. Some sentinal tags noted from prior external hemorrhoids. Internal hemorrhoid palpable. Will start rectal hydrocortisone and supportive measures. Colorectal specialist if things are not resolving.   Prostate cancer screening DRE performed and within normal limits. The natural history of prostate cancer and ongoing controversy regarding screening and potential treatment outcomes of prostate cancer has been discussed with the patient. The meaning of a false positive PSA and a false negative PSA has been discussed. He indicates understanding of the limitations of this screening test and wishes to proceed with screening PSA testing.     Leeanne Rio,  PA-C

## 2017-06-07 NOTE — Assessment & Plan Note (Signed)
DRE without hard mass. No noted fissure. Some sentinal tags noted from prior external hemorrhoids. Internal hemorrhoid palpable. Will start rectal hydrocortisone and supportive measures. Colorectal specialist if things are not resolving.

## 2017-06-07 NOTE — Assessment & Plan Note (Signed)
Wearing CPAP as directed. Is working on weight loss. BP well-controlled. Follow-up with Pulmonology as scheduled.

## 2017-06-07 NOTE — Assessment & Plan Note (Signed)
BP even in both arms. Nothing further needed.

## 2017-06-07 NOTE — Assessment & Plan Note (Signed)
BP normotensive. Asymptomatic. Mild bradycardia noted. Will decrease Amlodipine to 5 mg daily. Continue other medications as directed. Keep up with diet and exercise. Patient to check home BP and pulse daily over the next week and call me with readings so further adjustments can be made. CMP today.

## 2017-06-08 ENCOUNTER — Other Ambulatory Visit: Payer: Self-pay | Admitting: Emergency Medicine

## 2017-06-08 DIAGNOSIS — R972 Elevated prostate specific antigen [PSA]: Secondary | ICD-10-CM

## 2017-06-12 ENCOUNTER — Other Ambulatory Visit: Payer: Self-pay | Admitting: Emergency Medicine

## 2017-06-12 ENCOUNTER — Encounter: Payer: Self-pay | Admitting: Physician Assistant

## 2017-06-12 MED ORDER — FLUTICASONE PROPIONATE 50 MCG/ACT NA SUSP: 2.0000 | Freq: Every day | NASAL | 1 refills | Status: DC

## 2017-06-16 ENCOUNTER — Encounter: Payer: Self-pay | Admitting: Physician Assistant

## 2017-07-07 ENCOUNTER — Encounter: Payer: Self-pay | Admitting: Physician Assistant

## 2017-07-07 DIAGNOSIS — K649 Unspecified hemorrhoids: Secondary | ICD-10-CM

## 2017-07-10 DIAGNOSIS — R972 Elevated prostate specific antigen [PSA]: Secondary | ICD-10-CM | POA: Diagnosis not present

## 2017-08-01 ENCOUNTER — Encounter: Payer: Self-pay | Admitting: Physician Assistant

## 2017-08-01 DIAGNOSIS — K649 Unspecified hemorrhoids: Secondary | ICD-10-CM | POA: Diagnosis not present

## 2017-08-01 DIAGNOSIS — I10 Essential (primary) hypertension: Secondary | ICD-10-CM

## 2017-08-04 MED ORDER — AMLODIPINE BESYLATE 5 MG PO TABS: 5.0000 mg | ORAL_TABLET | Freq: Every day | ORAL | 1 refills | Status: DC

## 2017-08-04 NOTE — Addendum Note (Signed)
Addended by: Brunetta Jeans on: 08/04/2017 08:21 AM   Modules accepted: Orders

## 2017-09-18 ENCOUNTER — Encounter: Payer: Self-pay | Admitting: Physician Assistant

## 2017-09-18 DIAGNOSIS — I10 Essential (primary) hypertension: Secondary | ICD-10-CM

## 2017-09-18 MED ORDER — LISINOPRIL 10 MG PO TABS: 10.0000 mg | ORAL_TABLET | Freq: Every day | ORAL | 1 refills | Status: DC

## 2017-09-18 MED ORDER — TERAZOSIN HCL 1 MG PO CAPS: 1.0000 mg | ORAL_CAPSULE | Freq: Every day | ORAL | 1 refills | Status: DC

## 2017-09-18 MED ORDER — FLUTICASONE PROPIONATE 50 MCG/ACT NA SUSP: 2.0000 | Freq: Every day | NASAL | 1 refills | Status: DC

## 2017-09-18 MED ORDER — AMLODIPINE BESYLATE 5 MG PO TABS: 5.0000 mg | ORAL_TABLET | Freq: Every day | ORAL | 1 refills | Status: DC

## 2017-09-26 ENCOUNTER — Encounter: Payer: Self-pay | Admitting: Adult Health

## 2017-09-26 ENCOUNTER — Other Ambulatory Visit: Payer: Self-pay | Admitting: Physician Assistant

## 2017-09-26 ENCOUNTER — Ambulatory Visit (INDEPENDENT_AMBULATORY_CARE_PROVIDER_SITE_OTHER): Payer: BLUE CROSS/BLUE SHIELD | Admitting: Adult Health

## 2017-09-26 DIAGNOSIS — G4733 Obstructive sleep apnea (adult) (pediatric): Secondary | ICD-10-CM

## 2017-09-26 DIAGNOSIS — I10 Essential (primary) hypertension: Secondary | ICD-10-CM

## 2017-09-26 NOTE — Assessment & Plan Note (Signed)
Wt loss  

## 2017-09-26 NOTE — Assessment & Plan Note (Signed)
Well-controlled on CPAP  Plan  Patient Instructions  Keep up the good work Continue on CPAP at bedtime Work on healthy weight Do not drive a sleepy Follow-up in 1 year with Dr. Elsworth Soho and as needed

## 2017-09-26 NOTE — Addendum Note (Signed)
Addended by: Parke Poisson E on: 09/26/2017 11:58 AM   Modules accepted: Orders

## 2017-09-26 NOTE — Progress Notes (Signed)
@Patient  ID: Steven Sloan, male    DOB: 1960/11/25, 57 y.o.   MRN: [id number]  Chief Complaint  Patient presents with  . Follow-up    OSA    Referring provider: Delorse Limber  HPI: 57 year old male followed for obstructive sleep apnea  Test  ~2007 PSG AHI 40-60/hr   09/26/2017 Follow up : OSA  Patient presents for a follow-up for sleep apnea.  Patient is on CPAP at bedtime.  Patient says he feels rested with no significant daytime sleepiness.  Download shows excellent compliance with average usage at 8 hours.  Patient is on auto CPAP 10 to 14 cm H2O.  AHI 1.4.  Minimal leaks. No mask or machine issues .    No Known Allergies  Immunization History  Administered Date(s) Administered  . Influenza,inj,Quad PF,6+ Mos 06/10/2015, 01/22/2016, 01/30/2017  . Influenza-Unspecified 01/30/2014  . Td 05/02/2006  . Tdap 05/31/2016    Past Medical History:  Diagnosis Date  . Allergy    Seasonal  . Chicken pox   . Heart murmur    Asymptomatic -- Patient endorses previous negative evaluation  . Hypertension   . Sleep apnea    CPAP nightly    Tobacco History: Social History   Tobacco Use  Smoking Status Former Smoker  . Packs/day: 0.25  . Years: 25.00  . Pack years: 6.25  . Types: Cigarettes  . Last attempt to quit: 05/17/2016  . Years since quitting: 1.3  Smokeless Tobacco Never Used   Counseling given: Not Answered   Outpatient Encounter Medications as of 09/26/2017  Medication Sig  . amLODipine (NORVASC) 5 MG tablet Take 1 tablet (5 mg total) by mouth daily.  Marland Kitchen aspirin EC 81 MG tablet Take 81 mg by mouth daily.  Marland Kitchen azelastine (ASTELIN) 0.1 % nasal spray Place 1 spray into both nostrils 2 (two) times daily. Use in each nostril as directed  . fluticasone (FLONASE) 50 MCG/ACT nasal spray Place 2 sprays into both nostrils daily.  . hydrocortisone (ANUSOL-HC) 25 MG suppository Place 1 suppository (25 mg total) rectally 2 (two) times daily.  Marland Kitchen ibuprofen  (ADVIL,MOTRIN) 600 MG tablet Take 1 tablet by mouth as needed.  Marland Kitchen lisinopril (PRINIVIL,ZESTRIL) 10 MG tablet Take 1 tablet (10 mg total) by mouth daily.  . Multiple Vitamins-Minerals (CENTRUM ADULTS) TABS Take 1 tablet by mouth daily.  . Omega-3 Fatty Acids (FISH OIL) 1200 MG CAPS Take 2 capsules by mouth daily.   . psyllium (METAMUCIL) 0.52 g capsule Take 0.52 g by mouth daily.  . tadalafil (CIALIS) 10 MG tablet Take 1 tablet (10 mg total) by mouth daily as needed for erectile dysfunction.  Marland Kitchen terazosin (HYTRIN) 1 MG capsule Take 1 capsule (1 mg total) by mouth at bedtime.   No facility-administered encounter medications on file as of 09/26/2017.      Review of Systems  Constitutional:   No  weight loss, night sweats,  Fevers, chills, fatigue, or  lassitude.  HEENT:   No headaches,  Difficulty swallowing,  Tooth/dental problems, or  Sore throat,                No sneezing, itching, ear ache, nasal congestion, post nasal drip,   CV:  No chest pain,  Orthopnea, PND, swelling in lower extremities, anasarca, dizziness, palpitations, syncope.   GI  No heartburn, indigestion, abdominal pain, nausea, vomiting, diarrhea, change in bowel habits, loss of appetite, bloody stools.   Resp: No shortness of breath with exertion or at rest.  No excess mucus, no productive cough,  No non-productive cough,  No coughing up of blood.  No change in color of mucus.  No wheezing.  No chest wall deformity  Skin: no rash or lesions.  GU: no dysuria, change in color of urine, no urgency or frequency.  No flank pain, no hematuria   MS:  No joint pain or swelling.  No decreased range of motion.  No back pain.    Physical Exam  BP 118/72 (BP Location: Left Arm, Cuff Size: Normal)   Pulse (!) 56   Ht 5\' 10"  (1.778 m)   Wt 249 lb (112.9 kg)   SpO2 95%   BMI 35.73 kg/m   GEN: A/Ox3; pleasant , NAD, well nourished    HEENT:  [place]/AT,  EACs-clear, TMs-wnl, NOSE-clear, THROAT-clear, no lesions, no postnasal  drip or exudate noted. Class 2-3 MP airway   NECK:  Supple w/ fair ROM; no JVD; normal carotid impulses w/o bruits; no thyromegaly or nodules palpated; no lymphadenopathy.    RESP  Clear  P & A; w/o, wheezes/ rales/ or rhonchi. no accessory muscle use, no dullness to percussion  CARD:  RRR, no m/r/g, no peripheral edema, pulses intact, no cyanosis or clubbing.  GI:   Soft & nt; nml bowel sounds; no organomegaly or masses detected.   Musco: Warm bil, no deformities or joint swelling noted.   Neuro: alert, no focal deficits noted.    Skin: Warm, no lesions or rashes    Lab Results:  CBC  BMET  BNP No results found for: BNP  ProBNP No results found for: PROBNP  Imaging: No results found.   Assessment & Plan:   OSA (obstructive sleep apnea) Well-controlled on CPAP  Plan  Patient Instructions  Keep up the good work Continue on CPAP at bedtime Work on healthy weight Do not drive a sleepy Follow-up in 1 year with Dr. Elsworth Soho and as needed     Obesity Wt loss      Rexene Edison, NP 09/26/2017

## 2017-09-26 NOTE — Patient Instructions (Signed)
Keep up the good work Continue on CPAP at bedtime Work on healthy weight Do not drive a sleepy Follow-up in 1 year with Dr. Elsworth Soho and as needed

## 2017-10-02 DIAGNOSIS — R972 Elevated prostate specific antigen [PSA]: Secondary | ICD-10-CM | POA: Diagnosis not present

## 2017-11-22 DIAGNOSIS — R972 Elevated prostate specific antigen [PSA]: Secondary | ICD-10-CM | POA: Diagnosis not present

## 2017-12-01 DIAGNOSIS — G4733 Obstructive sleep apnea (adult) (pediatric): Secondary | ICD-10-CM | POA: Diagnosis not present

## 2017-12-28 ENCOUNTER — Encounter: Payer: Self-pay | Admitting: Physician Assistant

## 2017-12-28 ENCOUNTER — Other Ambulatory Visit: Payer: Self-pay

## 2017-12-28 DIAGNOSIS — I10 Essential (primary) hypertension: Secondary | ICD-10-CM

## 2017-12-28 MED ORDER — TERAZOSIN HCL 1 MG PO CAPS: 1.0000 mg | ORAL_CAPSULE | Freq: Every day | ORAL | 1 refills | Status: DC

## 2017-12-28 MED ORDER — AMLODIPINE BESYLATE 5 MG PO TABS: 5.0000 mg | ORAL_TABLET | Freq: Every day | ORAL | 1 refills | Status: DC

## 2017-12-28 MED ORDER — FLUTICASONE PROPIONATE 50 MCG/ACT NA SUSP: 2.0000 | Freq: Every day | NASAL | 1 refills | Status: DC

## 2017-12-28 MED ORDER — LISINOPRIL 10 MG PO TABS: 10.0000 mg | ORAL_TABLET | Freq: Every day | ORAL | 1 refills | Status: DC

## 2018-02-16 ENCOUNTER — Ambulatory Visit (INDEPENDENT_AMBULATORY_CARE_PROVIDER_SITE_OTHER): Payer: BLUE CROSS/BLUE SHIELD | Admitting: *Deleted

## 2018-02-16 DIAGNOSIS — Z23 Encounter for immunization: Secondary | ICD-10-CM

## 2018-03-15 DIAGNOSIS — R972 Elevated prostate specific antigen [PSA]: Secondary | ICD-10-CM | POA: Diagnosis not present

## 2018-03-26 DIAGNOSIS — N400 Enlarged prostate without lower urinary tract symptoms: Secondary | ICD-10-CM | POA: Diagnosis not present

## 2018-05-29 ENCOUNTER — Other Ambulatory Visit: Payer: Self-pay | Admitting: Physician Assistant

## 2018-05-29 DIAGNOSIS — I10 Essential (primary) hypertension: Secondary | ICD-10-CM

## 2018-08-09 ENCOUNTER — Encounter: Payer: Self-pay | Admitting: Physician Assistant

## 2018-08-09 DIAGNOSIS — I10 Essential (primary) hypertension: Secondary | ICD-10-CM

## 2018-08-09 NOTE — Telephone Encounter (Signed)
Please advise if you want patient to have a visit.

## 2018-08-09 NOTE — Telephone Encounter (Signed)
Patient needs video visit before further refills will be given. See how many tablets of medication he has left - a 30-day supply can be sent to local pharmacy if needed.

## 2018-08-09 NOTE — Telephone Encounter (Signed)
Patient scheduled for visit on Monday for refills.

## 2018-08-13 ENCOUNTER — Other Ambulatory Visit: Payer: Self-pay

## 2018-08-13 ENCOUNTER — Encounter: Payer: Self-pay | Admitting: Physician Assistant

## 2018-08-13 ENCOUNTER — Ambulatory Visit (INDEPENDENT_AMBULATORY_CARE_PROVIDER_SITE_OTHER): Payer: BLUE CROSS/BLUE SHIELD | Admitting: Physician Assistant

## 2018-08-13 VITALS — BP 135/68 | HR 63 | Temp 98.9°F | Ht 71.0 in | Wt 278.0 lb

## 2018-08-13 DIAGNOSIS — Z6837 Body mass index (BMI) 37.0-37.9, adult: Secondary | ICD-10-CM

## 2018-08-13 DIAGNOSIS — G4733 Obstructive sleep apnea (adult) (pediatric): Secondary | ICD-10-CM | POA: Diagnosis not present

## 2018-08-13 DIAGNOSIS — E66812 Obesity, class 2: Secondary | ICD-10-CM | POA: Insufficient documentation

## 2018-08-13 DIAGNOSIS — I10 Essential (primary) hypertension: Secondary | ICD-10-CM

## 2018-08-13 MED ORDER — TERAZOSIN HCL 1 MG PO CAPS: 1.0000 mg | ORAL_CAPSULE | Freq: Every day | ORAL | 1 refills | Status: DC

## 2018-08-13 MED ORDER — LISINOPRIL 10 MG PO TABS: 10.0000 mg | ORAL_TABLET | Freq: Every day | ORAL | 1 refills | Status: DC

## 2018-08-13 MED ORDER — FLUTICASONE PROPIONATE 50 MCG/ACT NA SUSP: 2.0000 | Freq: Every day | NASAL | 1 refills | Status: DC

## 2018-08-13 MED ORDER — AMLODIPINE BESYLATE 5 MG PO TABS: 5.0000 mg | ORAL_TABLET | Freq: Every day | ORAL | 1 refills | Status: DC

## 2018-08-13 NOTE — Progress Notes (Signed)
Virtual Visit via Video Note I connected with Steven Sloan on 08/13/18 at 11:00 AM EDT by a video enabled telemedicine application and verified that I am speaking with the correct person using two identifiers.   I discussed the limitations of evaluation and management by telemedicine and the availability of in person appointments. The patient expressed understanding and agreed to proceed.  History of Present Illness: Patient presents today via [email or web address] for Hypertension follow-up. Patient is also due for monitoring labs. Patient currently on a regimen of amlodipine 5 mg QD, Lisinopril 10 mg QD. Endorses taking medications as directed. Is tolerating well. Patient denies chest pain, palpitations, lightheadedness, dizziness, vision changes or frequent headaches. Has been checking BP at home and has sent a log to MyChart. Will have added to EMR.   BP Readings from Last 3 Encounters:  08/13/18 135/68  09/26/17 118/72  06/07/17 126/70   Notes he and his wife have been working from home over the past month due to [place]. Has been staying happy and healthy but has been eating more than normal. IS on the treadmill 4-5 days a week for 30-45 minutes per day.    Observations/Objective: Patient is well-developed, well-nourished in no acute distress.  Resting comfortably in desk chair at home.  Head is normocephalic, atraumatic.  No labored breathing.  Speech is clear and coherent with logical contest.  Patient is alert and oriented at baseline.   Assessment and Plan: Essential hypertension, benign BP stable. Asymptomatic. Will check BMP. Lab appointment scheduled (tent) for later this week. Continue current regimen. Medications refilled.   OSA (obstructive sleep apnea) Wearing CPAP at night. BP stable. Continue CPAP as directed by Dr. Corrie Dandy.   Class 2 severe obesity due to excess calories with serious comorbidity and body mass index (BMI) of 37.0 to 37.9 in adult [facility]) Treadmill for 45 minutes 4-5  x per week. Snacking is a big issue for him. Discussed setting a time to cut off all eating. Also pre-portion snacks to help avoid overeating. Will monitor. CPE in Summer.   Follow Up Instructions: Lab scheduled for this Thursday.  CPE in Summer. He will callback to schedule.    I discussed the assessment and treatment plan with the patient. The patient was provided an opportunity to ask questions and all were answered. The patient agreed with the plan and demonstrated an understanding of the instructions.   The patient was advised to call back or seek an in-person evaluation if the symptoms worsen or if the condition fails to improve as anticipated.   Leeanne Rio, PA-C

## 2018-08-13 NOTE — Progress Notes (Signed)
I have discussed the procedure for the virtual visit with the patient who has given consent to proceed with assessment and treatment.   Rupinder Livingston S Dang Mathison, CMA     

## 2018-08-13 NOTE — Assessment & Plan Note (Signed)
Wearing CPAP at night. BP stable. Continue CPAP as directed by Dr. Corrie Dandy.

## 2018-08-13 NOTE — Assessment & Plan Note (Signed)
BP stable. Asymptomatic. Will check BMP. Lab appointment scheduled (tent) for later this week. Continue current regimen. Medications refilled.

## 2018-08-13 NOTE — Assessment & Plan Note (Signed)
Treadmill for 45 minutes 4-5 x per week. Snacking is a big issue for him. Discussed setting a time to cut off all eating. Also pre-portion snacks to help avoid overeating. Will monitor. CPE in Summer.

## 2018-08-16 ENCOUNTER — Other Ambulatory Visit (INDEPENDENT_AMBULATORY_CARE_PROVIDER_SITE_OTHER): Payer: BLUE CROSS/BLUE SHIELD

## 2018-08-16 DIAGNOSIS — I10 Essential (primary) hypertension: Secondary | ICD-10-CM | POA: Diagnosis not present

## 2018-08-16 LAB — COMPREHENSIVE METABOLIC PANEL
ALT: 31 U/L (ref 0–53)
AST: 18 U/L (ref 0–37)
Albumin: 4.8 g/dL (ref 3.5–5.2)
Alkaline Phosphatase: 52 U/L (ref 39–117)
BUN: 22 mg/dL (ref 6–23)
CO2: 28 mEq/L (ref 19–32)
Calcium: 9.8 mg/dL (ref 8.4–10.5)
Chloride: 102 mEq/L (ref 96–112)
Creatinine, Ser: 1.43 mg/dL (ref 0.40–1.50)
GFR: 51.26 mL/min — ABNORMAL LOW (ref >60.00)
Glucose, Bld: 113 mg/dL — ABNORMAL HIGH (ref 70–99)
Potassium: 3.9 mEq/L (ref 3.5–5.1)
Sodium: 139 mEq/L (ref 135–145)
Total Bilirubin: 1.7 mg/dL — ABNORMAL HIGH (ref 0.2–1.2)
Total Protein: 7.8 g/dL (ref 6.0–8.3)

## 2018-09-03 ENCOUNTER — Encounter: Payer: Self-pay | Admitting: Physician Assistant

## 2018-09-03 MED ORDER — AZELASTINE HCL 0.15 % NA SOLN: 1.0000 | Freq: Two times a day (BID) | NASAL | 0 refills | Status: DC

## 2018-09-25 ENCOUNTER — Other Ambulatory Visit: Payer: Self-pay | Admitting: Physician Assistant

## 2018-09-26 ENCOUNTER — Encounter: Payer: Self-pay | Admitting: Family

## 2018-09-27 ENCOUNTER — Encounter: Payer: Self-pay | Admitting: Adult Health

## 2018-09-27 ENCOUNTER — Ambulatory Visit (INDEPENDENT_AMBULATORY_CARE_PROVIDER_SITE_OTHER): Payer: BLUE CROSS/BLUE SHIELD | Admitting: Adult Health

## 2018-09-27 ENCOUNTER — Other Ambulatory Visit: Payer: Self-pay

## 2018-09-27 VITALS — BP 124/80 | HR 93 | Temp 97.7°F | Resp 16 | Ht 70.0 in | Wt 276.6 lb

## 2018-09-27 DIAGNOSIS — Z6837 Body mass index (BMI) 37.0-37.9, adult: Secondary | ICD-10-CM

## 2018-09-27 DIAGNOSIS — G4733 Obstructive sleep apnea (adult) (pediatric): Secondary | ICD-10-CM

## 2018-09-27 NOTE — Assessment & Plan Note (Signed)
Wt loss  

## 2018-09-27 NOTE — Progress Notes (Signed)
@Patient  ID: Steven Sloan, male    DOB: 1960/07/21, 58 y.o.   MRN: [id number]  Chief Complaint  Patient presents with  . Follow-up    OSA     Referring provider: Delorse Limber  HPI: 58 year old male followed for obstructive sleep apnea   TEST/EVENTS :  ~2007 PSG AHI 40-60/hr   09/27/2018 Follow up : OSA  Patient returns for 1 year follow up for sleep apnea. Says he is doing well on CPAP . Never misses a night . Feels rested and benefits from CPAP . Does need a new CPAP machine , says it a signal has been saying new motor needed.  Download shows excellent compliance and control . Says he is remaining active.   No Known Allergies  Immunization History  Administered Date(s) Administered  . Influenza,inj,Quad PF,6+ Mos 06/10/2015, 01/22/2016, 01/30/2017, 02/16/2018  . Influenza-Unspecified 01/30/2014  . Td 05/02/2006  . Tdap 05/31/2016    Past Medical History:  Diagnosis Date  . Allergy    Seasonal  . Chicken pox   . Heart murmur    Asymptomatic -- Patient endorses previous negative evaluation  . Hypertension   . Sleep apnea    CPAP nightly    Tobacco History: Social History   Tobacco Use  Smoking Status Former Smoker  . Packs/day: 0.25  . Years: 25.00  . Pack years: 6.25  . Types: Cigarettes  . Last attempt to quit: 05/17/2016  . Years since quitting: 2.3  Smokeless Tobacco Never Used   Counseling given: Not Answered   Outpatient Medications Prior to Visit  Medication Sig Dispense Refill  . amLODipine (NORVASC) 5 MG tablet Take 1 tablet (5 mg total) by mouth daily. 90 tablet 1  . aspirin EC 81 MG tablet Take 81 mg by mouth daily.    . Azelastine HCl 0.15 % SOLN PLACE 1-2 SPRAYS INTO THE NOSE 2 (TWO) TIMES A DAY. 30 mL 3  . fluticasone (FLONASE) 50 MCG/ACT nasal spray Place 2 sprays into both nostrils daily. 48 g 1  . ibuprofen (ADVIL,MOTRIN) 600 MG tablet Take 1 tablet by mouth as needed.  0  . lisinopril (PRINIVIL,ZESTRIL) 10 MG tablet  Take 1 tablet (10 mg total) by mouth daily. 90 tablet 1  . Multiple Vitamins-Minerals (CENTRUM ADULTS) TABS Take 1 tablet by mouth daily.    . Omega-3 Fatty Acids (FISH OIL) 1200 MG CAPS Take 2 capsules by mouth daily.     . psyllium (METAMUCIL) 0.52 g capsule Take 0.52 g by mouth daily.    . tadalafil (CIALIS) 10 MG tablet Take 1 tablet (10 mg total) by mouth daily as needed for erectile dysfunction. 6 tablet 6  . terazosin (HYTRIN) 1 MG capsule Take 1 capsule (1 mg total) by mouth at bedtime. 90 capsule 1   No facility-administered medications prior to visit.      Review of Systems:   Constitutional:   No  weight loss, night sweats,  Fevers, chills, fatigue, or  lassitude.  HEENT:   No headaches,  Difficulty swallowing,  Tooth/dental problems, or  Sore throat,                No sneezing, itching, ear ache, nasal congestion, post nasal drip,   CV:  No chest pain,  Orthopnea, PND, swelling in lower extremities, anasarca, dizziness, palpitations, syncope.   GI  No heartburn, indigestion, abdominal pain, nausea, vomiting, diarrhea, change in bowel habits, loss of appetite, bloody stools.   Resp: No shortness of breath  with exertion or at rest.  No excess mucus, no productive cough,  No non-productive cough,  No coughing up of blood.  No change in color of mucus.  No wheezing.  No chest wall deformity  Skin: no rash or lesions.  GU: no dysuria, change in color of urine, no urgency or frequency.  No flank pain, no hematuria   MS:  No joint pain or swelling.  No decreased range of motion.  No back pain.    Physical Exam  BP 124/80 (BP Location: Left Arm, Cuff Size: Large)   Pulse 93   Temp 97.7 F (36.5 C) (Oral)   Resp 16   Ht 5\' 10"  (1.778 m)   Wt 276 lb 9.6 oz (125.5 kg)   SpO2 97%   BMI 39.69 kg/m   GEN: A/Ox3; pleasant , NAD, obese    HEENT:  [place]/AT,  EACs-clear, TMs-wnl, NOSE-clear, THROAT-clear, no lesions, no postnasal drip or exudate noted.   NECK:  Supple w/ fair  ROM; no JVD; normal carotid impulses w/o bruits; no thyromegaly or nodules palpated; no lymphadenopathy.    RESP  Clear  P & A; w/o, wheezes/ rales/ or rhonchi. no accessory muscle use, no dullness to percussion  CARD:  RRR, no m/r/g, no peripheral edema, pulses intact, no cyanosis or clubbing.  GI:   Soft & nt; nml bowel sounds; no organomegaly or masses detected.   Musco: Warm bil, no deformities or joint swelling noted.   Neuro: alert, no focal deficits noted.    Skin: Warm, no lesions or rashes    Lab Results:  CBC  BMET  BNP No results found for: BNP  ProBNP No results found for: PROBNP  Imaging: No results found.    No flowsheet data found.  No results found for: NITRICOXIDE      Assessment & Plan:   OSA (obstructive sleep apnea) Excellent control  Needs new machine   Plan  Patient Instructions  Keep up the good work Continue on CPAP at bedtime Work on healthy weight Do not drive a sleepy Order for new CPAP machine  Follow-up in 1 year with Dr. Elsworth Soho and as needed    Class 2 severe obesity due to excess calories with serious comorbidity and body mass index (BMI) of 37.0 to 37.9 in adult [facility]) Wt loss      Rexene Edison, NP 09/27/2018

## 2018-09-27 NOTE — Assessment & Plan Note (Signed)
Excellent control  Needs new machine   Plan  Patient Instructions  Keep up the good work Continue on CPAP at bedtime Work on healthy weight Do not drive a sleepy Order for new CPAP machine  Follow-up in 1 year with Dr. Elsworth Soho and as needed

## 2018-09-27 NOTE — Patient Instructions (Addendum)
Keep up the good work Continue on CPAP at bedtime Work on healthy weight Do not drive a sleepy Order for new CPAP machine  Follow-up in 1 year with Dr. Elsworth Soho and as needed

## 2018-10-10 ENCOUNTER — Telehealth: Payer: Self-pay | Admitting: Adult Health

## 2018-10-10 NOTE — Telephone Encounter (Signed)
[place] to check status of order - it was faxed on 5/28 & confirmation was received.  I was routed to [employer].  Left her a message asking for status of order.  I called pt & told him I have left them a vm & will let him know when I hear back.

## 2018-10-10 NOTE — Telephone Encounter (Signed)
Spoke to [place] at [employer].  She found the order & states it was not processed correctly.  She is going to send it over to her team & have them reach out to the pt & call him within the next 30 minutes.  Nothing further needed.

## 2018-10-18 DIAGNOSIS — G4733 Obstructive sleep apnea (adult) (pediatric): Secondary | ICD-10-CM | POA: Diagnosis not present

## 2018-11-13 DIAGNOSIS — N400 Enlarged prostate without lower urinary tract symptoms: Secondary | ICD-10-CM | POA: Diagnosis not present

## 2018-11-17 DIAGNOSIS — G4733 Obstructive sleep apnea (adult) (pediatric): Secondary | ICD-10-CM | POA: Diagnosis not present

## 2018-11-21 DIAGNOSIS — N400 Enlarged prostate without lower urinary tract symptoms: Secondary | ICD-10-CM | POA: Diagnosis not present

## 2018-11-29 ENCOUNTER — Ambulatory Visit: Payer: BLUE CROSS/BLUE SHIELD | Admitting: Adult Health

## 2018-12-03 ENCOUNTER — Encounter: Payer: Self-pay | Admitting: Adult Health

## 2018-12-03 ENCOUNTER — Ambulatory Visit (INDEPENDENT_AMBULATORY_CARE_PROVIDER_SITE_OTHER): Payer: BC Managed Care – PPO | Admitting: Adult Health

## 2018-12-03 ENCOUNTER — Other Ambulatory Visit: Payer: Self-pay

## 2018-12-03 VITALS — BP 118/64 | HR 62 | Temp 98.2°F | Ht 70.0 in | Wt 264.6 lb

## 2018-12-03 DIAGNOSIS — Z6837 Body mass index (BMI) 37.0-37.9, adult: Secondary | ICD-10-CM | POA: Diagnosis not present

## 2018-12-03 DIAGNOSIS — G4733 Obstructive sleep apnea (adult) (pediatric): Secondary | ICD-10-CM | POA: Diagnosis not present

## 2018-12-03 NOTE — Patient Instructions (Signed)
Keep up the good work Continue on CPAP at bedtime Work on healthy weight Do not drive a sleepy Follow-up in 1 year with Dr. Elsworth Soho and as needed

## 2018-12-03 NOTE — Assessment & Plan Note (Signed)
Wt loss  

## 2018-12-03 NOTE — Assessment & Plan Note (Signed)
Severe sleep apnea with excellent control and compliance on nocturnal CPAP  Plan  Patient Instructions  Keep up the good work Continue on CPAP at bedtime Work on healthy weight Do not drive a sleepy Follow-up in 1 year with Dr. Elsworth Soho and as needed

## 2018-12-03 NOTE — Progress Notes (Signed)
@Patient  ID: Steven Sloan, male    DOB: 20-Mar-1961, 58 y.o.   MRN: [id number]  Chief Complaint  Patient presents with  . Follow-up    OSA     Referring provider: Delorse Limber  HPI: 58 year old male followed for obstructive sleep apnea   TEST/EVENTS :  ~2007 PSG AHI 40-60/hr  12/03/2018 Follow up: OSA  Patient returns for a 70-month follow-up.  Patient has underlying severe sleep apnea.  Patient is on nocturnal CPAP.  Patient recently received a new CPAP machine.  Patient says he is doing well with his machine.  He feels rested with no significant daytime sleepiness.  Feels that he benefits from CPAP.  CPAP download shows excellent compliance over the last 30 days with 100% usage.  Daily average usage at 8 hours.  Patient is on CPAP AutoSet 10 to 14 cm H2O.  AHI 5.4.  Minimal leaks.  Has recently got a new puppy.  No Known Allergies  Immunization History  Administered Date(s) Administered  . Influenza,inj,Quad PF,6+ Mos 06/10/2015, 01/22/2016, 01/30/2017, 02/16/2018  . Influenza-Unspecified 01/30/2014  . Td 05/02/2006  . Tdap 05/31/2016    Past Medical History:  Diagnosis Date  . Allergy    Seasonal  . Chicken pox   . Heart murmur    Asymptomatic -- Patient endorses previous negative evaluation  . Hypertension   . Sleep apnea    CPAP nightly    Tobacco History: Social History   Tobacco Use  Smoking Status Former Smoker  . Packs/day: 0.25  . Years: 25.00  . Pack years: 6.25  . Types: Cigarettes  . Quit date: 05/18/2015  . Years since quitting: 3.5  Smokeless Tobacco Never Used   Counseling given: Not Answered   Outpatient Medications Prior to Visit  Medication Sig Dispense Refill  . amLODipine (NORVASC) 5 MG tablet Take 1 tablet (5 mg total) by mouth daily. 90 tablet 1  . aspirin EC 81 MG tablet Take 81 mg by mouth daily.    . Azelastine HCl 0.15 % SOLN PLACE 1-2 SPRAYS INTO THE NOSE 2 (TWO) TIMES A DAY. 30 mL 3  . fluticasone (FLONASE) 50  MCG/ACT nasal spray Place 2 sprays into both nostrils daily. 48 g 1  . ibuprofen (ADVIL,MOTRIN) 600 MG tablet Take 1 tablet by mouth as needed.  0  . lisinopril (PRINIVIL,ZESTRIL) 10 MG tablet Take 1 tablet (10 mg total) by mouth daily. 90 tablet 1  . Multiple Vitamins-Minerals (CENTRUM ADULTS) TABS Take 1 tablet by mouth daily.    . Omega-3 Fatty Acids (FISH OIL) 1200 MG CAPS Take 2 capsules by mouth daily.     . psyllium (METAMUCIL) 0.52 g capsule Take 0.52 g by mouth daily.    . tadalafil (CIALIS) 10 MG tablet Take 1 tablet (10 mg total) by mouth daily as needed for erectile dysfunction. 6 tablet 6  . terazosin (HYTRIN) 1 MG capsule Take 1 capsule (1 mg total) by mouth at bedtime. 90 capsule 1   No facility-administered medications prior to visit.      Review of Systems:   Constitutional:   No  weight loss, night sweats,  Fevers, chills, fatigue, or  lassitude.  HEENT:   No headaches,  Difficulty swallowing,  Tooth/dental problems, or  Sore throat,                No sneezing, itching, ear ache, nasal congestion, post nasal drip,   CV:  No chest pain,  Orthopnea, PND, swelling in lower  extremities, anasarca, dizziness, palpitations, syncope.   GI  No heartburn, indigestion, abdominal pain, nausea, vomiting, diarrhea, change in bowel habits, loss of appetite, bloody stools.   Resp: No shortness of breath with exertion or at rest.  No excess mucus, no productive cough,  No non-productive cough,  No coughing up of blood.  No change in color of mucus.  No wheezing.  No chest wall deformity  Skin: no rash or lesions.  GU: no dysuria, change in color of urine, no urgency or frequency.  No flank pain, no hematuria   MS:  No joint pain or swelling.  No decreased range of motion.  No back pain.    Physical Exam  BP 118/64 (BP Location: Left Arm, Patient Position: Sitting, Cuff Size: Normal)   Pulse 62   Temp 98.2 F (36.8 C) (Oral)   Ht 5\' 10"  (1.778 m)   Wt 264 lb 9.6 oz (120 kg)    SpO2 97%   BMI 37.97 kg/m   GEN: A/Ox3; pleasant , NAD, obese   HEENT:  [place]/AT,  , NOSE-clear, THROAT-clear, no lesions, no postnasal drip or exudate noted.  Class II-III MP airway  NECK:  Supple w/ fair ROM; no JVD; normal carotid impulses w/o bruits; no thyromegaly or nodules palpated; no lymphadenopathy.    RESP  Clear  P & A; w/o, wheezes/ rales/ or rhonchi. no accessory muscle use, no dullness to percussion  CARD:  RRR, no m/r/g, no peripheral edema, pulses intact, no cyanosis or clubbing.  GI:   Soft & nt; nml bowel sounds; no organomegaly or masses detected.   Musco: Warm bil, no deformities or joint swelling noted.   Neuro: alert, no focal deficits noted.    Skin: Warm, no lesions or rashes    Lab Results:   BNP No results found for: BNP  ProBNP No results found for: PROBNP  Imaging: No results found.    No flowsheet data found.  No results found for: NITRICOXIDE      Assessment & Plan:   OSA (obstructive sleep apnea) Severe sleep apnea with excellent control and compliance on nocturnal CPAP  Plan  Patient Instructions  Keep up the good work Continue on CPAP at bedtime Work on healthy weight Do not drive a sleepy Follow-up in 1 year with Dr. Elsworth Soho and as needed     Class 2 severe obesity due to excess calories with serious comorbidity and body mass index (BMI) of 37.0 to 37.9 in adult [facility]) Wt loss      Rexene Edison, NP 12/03/2018

## 2018-12-18 DIAGNOSIS — G4733 Obstructive sleep apnea (adult) (pediatric): Secondary | ICD-10-CM | POA: Diagnosis not present

## 2018-12-24 ENCOUNTER — Encounter: Payer: Self-pay | Admitting: Physician Assistant

## 2018-12-26 ENCOUNTER — Ambulatory Visit (INDEPENDENT_AMBULATORY_CARE_PROVIDER_SITE_OTHER): Payer: BC Managed Care – PPO

## 2018-12-26 ENCOUNTER — Other Ambulatory Visit: Payer: Self-pay

## 2018-12-26 DIAGNOSIS — Z23 Encounter for immunization: Secondary | ICD-10-CM

## 2019-01-02 ENCOUNTER — Encounter: Payer: Self-pay | Admitting: Physician Assistant

## 2019-01-10 ENCOUNTER — Encounter: Payer: Self-pay | Admitting: Physician Assistant

## 2019-01-11 ENCOUNTER — Ambulatory Visit (INDEPENDENT_AMBULATORY_CARE_PROVIDER_SITE_OTHER): Payer: BC Managed Care – PPO | Admitting: Physician Assistant

## 2019-01-11 ENCOUNTER — Encounter: Payer: Self-pay | Admitting: Physician Assistant

## 2019-01-11 ENCOUNTER — Other Ambulatory Visit: Payer: Self-pay

## 2019-01-11 VITALS — BP 120/80 | HR 61 | Temp 98.4°F | Resp 16 | Ht 69.0 in | Wt 273.0 lb

## 2019-01-11 DIAGNOSIS — I10 Essential (primary) hypertension: Secondary | ICD-10-CM

## 2019-01-11 DIAGNOSIS — Z23 Encounter for immunization: Secondary | ICD-10-CM | POA: Diagnosis not present

## 2019-01-11 DIAGNOSIS — G4733 Obstructive sleep apnea (adult) (pediatric): Secondary | ICD-10-CM | POA: Diagnosis not present

## 2019-01-11 DIAGNOSIS — D229 Melanocytic nevi, unspecified: Secondary | ICD-10-CM | POA: Diagnosis not present

## 2019-01-11 DIAGNOSIS — Z0000 Encounter for general adult medical examination without abnormal findings: Secondary | ICD-10-CM | POA: Diagnosis not present

## 2019-01-11 DIAGNOSIS — R011 Cardiac murmur, unspecified: Secondary | ICD-10-CM | POA: Insufficient documentation

## 2019-01-11 LAB — COMPREHENSIVE METABOLIC PANEL
ALT: 28 U/L (ref 0–53)
AST: 19 U/L (ref 0–37)
Albumin: 4.4 g/dL (ref 3.5–5.2)
Alkaline Phosphatase: 56 U/L (ref 39–117)
BUN: 22 mg/dL (ref 6–23)
CO2: 27 mEq/L (ref 19–32)
Calcium: 9.8 mg/dL (ref 8.4–10.5)
Chloride: 106 mEq/L (ref 96–112)
Creatinine, Ser: 1.32 mg/dL (ref 0.40–1.50)
GFR: 56.14 mL/min — ABNORMAL LOW (ref >60.00)
Glucose, Bld: 117 mg/dL — ABNORMAL HIGH (ref 70–99)
Potassium: 4.4 mEq/L (ref 3.5–5.1)
Sodium: 141 mEq/L (ref 135–145)
Total Bilirubin: 1.2 mg/dL (ref 0.2–1.2)
Total Protein: 7.2 g/dL (ref 6.0–8.3)

## 2019-01-11 LAB — CBC WITH DIFFERENTIAL/PLATELET
Basophils Absolute: 0 10*3/uL (ref 0.0–0.1)
Basophils Relative: 0.6 % (ref 0.0–3.0)
Eosinophils Absolute: 0.4 10*3/uL (ref 0.0–0.7)
Eosinophils Relative: 5.2 % — ABNORMAL HIGH (ref 0.0–5.0)
HCT: 45.3 % (ref 39.0–52.0)
Hemoglobin: 15.5 g/dL (ref 13.0–17.0)
Lymphocytes Relative: 20.9 % (ref 12.0–46.0)
Lymphs Abs: 1.5 10*3/uL (ref 0.7–4.0)
MCHC: 34.2 g/dL (ref 30.0–36.0)
MCV: 90.7 fl (ref 78.0–100.0)
Monocytes Absolute: 0.7 10*3/uL (ref 0.1–1.0)
Monocytes Relative: 10 % (ref 3.0–12.0)
Neutro Abs: 4.6 10*3/uL (ref 1.4–7.7)
Neutrophils Relative %: 63.3 % (ref 43.0–77.0)
Platelets: 208 10*3/uL (ref 150.0–400.0)
RBC: 4.99 Mil/uL (ref 4.22–5.81)
RDW: 12.9 % (ref 11.5–15.5)
WBC: 7.2 10*3/uL (ref 4.0–10.5)

## 2019-01-11 LAB — LIPID PANEL
Cholesterol: 182 mg/dL (ref 0–200)
HDL: 45.1 mg/dL (ref >39.00)
LDL Cholesterol: 116 mg/dL — ABNORMAL HIGH (ref 0–99)
NonHDL: 136.46
Total CHOL/HDL Ratio: 4
Triglycerides: 104 mg/dL (ref 0.0–149.0)
VLDL: 20.8 mg/dL (ref 0.0–40.0)

## 2019-01-11 LAB — HEMOGLOBIN A1C: Hgb A1c MFr Bld: 5.3 % (ref 4.6–6.5)

## 2019-01-11 MED ORDER — TERAZOSIN HCL 1 MG PO CAPS: 1.0000 mg | ORAL_CAPSULE | Freq: Every day | ORAL | 1 refills | Status: DC

## 2019-01-11 MED ORDER — AMLODIPINE BESYLATE 5 MG PO TABS: 5.0000 mg | ORAL_TABLET | Freq: Every day | ORAL | 1 refills | Status: DC

## 2019-01-11 MED ORDER — FLUTICASONE PROPIONATE 50 MCG/ACT NA SUSP: 2.0000 | Freq: Every day | NASAL | 1 refills | Status: DC

## 2019-01-11 MED ORDER — LISINOPRIL 10 MG PO TABS: 10.0000 mg | ORAL_TABLET | Freq: Every day | ORAL | 1 refills | Status: DC

## 2019-01-11 NOTE — Addendum Note (Signed)
Addended by: Leonidas Romberg on: 01/11/2019 09:58 AM   Modules accepted: Orders

## 2019-01-11 NOTE — Progress Notes (Signed)
Patient presents to clinic today for annual exam.  Patient is fasting for labs.  Acute Concerns: Denies acute concerns today.  Chronic Issues: Hypertension, OSA --Patient currently on a regimen of Amlodipine 5 mg, Lisinopril 10 mg and Terazosin 1 mg. Endorses taking daily as directed. Patient denies chest pain, palpitations, lightheadedness, dizziness, vision changes or frequent headaches. Is compliant with CPAP machine. Has app on his phone that shows compliance and scoring.   BP Readings from Last 3 Encounters:  01/11/19 120/80  12/03/18 118/64  09/27/18 124/80   Health Maintenance: Immunizations -- Wants Shingrix - has verified insurance coverage for this.  Colonoscopy -- UTD  Past Medical History:  Diagnosis Date  . Allergy    Seasonal  . Chicken pox   . Heart murmur    Asymptomatic -- Patient endorses previous negative evaluation  . Hypertension   . Sleep apnea    CPAP nightly    Past Surgical History:  Procedure Laterality Date  . COLONOSCOPY    . NO PAST SURGERIES    . WISDOM TOOTH EXTRACTION      Current Outpatient Medications on File Prior to Visit  Medication Sig Dispense Refill  . amLODipine (NORVASC) 5 MG tablet Take 1 tablet (5 mg total) by mouth daily. 90 tablet 1  . aspirin EC 81 MG tablet Take 81 mg by mouth daily.    . Azelastine HCl 0.15 % SOLN PLACE 1-2 SPRAYS INTO THE NOSE 2 (TWO) TIMES A DAY. 30 mL 3  . fluticasone (FLONASE) 50 MCG/ACT nasal spray Place 2 sprays into both nostrils daily. 48 g 1  . ibuprofen (ADVIL,MOTRIN) 600 MG tablet Take 1 tablet by mouth as needed.  0  . lisinopril (PRINIVIL,ZESTRIL) 10 MG tablet Take 1 tablet (10 mg total) by mouth daily. 90 tablet 1  . Multiple Vitamins-Minerals (CENTRUM ADULTS) TABS Take 1 tablet by mouth daily.    . Omega-3 Fatty Acids (FISH OIL) 1200 MG CAPS Take 2 capsules by mouth daily.     . psyllium (METAMUCIL) 0.52 g capsule Take 0.52 g by mouth daily.    . tadalafil (CIALIS) 10 MG tablet Take 1  tablet (10 mg total) by mouth daily as needed for erectile dysfunction. 6 tablet 6  . terazosin (HYTRIN) 1 MG capsule Take 1 capsule (1 mg total) by mouth at bedtime. 90 capsule 1   No current facility-administered medications on file prior to visit.     No Known Allergies  Family History  Problem Relation Age of Onset  . Heart attack Father 27  . Heart disease Father   . Alcohol abuse Sister   . Heart attack Paternal Uncle 80  . Heart attack Paternal Grandfather 73  . Cancer Maternal Grandmother 13       Unsure    Social History   Socioeconomic History  . Marital status: Married    Spouse name: Not on file  . Number of children: Not on file  . Years of education: Not on file  . Highest education level: Not on file  Occupational History  . Not on file  Social Needs  . Financial resource strain: Not on file  . Food insecurity    Worry: Not on file    Inability: Not on file  . Transportation needs    Medical: Not on file    Non-medical: Not on file  Tobacco Use  . Smoking status: Former Smoker    Packs/day: 0.25    Years: 25.00  Pack years: 6.25    Types: Cigarettes    Quit date: 05/18/2015    Years since quitting: 3.6  . Smokeless tobacco: Never Used  Substance and Sexual Activity  . Alcohol use: Yes    Alcohol/week: 2.0 - 4.0 standard drinks    Types: 2 - 4 Standard drinks or equivalent per week    Comment: social  . Drug use: No    Types: Marijuana  . Sexual activity: Yes  Lifestyle  . Physical activity    Days per week: Not on file    Minutes per session: Not on file  . Stress: Not on file  Relationships  . Social Herbalist on phone: Not on file    Gets together: Not on file    Attends religious service: Not on file    Active member of club or organization: Not on file    Attends meetings of clubs or organizations: Not on file    Relationship status: Not on file  . Intimate partner violence    Fear of current or ex partner: Not on file     Emotionally abused: Not on file    Physically abused: Not on file    Forced sexual activity: Not on file  Other Topics Concern  . Not on file  Social History Narrative  . Not on file   Review of Systems  Constitutional: Negative for fever and weight loss.  HENT: Negative for ear discharge, ear pain, hearing loss and tinnitus.   Eyes: Negative for blurred vision, double vision, photophobia and pain.  Respiratory: Negative for cough and shortness of breath.   Cardiovascular: Negative for chest pain and palpitations.  Gastrointestinal: Negative for abdominal pain, blood in stool, constipation, diarrhea, heartburn, melena, nausea and vomiting.  Genitourinary: Negative for dysuria, flank pain, frequency, hematuria and urgency.  Musculoskeletal: Negative for falls.  Neurological: Negative for dizziness, loss of consciousness and headaches.  Endo/Heme/Allergies: Negative for environmental allergies.  Psychiatric/Behavioral: Negative for depression, hallucinations, substance abuse and suicidal ideas. The patient is not nervous/anxious and does not have insomnia.    BP 120/80   Pulse 61   Temp 98.4 F (36.9 C) (Skin)   Resp 16   Ht 5\' 9"  (1.753 m)   Wt 273 lb (123.8 kg)   SpO2 98%   BMI 40.32 kg/m   Physical Exam Vitals signs reviewed.  Constitutional:      General: He is not in acute distress.    Appearance: He is well-developed. He is not diaphoretic.  HENT:     Head: Normocephalic and atraumatic.     Right Ear: Tympanic membrane, ear canal and external ear normal.     Left Ear: Tympanic membrane, ear canal and external ear normal.     Nose: Nose normal.     Mouth/Throat:     Pharynx: No posterior oropharyngeal erythema.  Eyes:     Conjunctiva/sclera: Conjunctivae normal.     Pupils: Pupils are equal, round, and reactive to light.  Neck:     Musculoskeletal: Neck supple.     Thyroid: No thyromegaly.  Cardiovascular:     Rate and Rhythm: Normal rate and regular rhythm.      Heart sounds: Murmur (chronic systolic murmur -- negative prior echo. Asymptomatic) present.  Pulmonary:     Effort: Pulmonary effort is normal. No respiratory distress.     Breath sounds: Normal breath sounds. No wheezing or rales.  Chest:     Chest wall: No tenderness.  Abdominal:     General: Bowel sounds are normal. There is no distension.     Palpations: Abdomen is soft. There is no mass.     Tenderness: There is no abdominal tenderness. There is no guarding or rebound.  Lymphadenopathy:     Cervical: No cervical adenopathy.  Skin:    General: Skin is warm and dry.     Findings: No rash.  Neurological:     Mental Status: He is alert and oriented to person, place, and time.     Cranial Nerves: No cranial nerve deficit.    Assessment/Plan: 1. Visit for preventive health examination Depression screen negative. Health Maintenance reviewed. PSA monitored by Urology (Dr. Jeffie Pollock) Preventive schedule discussed and handout given in AVS. Will obtain fasting labs today.  - CBC with Differential/Platelet - Comprehensive metabolic panel - Hemoglobin A1c - Lipid panel  2. Essential hypertension, benign Stable today. Continue current regimen. Medications refilled. Labs today. - amLODipine (NORVASC) 5 MG tablet; Take 1 tablet (5 mg total) by mouth daily.  Dispense: 90 tablet; Refill: 1 - lisinopril (ZESTRIL) 10 MG tablet; Take 1 tablet (10 mg total) by mouth daily.  Dispense: 90 tablet; Refill: 1 - terazosin (HYTRIN) 1 MG capsule; Take 1 capsule (1 mg total) by mouth at bedtime.  Dispense: 90 capsule; Refill: 1 - Comprehensive metabolic panel - Hemoglobin A1c - Lipid panel  3. OSA (obstructive sleep apnea) Compliant with CPAP. Diet and exercise recommendations reviewed with patient.   4. Change in multiple nevi - Ambulatory referral to Dermatology   Leeanne Rio, PA-C

## 2019-01-11 NOTE — Patient Instructions (Signed)
Please go to the lab for blood work.   Our office will call you with your results unless you have chosen to receive results via MyChart.  If your blood work is normal we will follow-up each year for physicals and as scheduled for chronic medical problems.  If anything is abnormal we will treat accordingly and get you in for a follow-up.  You will be contacted for assessment with Dermatology. Keep your phone on for this.   You will be due for your 2nd shingrix vaccine between 2-6 months from now.    Preventive Care 38-75 Years Old, Male Preventive care refers to lifestyle choices and visits with your health care provider that can promote health and wellness. This includes:  A yearly physical exam. This is also called an annual well check.  Regular dental and eye exams.  Immunizations.  Screening for certain conditions.  Healthy lifestyle choices, such as eating a healthy diet, getting regular exercise, not using drugs or products that contain nicotine and tobacco, and limiting alcohol use. What can I expect for my preventive care visit? Physical exam Your health care provider will check:  Height and weight. These may be used to calculate body mass index (BMI), which is a measurement that tells if you are at a healthy weight.  Heart rate and blood pressure.  Your skin for abnormal spots. Counseling Your health care provider may ask you questions about:  Alcohol, tobacco, and drug use.  Emotional well-being.  Home and relationship well-being.  Sexual activity.  Eating habits.  Work and work Statistician. What immunizations do I need?  Influenza (flu) vaccine  This is recommended every year. Tetanus, diphtheria, and pertussis (Tdap) vaccine  You may need a Td booster every 10 years. Varicella (chickenpox) vaccine  You may need this vaccine if you have not already been vaccinated. Zoster (shingles) vaccine  You may need this after age 9. Measles, mumps, and  rubella (MMR) vaccine  You may need at least one dose of MMR if you were born in 1957 or later. You may also need a second dose. Pneumococcal conjugate (PCV13) vaccine  You may need this if you have certain conditions and were not previously vaccinated. Pneumococcal polysaccharide (PPSV23) vaccine  You may need one or two doses if you smoke cigarettes or if you have certain conditions. Meningococcal conjugate (MenACWY) vaccine  You may need this if you have certain conditions. Hepatitis A vaccine  You may need this if you have certain conditions or if you travel or work in places where you may be exposed to hepatitis A. Hepatitis B vaccine  You may need this if you have certain conditions or if you travel or work in places where you may be exposed to hepatitis B. Haemophilus influenzae type b (Hib) vaccine  You may need this if you have certain risk factors. Human papillomavirus (HPV) vaccine  If recommended by your health care provider, you may need three doses over 6 months. You may receive vaccines as individual doses or as more than one vaccine together in one shot (combination vaccines). Talk with your health care provider about the risks and benefits of combination vaccines. What tests do I need? Blood tests  Lipid and cholesterol levels. These may be checked every 5 years, or more frequently if you are over 55 years old.  Hepatitis C test.  Hepatitis B test. Screening  Lung cancer screening. You may have this screening every year starting at age 41 if you have a 30-pack-year  history of smoking and currently smoke or have quit within the past 15 years.  Prostate cancer screening. Recommendations will vary depending on your family history and other risks.  Colorectal cancer screening. All adults should have this screening starting at age 55 and continuing until age 29. Your health care provider may recommend screening at age 39 if you are at increased risk. You will have  tests every 1-10 years, depending on your results and the type of screening test.  Diabetes screening. This is done by checking your blood sugar (glucose) after you have not eaten for a while (fasting). You may have this done every 1-3 years.  Sexually transmitted disease (STD) testing. Follow these instructions at home: Eating and drinking  Eat a diet that includes fresh fruits and vegetables, whole grains, lean protein, and low-fat dairy products.  Take vitamin and mineral supplements as recommended by your health care provider.  Do not drink alcohol if your health care provider tells you not to drink.  If you drink alcohol: ? Limit how much you have to 0-2 drinks a day. ? Be aware of how much alcohol is in your drink. In the [place] one drink equals one 12 oz bottle of beer (355 mL), one 5 oz glass of wine (148 mL), or one 1 oz glass of hard liquor (44 mL). Lifestyle  Take daily care of your teeth and gums.  Stay active. Exercise for at least 30 minutes on 5 or more days each week.  Do not use any products that contain nicotine or tobacco, such as cigarettes, e-cigarettes, and chewing tobacco. If you need help quitting, ask your health care provider.  If you are sexually active, practice safe sex. Use a condom or other form of protection to prevent STIs (sexually transmitted infections).  Talk with your health care provider about taking a low-dose aspirin every day starting at age 36. What's next?  Go to your health care provider once a year for a well check visit.  Ask your health care provider how often you should have your eyes and teeth checked.  Stay up to date on all vaccines. This information is not intended to replace advice given to you by your health care provider. Make sure you discuss any questions you have with your health care provider. Document Released: 05/15/2015 Document Revised: 04/12/2018 Document Reviewed: 04/12/2018 Elsevier Patient Education  2020  [employer]. .

## 2019-01-18 DIAGNOSIS — G4733 Obstructive sleep apnea (adult) (pediatric): Secondary | ICD-10-CM | POA: Diagnosis not present

## 2019-02-07 DIAGNOSIS — L819 Disorder of pigmentation, unspecified: Secondary | ICD-10-CM | POA: Diagnosis not present

## 2019-02-07 DIAGNOSIS — L814 Other melanin hyperpigmentation: Secondary | ICD-10-CM | POA: Diagnosis not present

## 2019-02-07 DIAGNOSIS — D229 Melanocytic nevi, unspecified: Secondary | ICD-10-CM | POA: Diagnosis not present

## 2019-02-07 DIAGNOSIS — L821 Other seborrheic keratosis: Secondary | ICD-10-CM | POA: Diagnosis not present

## 2019-02-10 ENCOUNTER — Other Ambulatory Visit: Payer: Self-pay | Admitting: Physician Assistant

## 2019-02-13 ENCOUNTER — Encounter: Payer: Self-pay | Admitting: Physician Assistant

## 2019-02-17 DIAGNOSIS — G4733 Obstructive sleep apnea (adult) (pediatric): Secondary | ICD-10-CM | POA: Diagnosis not present

## 2019-03-20 DIAGNOSIS — G4733 Obstructive sleep apnea (adult) (pediatric): Secondary | ICD-10-CM | POA: Diagnosis not present

## 2019-03-25 ENCOUNTER — Other Ambulatory Visit: Payer: Self-pay

## 2019-03-25 ENCOUNTER — Ambulatory Visit (INDEPENDENT_AMBULATORY_CARE_PROVIDER_SITE_OTHER): Payer: BC Managed Care – PPO

## 2019-03-25 DIAGNOSIS — Z23 Encounter for immunization: Secondary | ICD-10-CM

## 2019-03-25 NOTE — Progress Notes (Signed)
Steven Sloan 58 y.o. male presents to office today for his 2nd shingles vaccine. Administered SHINGRIX 0.5 mL IM left arm per Raiford Noble, PA. Patient tolerated well.

## 2019-04-01 ENCOUNTER — Encounter: Payer: Self-pay | Admitting: Physician Assistant

## 2019-04-01 NOTE — Telephone Encounter (Signed)
Please call patient to set up pneuomnia vaccine and virtual visit. Thank you.

## 2019-04-02 ENCOUNTER — Ambulatory Visit (INDEPENDENT_AMBULATORY_CARE_PROVIDER_SITE_OTHER): Payer: BC Managed Care – PPO | Admitting: Physician Assistant

## 2019-04-02 ENCOUNTER — Encounter: Payer: Self-pay | Admitting: Physician Assistant

## 2019-04-02 ENCOUNTER — Other Ambulatory Visit: Payer: Self-pay

## 2019-04-02 VITALS — BP 133/74 | HR 67 | Temp 98.1°F | Ht 69.0 in | Wt 277.0 lb

## 2019-04-02 DIAGNOSIS — K529 Noninfective gastroenteritis and colitis, unspecified: Secondary | ICD-10-CM

## 2019-04-02 NOTE — Patient Instructions (Signed)
Instructions sent to MyChart.  Increase fluids. Keep well-balanced but blander diet. Start a daily probiotic.  Please come in tomorrow to pick up your stool kit.  You can return Thursday when you come in for your pneumonia shot. We will call with results and discuss next steps as soon as they are available. Hopefully this will give [place] some insight into what is going on.  We may have to set you up with Gastroenterology for further assessment.

## 2019-04-02 NOTE — Progress Notes (Signed)
I have discussed the procedure for the virtual visit with the patient who has given consent to proceed with assessment and treatment.   Jeyson Deshotel S Rosella Crandell, CMA     

## 2019-04-02 NOTE — Progress Notes (Signed)
Virtual Visit via Video   I connected with patient on 04/02/19 at  2:00 PM EST by a video enabled telemedicine application and verified that I am speaking with the correct person using two identifiers.  Location patient: Home Location provider: [facility], Office Persons participating in the virtual visit: Patient, Provider, PA-Student Drucilla Schmidt), CMA (Eduard Clos)  I discussed the limitations of evaluation and management by telemedicine and the availability of in person appointments. The patient expressed understanding and agreed to proceed.  Subjective:   HPI:   Patient presents via Doxy.Me for c/o diarrhea and vomiting. Has had three recent episodes of vomiting and diarrhea since April however this has been ongoing for several years. Has increased in frequency, most recent episode was 4 days ago.  Initially thought it was recurrent episodes of gastroenteritis. Starts with a burp that smells and tastes like "sulfur", followed by vomiting and diarrhea about 1-2 hours later. States if he eats yogurt it will delay the onset of vomiting and diarrhea. Is accompanied by bloating. Vomiting and diarrhea will occur every 30 minutes and last 24-36 hours and will feel fatigued afterwards. Emesis is non-bloody, Has not been able to identify specific trigger. Denies blood in stool, melena, hematemesis, abdominal pain or cramping, loss of appetite. No recent changes in diet. Has regular bowel movements otherwise, takes Metamucil x 10 years. Remote history of acid reflux in 2005.   ROS:   See pertinent positives and negatives per HPI.  Patient Active Problem List   Diagnosis Date Noted  . Systolic murmur [id number]  . Class 2 severe obesity due to excess calories with serious comorbidity and body mass index (BMI) of 37.0 to 37.9 in adult ([place]) 08/13/2018  . Prostate cancer screening 06/07/2017  . Internal hemorrhoid 06/07/2017  . Visit for preventive health examination 07/26/2015  .  Quit smoking 07/01/2015  . OSA (obstructive sleep apnea) 06/10/2015  . Essential hypertension, benign 06/10/2015    Social History   Tobacco Use  . Smoking status: Former Smoker    Packs/day: 0.25    Years: 25.00    Pack years: 6.25    Types: Cigarettes    Quit date: 05/18/2015    Years since quitting: 3.8  . Smokeless tobacco: Never Used  Substance Use Topics  . Alcohol use: Yes    Alcohol/week: 2.0 - 4.0 standard drinks    Types: 2 - 4 Standard drinks or equivalent per week    Comment: social    Current Outpatient Medications:  .  amLODipine (NORVASC) 5 MG tablet, Take 1 tablet (5 mg total) by mouth daily., Disp: 90 tablet, Rfl: 1 .  aspirin EC 81 MG tablet, Take 81 mg by mouth daily., Disp: , Rfl:  .  Azelastine HCl 0.15 % SOLN, PLACE 1-2 SPRAYS INTO THE NOSE 2 (TWO) TIMES A DAY., Disp: 90 mL, Rfl: 0 .  fluticasone (FLONASE) 50 MCG/ACT nasal spray, Place 2 sprays into both nostrils daily., Disp: 48 g, Rfl: 1 .  ibuprofen (ADVIL,MOTRIN) 600 MG tablet, Take 1 tablet by mouth as needed., Disp: , Rfl: 0 .  lisinopril (ZESTRIL) 10 MG tablet, Take 1 tablet (10 mg total) by mouth daily., Disp: 90 tablet, Rfl: 1 .  Multiple Vitamins-Minerals (CENTRUM ADULTS) TABS, Take 1 tablet by mouth daily., Disp: , Rfl:  .  Omega-3 Fatty Acids (FISH OIL) 1200 MG CAPS, Take 2 capsules by mouth daily. , Disp: , Rfl:  .  psyllium (METAMUCIL) 0.52 g capsule, Take 0.52 g by mouth  daily., Disp: , Rfl:  .  tadalafil (CIALIS) 10 MG tablet, Take 1 tablet (10 mg total) by mouth daily as needed for erectile dysfunction., Disp: 6 tablet, Rfl: 6 .  terazosin (HYTRIN) 1 MG capsule, Take 1 capsule (1 mg total) by mouth at bedtime., Disp: 90 capsule, Rfl: 1  No Known Allergies  Objective:   BP 133/74   Pulse 67   Temp 98.1 F (36.7 C) (Oral)   Wt 277 lb (125.6 kg)   SpO2 97%   BMI 40.91 kg/m   Patient is well-developed, well-nourished in no acute distress.  Resting comfortably at home.  Head is  normocephalic, atraumatic.  No labored breathing.  Speech is clear and coherent with logical contest.  Patient is alert and oriented at baseline.   Assessment and Plan:   1. Chronic diarrhea Unclear etiology. Comes in cycles with belching and vomiting. No identifiable triggers thus far. No alarm symptoms -- fever, weight loss, sweats -- present. Will check stool studies to include culture, fecal lactoferrin, O/P and c. Diff. Recent labs including CBC unremarkable overall except for 0.2% elevation in eosinophils. Start daily probiotic. Will likely sent to Gastroenterology but would like to have preliminary workup for them first.     Leeanne Rio, PA-C 04/02/2019

## 2019-04-04 ENCOUNTER — Ambulatory Visit (INDEPENDENT_AMBULATORY_CARE_PROVIDER_SITE_OTHER): Payer: BC Managed Care – PPO

## 2019-04-04 ENCOUNTER — Other Ambulatory Visit: Payer: Self-pay

## 2019-04-04 DIAGNOSIS — Z23 Encounter for immunization: Secondary | ICD-10-CM | POA: Diagnosis not present

## 2019-04-04 DIAGNOSIS — K529 Noninfective gastroenteritis and colitis, unspecified: Secondary | ICD-10-CM | POA: Diagnosis not present

## 2019-04-04 NOTE — Progress Notes (Signed)
Steven Sloan 58 y.o. male presents to office today for nurse visit. Administered PNEUMOVAX 0.5 mL IM left arm per Raiford Noble, PA. Patient tolerated well.

## 2019-04-04 NOTE — Addendum Note (Signed)
Addended by: Doran Clay A on: 04/04/2019 02:35 PM   Modules accepted: Orders

## 2019-04-04 NOTE — Addendum Note (Signed)
Addended by: Doran Clay A on: 04/04/2019 01:55 PM   Modules accepted: Orders

## 2019-04-05 LAB — CLOSTRIDIUM DIFFICILE BY PCR: Toxigenic C. Difficile by PCR: NEGATIVE

## 2019-04-08 LAB — FECAL LACTOFERRIN, QUANT

## 2019-04-08 LAB — STOOL CULTURE: E coli, Shiga toxin Assay: NEGATIVE

## 2019-04-16 ENCOUNTER — Encounter: Payer: Self-pay | Admitting: Physician Assistant

## 2019-04-16 MED ORDER — AZELASTINE HCL 0.15 % NA SOLN: 1.0000 | Freq: Every day | NASAL | 1 refills | Status: DC | PRN

## 2019-04-19 DIAGNOSIS — G4733 Obstructive sleep apnea (adult) (pediatric): Secondary | ICD-10-CM | POA: Diagnosis not present

## 2019-04-23 ENCOUNTER — Encounter: Payer: Self-pay | Admitting: Physician Assistant

## 2019-05-20 DIAGNOSIS — G4733 Obstructive sleep apnea (adult) (pediatric): Secondary | ICD-10-CM | POA: Diagnosis not present

## 2019-06-20 DIAGNOSIS — G4733 Obstructive sleep apnea (adult) (pediatric): Secondary | ICD-10-CM | POA: Diagnosis not present

## 2019-07-12 ENCOUNTER — Other Ambulatory Visit: Payer: Self-pay

## 2019-07-14 ENCOUNTER — Encounter: Payer: Self-pay | Admitting: Physician Assistant

## 2019-07-15 ENCOUNTER — Encounter: Payer: Self-pay | Admitting: Physician Assistant

## 2019-07-15 ENCOUNTER — Ambulatory Visit (INDEPENDENT_AMBULATORY_CARE_PROVIDER_SITE_OTHER): Payer: BC Managed Care – PPO | Admitting: Physician Assistant

## 2019-07-15 ENCOUNTER — Other Ambulatory Visit: Payer: Self-pay

## 2019-07-15 VITALS — BP 109/69 | HR 63 | Temp 97.7°F | Resp 16 | Wt 270.0 lb

## 2019-07-15 DIAGNOSIS — I10 Essential (primary) hypertension: Secondary | ICD-10-CM

## 2019-07-15 MED ORDER — AZELASTINE HCL 0.15 % NA SOLN: 1.0000 | Freq: Every day | NASAL | 1 refills | Status: DC | PRN

## 2019-07-15 NOTE — Progress Notes (Signed)
Virtual Visit via Video   I connected with patient on 07/15/19 at  9:00 AM EDT by a video enabled telemedicine application and verified that I am speaking with the correct person using two identifiers.  Location patient: Home Location provider: [facility], Office Persons participating in the virtual visit: Patient, Provider, [place] (Patina Moore)  I discussed the limitations of evaluation and management by telemedicine and the availability of in person appointments. The patient expressed understanding and agreed to proceed.  Subjective:   HPI:   Patient presents via doxy.need today for 27-month follow-up of obesity and hypertension.  Patient is currently on a regimen of amlodipine 5 mg daily, lisinopril 10 mg daily. ENdorses taking medications as directed.  For diet, is trying to keep a low-salt intake.  In regards to exercise, trying to work on increasing the amount he gets per week.  Feels he has been successful with this. Patient denies chest pain, palpitations, lightheadedness, dizziness, vision changes or frequent headaches.  Patient has noted some increased fatigue.  Has history of sleep apnea currently on CPAP (just calibrated 6 months ago) and resting well at night.  States he feels tired sometimes after taking his blood pressure medication.  ROS:   See pertinent positives and negatives per HPI.  Patient Active Problem List   Diagnosis Date Noted  . Systolic murmur [id number]  . Class 2 severe obesity due to excess calories with serious comorbidity and body mass index (BMI) of 37.0 to 37.9 in adult ([place]) 08/13/2018  . Prostate cancer screening 06/07/2017  . Internal hemorrhoid 06/07/2017  . Visit for preventive health examination 07/26/2015  . Quit smoking 07/01/2015  . OSA (obstructive sleep apnea) 06/10/2015  . Essential hypertension, benign 06/10/2015    Social History   Tobacco Use  . Smoking status: Former Smoker    Packs/day: 0.25    Years: 25.00    Pack  years: 6.25    Types: Cigarettes    Quit date: 05/18/2015    Years since quitting: 4.1  . Smokeless tobacco: Never Used  Substance Use Topics  . Alcohol use: Yes    Alcohol/week: 2.0 - 4.0 standard drinks    Types: 2 - 4 Standard drinks or equivalent per week    Comment: social    Current Outpatient Medications:  .  amLODipine (NORVASC) 5 MG tablet, Take 1 tablet (5 mg total) by mouth daily., Disp: 90 tablet, Rfl: 1 .  aspirin EC 81 MG tablet, Take 81 mg by mouth daily., Disp: , Rfl:  .  Azelastine HCl 0.15 % SOLN, Place 1-2 sprays into the nose daily as needed., Disp: 90 mL, Rfl: 1 .  fluticasone (FLONASE) 50 MCG/ACT nasal spray, Place 2 sprays into both nostrils daily., Disp: 48 g, Rfl: 1 .  ibuprofen (ADVIL,MOTRIN) 600 MG tablet, Take 1 tablet by mouth as needed., Disp: , Rfl: 0 .  lisinopril (ZESTRIL) 10 MG tablet, Take 1 tablet (10 mg total) by mouth daily., Disp: 90 tablet, Rfl: 1 .  Multiple Vitamins-Minerals (CENTRUM ADULTS) TABS, Take 1 tablet by mouth daily., Disp: , Rfl:  .  Omega-3 Fatty Acids (FISH OIL) 1200 MG CAPS, Take 2 capsules by mouth daily. , Disp: , Rfl:  .  psyllium (METAMUCIL) 0.52 g capsule, Take 0.52 g by mouth daily., Disp: , Rfl:  .  tadalafil (CIALIS) 10 MG tablet, Take 1 tablet (10 mg total) by mouth daily as needed for erectile dysfunction., Disp: 6 tablet, Rfl: 6 .  terazosin (HYTRIN) 1 MG  capsule, Take 1 capsule (1 mg total) by mouth at bedtime., Disp: 90 capsule, Rfl: 1  No Known Allergies  Objective:   BP 109/69 (BP Location: Left Arm)   Pulse 63   Temp 97.7 F (36.5 C) (Oral)   Resp 16   Wt 270 lb (122.5 kg)   SpO2 98%   BMI 39.87 kg/m   Patient is well-developed, well-nourished in no acute distress.  Resting comfortably at home.  Head is normocephalic, atraumatic.  No labored breathing.  Speech is clear and coherent with logical content.  Patient is alert and oriented at baseline.   Assessment and Plan:   1. Essential hypertension,  benign Blood pressure stable, actually at the low end of normal.  Reviewed patient's blood pressure log.  Blood pressures been very consistent since he cut out caffeine in the morning.  Asymptomatic other than some mild fatigue.  Will decrease amlodipine to 2.5 mg daily.  He is to continue home ambulatory blood pressure checks.  Want to make sure this remains stable but see if his fatigue improves.  He is to follow-up in 7 to 10 days to let us know how things are going so he can make further adjustments.     Leeanne Rio, PA-C 07/15/2019

## 2019-07-15 NOTE — Progress Notes (Signed)
I have discussed the procedure for the virtual visit with the patient who has given consent to proceed with assessment and treatment.   Shakaya Bhullar S Renette Hsu, CMA     

## 2019-07-15 NOTE — Patient Instructions (Signed)
Instructions sent to MyChart.  Please keep well-hydrated and get plenty of rest. Please continue low-salt diet. Please continue working on aerobic exercise.  The goal is 450 minutes/week. For now, decrease amlodipine to half a tablet daily (2.5 mg). Continue lisinopril as directed. Continue checking blood pressures daily and record. Follow-up with me via MyChart or phone in 7 to 10 days so we can see how levels are and how you are feeling. We will make further adjustments at that time.

## 2019-07-18 DIAGNOSIS — G4733 Obstructive sleep apnea (adult) (pediatric): Secondary | ICD-10-CM | POA: Diagnosis not present

## 2019-08-05 ENCOUNTER — Encounter: Payer: Self-pay | Admitting: Physician Assistant

## 2019-08-18 DIAGNOSIS — G4733 Obstructive sleep apnea (adult) (pediatric): Secondary | ICD-10-CM | POA: Diagnosis not present

## 2019-09-03 ENCOUNTER — Encounter: Payer: Self-pay | Admitting: Physician Assistant

## 2019-11-05 ENCOUNTER — Other Ambulatory Visit: Payer: Self-pay | Admitting: Physician Assistant

## 2019-11-05 DIAGNOSIS — I10 Essential (primary) hypertension: Secondary | ICD-10-CM

## 2019-12-23 ENCOUNTER — Other Ambulatory Visit: Payer: Self-pay

## 2019-12-23 ENCOUNTER — Encounter: Payer: Self-pay | Admitting: Adult Health

## 2019-12-23 ENCOUNTER — Ambulatory Visit (INDEPENDENT_AMBULATORY_CARE_PROVIDER_SITE_OTHER): Payer: BC Managed Care – PPO | Admitting: Adult Health

## 2019-12-23 DIAGNOSIS — G4733 Obstructive sleep apnea (adult) (pediatric): Secondary | ICD-10-CM

## 2019-12-23 DIAGNOSIS — Z6837 Body mass index (BMI) 37.0-37.9, adult: Secondary | ICD-10-CM | POA: Diagnosis not present

## 2019-12-23 NOTE — Assessment & Plan Note (Signed)
Healthy weight loss 

## 2019-12-23 NOTE — Assessment & Plan Note (Signed)
Excellent control and compliance on nocturnal CPAP.  No changes.  OSA on CPAP patient education given  Plan  Patient Instructions  Keep up the good work Continue on CPAP at bedtime Work on healthy weight Do not drive a sleepy Follow-up in 1 year with Dr. Elsworth Soho and as needed

## 2019-12-23 NOTE — Patient Instructions (Signed)
Keep up the good work Continue on CPAP at bedtime Work on healthy weight Do not drive a sleepy Follow-up in 1 year with Dr. Elsworth Soho and as needed

## 2019-12-23 NOTE — Progress Notes (Signed)
@Patient  ID: Steven Sloan, male    DOB: 02-Aug-1960, 59 y.o.   MRN: [id number]  Chief Complaint  Patient presents with  . Follow-up    OSA     Referring provider: Delorse Limber  HPI: 59 year old male followed for obstructive sleep apnea   TEST/EVENTS :  2007 PSG AHI 40-60/hr  12/23/2019 Follow up: OSA  Patient presents for a 1 year follow-up.  Patient has underlying severe sleep apnea.  He is on nocturnal CPAP.  Patient says he is doing very well on his CPAP.  He never misses a night.  Feels that he is rested and benefits from nocturnal CPAP.  CPAP download shows excellent compliance with 100% usage.  Daily average usage at 8.5 hours.  Patient is on auto CPAP 10 to 14 cm H2O.  AHI is 2.2/hour.  Minimal leaks.   No Known Allergies  Immunization History  Administered Date(s) Administered  . Influenza,inj,Quad PF,6+ Mos 06/10/2015, 01/22/2016, 01/30/2017, 02/16/2018, 12/26/2018  . Influenza-Unspecified 01/30/2014  . PFIZER SARS-COV-2 Vaccination 07/23/2019, 08/13/2019  . Pneumococcal Polysaccharide-23 04/04/2019  . Td 05/02/2006  . Tdap 05/31/2016  . Zoster Recombinat (Shingrix) 01/11/2019, 03/25/2019    Past Medical History:  Diagnosis Date  . Allergy    Seasonal  . Chicken pox   . Heart murmur    Asymptomatic -- Patient endorses previous negative evaluation  . Hypertension   . Sleep apnea    CPAP nightly    Tobacco History: Social History   Tobacco Use  Smoking Status Former Smoker  . Packs/day: 0.25  . Years: 25.00  . Pack years: 6.25  . Types: Cigarettes  . Quit date: 05/18/2015  . Years since quitting: 4.6  Smokeless Tobacco Never Used   Counseling given: Not Answered   Outpatient Medications Prior to Visit  Medication Sig Dispense Refill  . aspirin EC 81 MG tablet Take 81 mg by mouth daily.    . Azelastine HCl 0.15 % SOLN Place 1-2 sprays into the nose daily as needed. 90 mL 1  . fluticasone (FLONASE) 50 MCG/ACT nasal spray Place 2  sprays into both nostrils daily. 48 g 1  . ibuprofen (ADVIL,MOTRIN) 600 MG tablet Take 1 tablet by mouth as needed.  0  . lisinopril (ZESTRIL) 10 MG tablet TAKE 1 TABLET BY MOUTH  DAILY 90 tablet 3  . Multiple Vitamins-Minerals (CENTRUM ADULTS) TABS Take 1 tablet by mouth daily.    . Omega-3 Fatty Acids (FISH OIL) 1200 MG CAPS Take 2 capsules by mouth daily.     . psyllium (METAMUCIL) 0.52 g capsule Take 0.52 g by mouth daily.    Marland Kitchen terazosin (HYTRIN) 1 MG capsule TAKE 1 CAPSULE BY MOUTH AT  BEDTIME 90 capsule 3  . amLODipine (NORVASC) 5 MG tablet Take 1 tablet (5 mg total) by mouth daily. 90 tablet 1  . tadalafil (CIALIS) 10 MG tablet Take 1 tablet (10 mg total) by mouth daily as needed for erectile dysfunction. 6 tablet 6   No facility-administered medications prior to visit.     Review of Systems:   Constitutional:   No  weight loss, night sweats,  Fevers, chills, fatigue, or  lassitude.  HEENT:   No headaches,  Difficulty swallowing,  Tooth/dental problems, or  Sore throat,                No sneezing, itching, ear ache, nasal congestion, post nasal drip,   CV:  No chest pain,  Orthopnea, PND, swelling in lower extremities, anasarca,  dizziness, palpitations, syncope.   GI  No heartburn, indigestion, abdominal pain, nausea, vomiting, diarrhea, change in bowel habits, loss of appetite, bloody stools.   Resp: No shortness of breath with exertion or at rest.  No excess mucus, no productive cough,  No non-productive cough,  No coughing up of blood.  No change in color of mucus.  No wheezing.  No chest wall deformity  Skin: no rash or lesions.  GU: no dysuria, change in color of urine, no urgency or frequency.  No flank pain, no hematuria   MS:  No joint pain or swelling.  No decreased range of motion.  No back pain.    Physical Exam  BP 134/86 (BP Location: Left Arm, Cuff Size: Normal)   Pulse 70   Temp (!) 96.6 F (35.9 C) (Other (Comment)) Comment (Src): wrist  Ht 5\' 10"   (1.778 m)   Wt 269 lb 12.8 oz (122.4 kg)   SpO2 96% Comment: Room air  BMI 38.71 kg/m   GEN: A/Ox3; pleasant , NAD, well nourished    HEENT:  [place]/AT,   NOSE-clear, THROAT-clear, no lesions, no postnasal drip or exudate noted.  Class II-III MP airway  NECK:  Supple w/ fair ROM; no JVD; normal carotid impulses w/o bruits; no thyromegaly or nodules palpated; no lymphadenopathy.    RESP  Clear  P & A; w/o, wheezes/ rales/ or rhonchi. no accessory muscle use, no dullness to percussion  CARD:  RRR, no m/r/g, no peripheral edema, pulses intact, no cyanosis or clubbing.  GI:   Soft & nt; nml bowel sounds; no organomegaly or masses detected.   Musco: Warm bil, no deformities or joint swelling noted.   Neuro: alert, no focal deficits noted.    Skin: Warm, no lesions or rashes    Lab Results:  CBC  BNP No results found for: BNP  ProBNP No results found for: PROBNP  Imaging: No results found.    No flowsheet data found.  No results found for: NITRICOXIDE      Assessment & Plan:   OSA (obstructive sleep apnea) Excellent control and compliance on nocturnal CPAP.  No changes.  OSA on CPAP patient education given  Plan  Patient Instructions  Keep up the good work Continue on CPAP at bedtime Work on healthy weight Do not drive a sleepy Follow-up in 1 year with Dr. Elsworth Soho and as needed     Class 2 severe obesity due to excess calories with serious comorbidity and body mass index (BMI) of 37.0 to 37.9 in adult [facility]) Healthy weight loss     Rexene Edison, NP 12/23/2019

## 2020-01-27 ENCOUNTER — Ambulatory Visit (INDEPENDENT_AMBULATORY_CARE_PROVIDER_SITE_OTHER): Payer: BC Managed Care – PPO

## 2020-01-27 ENCOUNTER — Other Ambulatory Visit: Payer: Self-pay

## 2020-01-27 DIAGNOSIS — Z23 Encounter for immunization: Secondary | ICD-10-CM

## 2020-05-05 ENCOUNTER — Other Ambulatory Visit: Payer: Self-pay | Admitting: Physician Assistant

## 2020-05-05 ENCOUNTER — Encounter: Payer: Self-pay | Admitting: Physician Assistant

## 2020-05-06 ENCOUNTER — Other Ambulatory Visit: Payer: Self-pay

## 2020-05-06 MED ORDER — FLUTICASONE PROPIONATE 50 MCG/ACT NA SUSP: 2.0000 | Freq: Every day | NASAL | 0 refills | Status: DC

## 2020-05-06 NOTE — Telephone Encounter (Signed)
Ok to refill nasal spray. Let him know he is overdue for follow-up regarding chronic issues and have him go ahead and schedule his physical this year. Thank you.

## 2020-06-24 ENCOUNTER — Other Ambulatory Visit: Payer: Self-pay | Admitting: Physician Assistant

## 2020-08-31 ENCOUNTER — Other Ambulatory Visit: Payer: Self-pay

## 2020-08-31 DIAGNOSIS — I10 Essential (primary) hypertension: Secondary | ICD-10-CM

## 2020-08-31 MED ORDER — TERAZOSIN HCL 1 MG PO CAPS: 1.0000 mg | ORAL_CAPSULE | Freq: Every day | ORAL | 0 refills | Status: DC

## 2020-08-31 MED ORDER — LISINOPRIL 10 MG PO TABS: 1.0000 | ORAL_TABLET | Freq: Every day | ORAL | 0 refills | Status: DC

## 2020-11-15 ENCOUNTER — Other Ambulatory Visit: Payer: Self-pay | Admitting: Family

## 2020-11-15 DIAGNOSIS — I10 Essential (primary) hypertension: Secondary | ICD-10-CM

## 2020-11-16 ENCOUNTER — Other Ambulatory Visit: Payer: Self-pay

## 2020-11-16 ENCOUNTER — Ambulatory Visit (INDEPENDENT_AMBULATORY_CARE_PROVIDER_SITE_OTHER): Payer: BC Managed Care – PPO | Admitting: Family Medicine

## 2020-11-16 VITALS — BP 138/82 | HR 68 | Temp 98.2°F | Resp 16 | Ht 70.0 in | Wt 271.6 lb

## 2020-11-16 DIAGNOSIS — Z1211 Encounter for screening for malignant neoplasm of colon: Secondary | ICD-10-CM

## 2020-11-16 DIAGNOSIS — G4733 Obstructive sleep apnea (adult) (pediatric): Secondary | ICD-10-CM

## 2020-11-16 DIAGNOSIS — I10 Essential (primary) hypertension: Secondary | ICD-10-CM

## 2020-11-16 DIAGNOSIS — J309 Allergic rhinitis, unspecified: Secondary | ICD-10-CM

## 2020-11-16 DIAGNOSIS — Z13220 Encounter for screening for lipoid disorders: Secondary | ICD-10-CM | POA: Diagnosis not present

## 2020-11-16 DIAGNOSIS — R011 Cardiac murmur, unspecified: Secondary | ICD-10-CM

## 2020-11-16 DIAGNOSIS — Z9989 Dependence on other enabling machines and devices: Secondary | ICD-10-CM

## 2020-11-16 DIAGNOSIS — E669 Obesity, unspecified: Secondary | ICD-10-CM | POA: Diagnosis not present

## 2020-11-16 DIAGNOSIS — Z6838 Body mass index (BMI) 38.0-38.9, adult: Secondary | ICD-10-CM | POA: Diagnosis not present

## 2020-11-16 DIAGNOSIS — Z131 Encounter for screening for diabetes mellitus: Secondary | ICD-10-CM

## 2020-11-16 LAB — COMPREHENSIVE METABOLIC PANEL
ALT: 24 U/L (ref 0–53)
AST: 19 U/L (ref 0–37)
Albumin: 4.6 g/dL (ref 3.5–5.2)
Alkaline Phosphatase: 53 U/L (ref 39–117)
BUN: 23 mg/dL (ref 6–23)
CO2: 26 mEq/L (ref 19–32)
Calcium: 9.8 mg/dL (ref 8.4–10.5)
Chloride: 103 mEq/L (ref 96–112)
Creatinine, Ser: 1.4 mg/dL (ref 0.40–1.50)
GFR: 55.72 mL/min — ABNORMAL LOW (ref >60.00)
Glucose, Bld: 115 mg/dL — ABNORMAL HIGH (ref 70–99)
Potassium: 4.4 mEq/L (ref 3.5–5.1)
Sodium: 138 mEq/L (ref 135–145)
Total Bilirubin: 0.9 mg/dL (ref 0.2–1.2)
Total Protein: 7.6 g/dL (ref 6.0–8.3)

## 2020-11-16 LAB — LIPID PANEL
Cholesterol: 183 mg/dL (ref 0–200)
HDL: 48.3 mg/dL (ref >39.00)
LDL Cholesterol: 114 mg/dL — ABNORMAL HIGH (ref 0–99)
NonHDL: 135.18
Total CHOL/HDL Ratio: 4
Triglycerides: 105 mg/dL (ref 0.0–149.0)
VLDL: 21 mg/dL (ref 0.0–40.0)

## 2020-11-16 LAB — HEMOGLOBIN A1C: Hgb A1c MFr Bld: 5.3 % (ref 4.6–6.5)

## 2020-11-16 MED ORDER — AZELASTINE HCL 0.15 % NA SOLN: 1.0000 | Freq: Every day | NASAL | 3 refills | Status: DC | PRN

## 2020-11-16 MED ORDER — LISINOPRIL 10 MG PO TABS: 10.0000 mg | ORAL_TABLET | Freq: Every day | ORAL | 3 refills | Status: DC

## 2020-11-16 MED ORDER — FLUTICASONE PROPIONATE 50 MCG/ACT NA SUSP: NASAL | 11 refills | Status: DC

## 2020-11-16 MED ORDER — TERAZOSIN HCL 1 MG PO CAPS: 1.0000 mg | ORAL_CAPSULE | Freq: Every day | ORAL | 3 refills | Status: DC

## 2020-11-16 NOTE — Progress Notes (Signed)
Subjective:  Patient ID: Steven Sloan, male    DOB: 1961-01-05  Age: 60 y.o. 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  60yo dtr, autism spectrum d/o. High functioning.  Wife works for [employer].   Hypertension: lisinopril 10 mg daily, Terazosin 1 mg daily (hx of slightly enlarged prostate) Has urologist - 1 year checkup soon. No hx prostate CA. Prior BX negative.  Home readings - better than here. White coat HTN component. 96/60-139/70.  Recent readings - 107/67 (left arm), 127/64 R arm. Had vascular eval - no known concerns or coarctation. Peak HR on R arm past few weeks 134/72.  Drowsiness in past with amlodipine - not used in past year or more. Chronic systolic murmur. Remote cardiology eval, minor, hard to hear in past.    BP Readings from Last 3 Encounters:  11/16/20 138/82  12/23/19 134/86  07/15/19 109/69   Lab Results  Component Value Date   CREATININE 1.32 01/11/2019   Obesity with obstructive sleep apnea Uses CPAP nightly. Feels well rested. At least 8 hrs sleep.  Exercise:none past 64months, prior walking, less with achilles issue. Improving.  Fast food/sweet tea/soda: none usually.   Body mass index is 38.97 kg/m.  Lab Results  Component Value Date   HGBA1C 5.3 01/11/2019   Wt Readings from Last 3 Encounters:  11/16/20 271 lb 9.6 oz (123.2 kg)  12/23/19 269 lb 12.8 oz (122.4 kg)  07/15/19 270 lb (122.5 kg)   Allergic rhinitis Azelastine nasal spray as needed previously. Uses daily, and flonase. Working well for allergies.   Erectile dysfunction Treated  with Cialis 10 mg previously. No ne needed recently.   Hyperlipidemia: Slight elevated LDL in 2020.  Has taken fish oil, no current statin. Fasting today.  Lab Results  Component Value Date   CHOL 182 01/11/2019   HDL 45.10 01/11/2019   LDLCALC 116 (H) 01/11/2019   TRIG 104.0 01/11/2019   CHOLHDL 4 01/11/2019   Lab Results  Component Value Date   ALT 28 01/11/2019   AST 19 01/11/2019   ALKPHOS 56 01/11/2019   BILITOT 1.2 01/11/2019   Health maintenance Recent colonoscopy referral today.Last one in 2017.  COVID-19 vaccine and 2 boosters received.    History Patient Active Problem List   Diagnosis Date Noted   Systolic murmur 56/38/7564   Class 2 severe obesity due to excess calories with serious comorbidity and body mass index (BMI) of 37.0 to 37.9 in adult [facility]) 08/13/2018   Prostate cancer screening 06/07/2017   Internal hemorrhoid 06/07/2017   Visit for preventive health examination 07/26/2015   Quit smoking 07/01/2015   OSA (obstructive sleep apnea) 06/10/2015   Essential hypertension, benign 06/10/2015   Past Medical History:  Diagnosis Date   Allergy    Seasonal   Chicken pox    Heart murmur    Asymptomatic -- Patient endorses previous negative evaluation   Hypertension    Sleep apnea    CPAP nightly   Past Surgical History:  Procedure Laterality Date   COLONOSCOPY  NO PAST SURGERIES     WISDOM TOOTH EXTRACTION     No Known Allergies Prior to Admission medications   Medication Sig Start Date End Date Taking? Authorizing Provider  aspirin EC 81 MG tablet Take 81 mg by mouth daily.   Yes [provider]  Azelastine HCl 0.15 % SOLN Place 1-2 sprays into the nose daily as needed. 07/15/19  Yes Brunetta Jeans, PA-C  fluticasone [facility]) 50 MCG/ACT nasal spray USE 2 SPRAYS IN BOTH  NOSTRILS DAILY 06/24/20  Yes Dutch Quint B, FNP  ibuprofen (ADVIL,MOTRIN) 600 MG tablet Take 1 tablet by mouth as needed. 04/09/15  Yes [provider]   lisinopril (ZESTRIL) 10 MG tablet Take 1 tablet (10 mg total) by mouth daily. 08/31/20  Yes Dutch Quint B, FNP  Multiple Vitamins-Minerals (CENTRUM ADULTS) TABS Take 1 tablet by mouth daily.   Yes [provider]  Omega-3 Fatty Acids (FISH OIL) 1200 MG CAPS Take 2 capsules by mouth daily.    Yes [provider]  psyllium (REGULOID) 0.52 g capsule Take 0.52 g by mouth daily.   Yes [provider]  terazosin (HYTRIN) 1 MG capsule Take 1 capsule (1 mg total) by mouth at bedtime. 08/31/20  Yes Dutch Quint B, FNP  amLODipine (NORVASC) 5 MG tablet Take 1 tablet (5 mg total) by mouth daily. 01/11/19   Brunetta Jeans, PA-C  tadalafil (CIALIS) 10 MG tablet Take 1 tablet (10 mg total) by mouth daily as needed for erectile dysfunction. 06/23/16   Brunetta Jeans, PA-C   Social History   Socioeconomic History   Marital status: Married    Spouse name: Not on file   Number of children: Not on file   Years of education: Not on file   Highest education level: Not on file  Occupational History   Not on file  Tobacco Use   Smoking status: Former    Packs/day: 0.25    Years: 25.00    Pack years: 6.25    Types: Cigarettes    Quit date: 05/18/2015    Years since quitting: 5.5   Smokeless tobacco: Never  Vaping Use   Vaping Use: Never used  Substance and Sexual Activity   Alcohol use: Yes    Alcohol/week: 2.0 - 4.0 standard drinks    Types: 2 - 4 Standard drinks or equivalent per week    Comment: social   Drug use: No    Types: Marijuana   Sexual activity: Yes  Other Topics Concern   Not on file  Social History Narrative   Not on file   Social Determinants of Health   Financial Resource Strain: Not on file  Food Insecurity: Not on file  Transportation Needs: Not on file  Physical Activity: Not on file  Stress: Not on file  Social Connections: Not on file  Intimate Partner Violence: Not on file    Review of Systems  Constitutional:  Negative for fatigue  and unexpected weight change.  Eyes:  Negative for visual disturbance.  Respiratory:  Negative for cough, chest tightness and shortness of breath.   Cardiovascular:  Negative for chest pain, palpitations and leg swelling.  Gastrointestinal:  Negative for abdominal pain and blood in stool.  Neurological:  Negative for dizziness, light-headedness and headaches.    Objective:   Vitals:   11/16/20 0924  BP: 138/82  Pulse: 68  Resp: 16  Temp: 98.2 F (36.8 C)  TempSrc: Temporal  SpO2: 95%  Weight: 271 lb 9.6  oz (123.2 kg)  Height: 5\' 10"  (1.778 m)     Physical Exam Vitals reviewed.  Constitutional:      Appearance: He is well-developed. He is obese.  HENT:     Head: Normocephalic and atraumatic.  Neck:     Vascular: No carotid bruit or JVD.  Cardiovascular:     Rate and Rhythm: Normal rate and regular rhythm.     Heart sounds: Murmur (3/6 systolic.) heard.  Pulmonary:     Effort: Pulmonary effort is normal.     Breath sounds: Normal breath sounds. No rales.  Abdominal:     General: Abdomen is flat.     Tenderness: There is no abdominal tenderness.  Musculoskeletal:     Right lower leg: No edema.     Left lower leg: No edema.  Lymphadenopathy:     Cervical: No cervical adenopathy.  Skin:    General: Skin is warm and dry.  Neurological:     Mental Status: He is alert and oriented to person, place, and time.  Psychiatric:        Mood and Affect: Mood normal.    Assessment & Plan:  Steven Sloan is a 60 y.o. male . Systolic murmur - Plan: Ambulatory referral to Cardiology  - reports faint initially, no recent echo/cardiology eval, refer to cardiology to decide on need for echo, or ongoing monitoring.  Asymptomatic.  Screen for colon cancer - Plan: Ambulatory referral to Gastroenterology  Essential hypertension, benign - Plan: terazosin (HYTRIN) 1 MG capsule, lisinopril (ZESTRIL) 10 MG tablet  -Stable based on home readings, suspect whitecoat hypertension  component.  Compliant with CPAP.  Walking/low intensity exercise discussed.  No med changes for now.  Continue follow-up with urology as planned.  Screening for hyperlipidemia - Plan: Comprehensive metabolic panel, Lipid panel  -Borderline LDL previously.  Check labs.  Lifestyle modification with watching diet, increasing exercise as tolerated.  RTC precautions if return of Achilles tendinopathy issues  OSA on CPAP  -Compliant, feels well rested.  Continue CPAP  BMI 38.0-38.9,adult - Plan: Hemoglobin A1c  -As above, restart of walking/exercise.  44-month follow-up for physical, check A1c.  Allergic rhinitis, unspecified seasonality, unspecified trigger - Plan: fluticasone (FLONASE) 50 MCG/ACT nasal spray, Azelastine HCl 0.15 % SOLN  -Stable, meds refilled  Screening for diabetes mellitus - Plan: Hemoglobin A1c   Meds ordered this encounter  Medications   terazosin (HYTRIN) 1 MG capsule    Sig: Take 1 capsule (1 mg total) by mouth at bedtime.    Dispense:  90 capsule    Refill:  3    Requesting 1 year supply   fluticasone (FLONASE) 50 MCG/ACT nasal spray    Sig: USE 2 SPRAYS IN BOTH  NOSTRILS DAILY    Dispense:  48 g    Refill:  11   lisinopril (ZESTRIL) 10 MG tablet    Sig: Take 1 tablet (10 mg total) by mouth daily.    Dispense:  90 tablet    Refill:  3    Requesting 1 year supply   Azelastine HCl 0.15 % SOLN    Sig: Place 1-2 sprays into the nose daily as needed.    Dispense:  90 mL    Refill:  3   Patient Instructions  Start back with walking. Follow up if return of achilles issue. Start low, go slow.  No med changes today. I will let you know if any concerns on labs.   I will refer you to cardiology to see  if updated echo needed.  I will refer you for colonoscopy.   Thanks for coming in today. Let me know if there are any questions.      Signed,   Merri Ray, MD [place] 11/16/20 10:23  AM

## 2020-11-16 NOTE — Patient Instructions (Addendum)
Start back with walking. Follow up if return of achilles issue. Start low, go slow.  No med changes today. I will let you know if any concerns on labs.   I will refer you to cardiology to see if updated echo needed.  I will refer you for colonoscopy.   Thanks for coming in today. Let me know if there are any questions.

## 2020-11-30 ENCOUNTER — Encounter: Payer: Self-pay | Admitting: Gastroenterology

## 2020-12-08 ENCOUNTER — Other Ambulatory Visit: Payer: Self-pay

## 2020-12-08 ENCOUNTER — Encounter: Payer: Self-pay | Admitting: Family Medicine

## 2020-12-08 DIAGNOSIS — J309 Allergic rhinitis, unspecified: Secondary | ICD-10-CM

## 2020-12-08 MED ORDER — AZELASTINE HCL 0.15 % NA SOLN: 1.0000 | Freq: Every day | NASAL | 0 refills | Status: DC | PRN

## 2020-12-24 ENCOUNTER — Ambulatory Visit: Payer: BC Managed Care – PPO | Admitting: Adult Health

## 2020-12-31 ENCOUNTER — Other Ambulatory Visit: Payer: Self-pay | Admitting: Family Medicine

## 2020-12-31 DIAGNOSIS — J309 Allergic rhinitis, unspecified: Secondary | ICD-10-CM

## 2021-01-01 ENCOUNTER — Ambulatory Visit: Payer: BC Managed Care – PPO | Admitting: Cardiovascular Disease

## 2021-01-07 ENCOUNTER — Other Ambulatory Visit: Payer: Self-pay

## 2021-01-07 ENCOUNTER — Encounter: Payer: Self-pay | Admitting: Adult Health

## 2021-01-07 ENCOUNTER — Ambulatory Visit (INDEPENDENT_AMBULATORY_CARE_PROVIDER_SITE_OTHER): Payer: BC Managed Care – PPO | Admitting: Adult Health

## 2021-01-07 VITALS — BP 122/80 | HR 59 | Temp 98.6°F | Ht 70.0 in | Wt 280.4 lb

## 2021-01-07 DIAGNOSIS — Z6837 Body mass index (BMI) 37.0-37.9, adult: Secondary | ICD-10-CM

## 2021-01-07 DIAGNOSIS — G4733 Obstructive sleep apnea (adult) (pediatric): Secondary | ICD-10-CM | POA: Diagnosis not present

## 2021-01-07 NOTE — Assessment & Plan Note (Signed)
Severe obstructive sleep apnea with excellent control compliance on nocturnal CPAP  Plan  Patient Instructions  Keep up the good work Continue on CPAP at bedtime Work on healthy weight Do not drive a sleepy Follow-up in 1 year with Dr. Elsworth Soho and as needed

## 2021-01-07 NOTE — Progress Notes (Signed)
$'@Patient'U$  ID: Steven Sloan, male    DOB: [birth year], 60 y.o.   MRN: [id number]  Chief Complaint  Patient presents with   Follow-up    Referring provider: Wendie Agreste, MD  HPI: 59 year old male followed for obstructive sleep apnea on nocturnal CPAP  TEST/EVENTS :  2007 PSG AHI 40-60/hr   01/07/2021 Follow up ; OSA  Patient returns for a follow-up visit.  Patient has underlying severe sleep apnea.  He is on nocturnal CPAP.  Patient says he is doing very well on CPAP.  Patient says he wears his CPAP every single night.  Never misses any nights.  Says he cannot sleep without it.  Patient feels rested with no significant daytime sleepiness.  CPAP download shows excellent compliance with 100% usage.  Daily average usage at 8.5 hours.  Patient is on auto CPAP 10 to 14 cm H2O.  AHI is 1.4.  Patient says his weight has trended up.  He has been on vacation quite a bit this summer.  He is planning on being more active and eating better.  We discussed healthy weight loss.   No Known Allergies  Immunization History  Administered Date(s) Administered   Influenza,inj,Quad PF,6+ Mos 06/10/2015, 01/22/2016, 01/30/2017, 02/16/2018, 12/26/2018, 01/27/2020   Influenza-Unspecified 01/30/2014   PFIZER Comirnaty(Gray Top)Covid-19 Tri-Sucrose Vaccine 07/31/2020   PFIZER(Purple Top)SARS-COV-2 Vaccination 07/23/2019, 08/13/2019, 02/14/2020   Pneumococcal Polysaccharide-23 04/04/2019   Td 05/02/2006   Tdap 05/31/2016   Zoster Recombinat (Shingrix) 01/11/2019, 03/25/2019    Past Medical History:  Diagnosis Date   Allergy    Seasonal   Chicken pox    Heart murmur    Asymptomatic -- Patient endorses previous negative evaluation   Hypertension    Sleep apnea    CPAP nightly    Tobacco History: Social History   Tobacco Use  Smoking Status Former   Packs/day: 0.25   Years: 25.00   Pack years: 6.25   Types: Cigarettes   Quit date: 05/18/2015   Years since quitting: 5.6  Smokeless  Tobacco Never   Counseling given: Not Answered   Outpatient Medications Prior to Visit  Medication Sig Dispense Refill   aspirin EC 81 MG tablet Take 81 mg by mouth daily.     Azelastine HCl 0.15 % SOLN PLACE 1-2 SPRAYS INTO THE NOSE DAILY AS NEEDED. 90 mL 0   fluticasone (FLONASE) 50 MCG/ACT nasal spray USE 2 SPRAYS IN BOTH  NOSTRILS DAILY 48 g 11   ibuprofen (ADVIL,MOTRIN) 600 MG tablet Take 1 tablet by mouth as needed.  0   lisinopril (ZESTRIL) 10 MG tablet Take 1 tablet (10 mg total) by mouth daily. 90 tablet 3   Multiple Vitamins-Minerals (CENTRUM ADULTS) TABS Take 1 tablet by mouth daily.     Omega-3 Fatty Acids (FISH OIL) 1200 MG CAPS Take 2 capsules by mouth daily.      psyllium (REGULOID) 0.52 g capsule Take 0.52 g by mouth daily.     terazosin (HYTRIN) 1 MG capsule Take 1 capsule (1 mg total) by mouth at bedtime. 90 capsule 3   No facility-administered medications prior to visit.     Review of Systems:   Constitutional:   No  weight loss, night sweats,  Fevers, chills, fatigue, or  lassitude.  HEENT:   No headaches,  Difficulty swallowing,  Tooth/dental problems, or  Sore throat,                No sneezing, itching, ear ache, nasal congestion, post nasal drip,  CV:  No chest pain,  Orthopnea, PND, swelling in lower extremities, anasarca, dizziness, palpitations, syncope.   GI  No heartburn, indigestion, abdominal pain, nausea, vomiting, diarrhea, change in bowel habits, loss of appetite, bloody stools.   Resp: No shortness of breath with exertion or at rest.  No excess mucus, no productive cough,  No non-productive cough,  No coughing up of blood.  No change in color of mucus.  No wheezing.  No chest wall deformity  Skin: no rash or lesions.  GU: no dysuria, change in color of urine, no urgency or frequency.  No flank pain, no hematuria   MS:  No joint pain or swelling.  No decreased range of motion.  No back pain.    Physical Exam  BP 122/80 (BP Location: Left  Arm, Patient Position: Sitting, Cuff Size: Normal)   Pulse (!) 59   Temp 98.6 F (37 C) (Oral)   Ht '5\' 10"'$  (1.778 m)   Wt 280 lb 6.4 oz (127.2 kg)   SpO2 98%   BMI 40.23 kg/m   GEN: A/Ox3; pleasant , NAD, well nourished    HEENT:  [place]/AT,  NOSE-clear, THROAT-clear, no lesions, no postnasal drip or exudate noted.  Class II-III MP airway  NECK:  Supple w/ fair ROM; no JVD; normal carotid impulses w/o bruits; no thyromegaly or nodules palpated; no lymphadenopathy.    RESP  Clear  P & A; w/o, wheezes/ rales/ or rhonchi. no accessory muscle use, no dullness to percussion  CARD:  RRR, no m/r/g, no peripheral edema, pulses intact, no cyanosis or clubbing.  GI:   Soft & nt; nml bowel sounds; no organomegaly or masses detected.   Musco: Warm bil, no deformities or joint swelling noted.   Neuro: alert, no focal deficits noted.    Skin: Warm, no lesions or rashes    Lab Results:    BNP No results found for: BNP  ProBNP No results found for: PROBNP  Imaging: No results found.    No flowsheet data found.  No results found for: NITRICOXIDE      Assessment & Plan:   OSA (obstructive sleep apnea) Severe obstructive sleep apnea with excellent control compliance on nocturnal CPAP  Plan  Patient Instructions  Keep up the good work Continue on CPAP at bedtime Work on healthy weight Do not drive a sleepy Follow-up in 1 year with Dr. Elsworth Soho and as needed     Class 2 severe obesity due to excess calories with serious comorbidity and body mass index (BMI) of 37.0 to 37.9 in adult ([facility]) BMI has increased to 40.  Healthy weight loss discussed     Rexene Edison, NP 01/07/2021

## 2021-01-07 NOTE — Assessment & Plan Note (Addendum)
BMI has increased to 40.  Healthy weight loss discussed

## 2021-01-07 NOTE — Patient Instructions (Signed)
Keep up the good work Continue on CPAP at bedtime Work on healthy weight Do not drive a sleepy Follow-up in 1 year with Dr. Elsworth Soho and as needed

## 2021-01-27 ENCOUNTER — Other Ambulatory Visit: Payer: Self-pay

## 2021-01-27 ENCOUNTER — Telehealth: Payer: Self-pay | Admitting: Gastroenterology

## 2021-01-27 ENCOUNTER — Telehealth: Payer: Self-pay | Admitting: *Deleted

## 2021-01-27 ENCOUNTER — Encounter: Payer: Self-pay | Admitting: Family Medicine

## 2021-01-27 DIAGNOSIS — J309 Allergic rhinitis, unspecified: Secondary | ICD-10-CM

## 2021-01-27 MED ORDER — AZELASTINE HCL 0.15 % NA SOLN: 1.0000 | Freq: Every day | NASAL | 0 refills | Status: DC | PRN

## 2021-01-27 NOTE — Telephone Encounter (Signed)
The pt has an appt with previsit and colon at the correct timing.  Was there something else that I need to do?

## 2021-01-27 NOTE — Telephone Encounter (Signed)
Ok, nothing else needed at this time thank you for confirming I will let pre visit know.

## 2021-01-27 NOTE — Telephone Encounter (Signed)
Dr.Jacobs,  Patient is scheduled for a PV and colon with you on 02/24/21. Please review recall assessment sheet. Last colon 2017. Okay to proceed as scheduled? Please advise. Thank you, Mitsue Peery pv

## 2021-01-27 NOTE — Telephone Encounter (Signed)
Patient is wanting to schedule for his recall however the recall assessment states it should be in 2024 please confirm patients recall year.

## 2021-02-02 NOTE — Telephone Encounter (Signed)
Azestaline nasal spray 0.15% is not covered however 0.10% is covered, if this is okay to change please send Rx as this is considered a new prescription

## 2021-02-03 MED ORDER — AZELASTINE HCL 0.1 % NA SOLN: 1.0000 | Freq: Two times a day (BID) | NASAL | 12 refills | Status: DC

## 2021-02-24 ENCOUNTER — Encounter: Payer: BC Managed Care – PPO | Admitting: Gastroenterology

## 2021-02-25 ENCOUNTER — Other Ambulatory Visit: Payer: Self-pay | Admitting: Family Medicine

## 2021-03-11 ENCOUNTER — Ambulatory Visit (INDEPENDENT_AMBULATORY_CARE_PROVIDER_SITE_OTHER): Payer: BC Managed Care – PPO | Admitting: Cardiovascular Disease

## 2021-03-11 ENCOUNTER — Encounter: Payer: Self-pay | Admitting: Cardiovascular Disease

## 2021-03-11 ENCOUNTER — Other Ambulatory Visit: Payer: Self-pay

## 2021-03-11 VITALS — BP 138/82 | HR 68 | Ht 70.0 in | Wt 283.0 lb

## 2021-03-11 DIAGNOSIS — R011 Cardiac murmur, unspecified: Secondary | ICD-10-CM

## 2021-03-11 DIAGNOSIS — I10 Essential (primary) hypertension: Secondary | ICD-10-CM | POA: Diagnosis not present

## 2021-03-11 DIAGNOSIS — R55 Syncope and collapse: Secondary | ICD-10-CM

## 2021-03-11 NOTE — Patient Instructions (Signed)
Medication Instructions:  No changes *If you need a refill on your cardiac medications before your next appointment, please call your pharmacy*   Lab Work: None ordered If you have labs (blood work) drawn today and your tests are completely normal, you will receive your results only by: [place] (if you have MyChart) OR A paper copy in the mail If you have any lab test that is abnormal or we need to change your treatment, we will call you to review the results.   Testing/Procedures: Your physician has requested that you have an echocardiogram. Echocardiography is a painless test that uses sound waves to create images of your heart. It provides your doctor with information about the size and shape of your heart and how well your heart's chambers and valves are working. You may receive an ultrasound enhancing agent through an IV if needed to better visualize your heart during the echo.This procedure takes approximately one hour. There are no restrictions for this procedure. This will take place at the [address], [address].     Follow-Up: At [facility], you and your health needs are our priority.  As part of our continuing mission to provide you with exceptional heart care, we have created designated Provider Care Teams.  These Care Teams include your primary Cardiologist (physician) and Advanced Practice Providers (APPs -  Physician Assistants and Nurse Practitioners) who all work together to provide you with the care you need, when you need it.  We recommend signing up for the patient portal called "MyChart".  Sign up information is provided on this After Visit Summary.  MyChart is used to connect with patients for Virtual Visits (Telemedicine).  Patients are able to view lab/test results, encounter notes, upcoming appointments, etc.  Non-urgent messages can be sent to your provider as well.   To learn more about what you can do with MyChart, go to [email or web address].     Your next appointment:   12 month(s)  The format for your next appointment:   In Person  Provider:   Sanda Klein, MD

## 2021-03-11 NOTE — Progress Notes (Signed)
Cardiology Office Note:    Date:  03/11/2021   ID:  Steven Sloan, DOB 1960/06/26, MRN [id number]  PCP:  Steven Agreste, MD   [facility] Providers Cardiologist:  Steven Klein, MD     Referring MD: Steven Agreste, MD   Chief Complaint  Patient presents with   Heart Murmur  Steven Sloan is a 60 y.o. male who is being seen today for the evaluation of a cardiac murmur at the request of Steven Agreste, MD.    History of Present Illness:    Steven Sloan is a 60 y.o. male with a hx of treated hypertension and obesity who presents for a murmur that has been increasing in intensity.  He is known about the fact that he has a murmur for at least the last 26 years.  He reports that in the beginning it was barely noticeable but he has been told that is becoming more more prominent.  The murmur was quite obvious when he saw Steven Sloan for his recent physical exam and he is referred in consultation.  He walks his dog 1.4 miles a day on a fairly hilly course at a 2.5-3 mile an hour rate and has been doing this consistently daily for several years.  He does not have any dizziness/presyncope, dyspnea or angina when he does this.  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  He donates blood regularly and did so 2 weeks ago.  He does not have angina or dyspnea following blood donation.  He also denies edema, claudication or any focal neurological complaints, palpitations or syncope.  Occasionally throughout his life he has an occasional woozy like spell that sounds like it is vasovagal, not exertional related.  We discussed the findings on physical exam today and the fact that I suspect that he has aortic stenosis.  When we were discussing the potential treatment for this he became a little woozy, slightly lightheaded and diaphoretic and immediately felt better when he laid flat.  He recovered in less than a couple of  minutes.    Past Medical History:  Diagnosis Date   Allergy    Seasonal   Chicken pox    Heart murmur    Asymptomatic -- Patient endorses previous negative evaluation   Hypertension    Sleep apnea    CPAP nightly    Past Surgical History:  Procedure Laterality Date   COLONOSCOPY     NO PAST SURGERIES     WISDOM TOOTH EXTRACTION      Current Medications: Current Meds  Medication Sig   aspirin EC 81 MG tablet Take 81 mg by mouth daily.   Azelastine HCl 137 MCG/SPRAY SOLN PLACE 1-2 SPRAYS INTO BOTH NOSTRILS 2 (TWO) TIMES DAILY. USE IN EACH NOSTRIL AS DIRECTED   fluticasone (FLONASE) 50 MCG/ACT nasal spray USE 2 SPRAYS IN BOTH  NOSTRILS DAILY   ibuprofen (ADVIL,MOTRIN) 600 MG tablet Take 1 tablet by mouth as needed.   lisinopril (ZESTRIL) 10 MG tablet Take 1 tablet (10 mg total) by mouth daily.   Multiple Vitamins-Minerals (CENTRUM ADULTS) TABS Take 1 tablet by mouth daily.   Omega-3 Fatty Acids (FISH OIL) 1200 MG CAPS Take 2 capsules by mouth daily.    psyllium (REGULOID) 0.52 g capsule Take 0.52 g by mouth daily.   terazosin (HYTRIN) 1 MG capsule Take 1 capsule (1 mg total) by mouth at bedtime.     Allergies:  Patient has no known allergies.   Social History   Socioeconomic History   Marital status: Married    Spouse name: Not on file   Number of children: Not on file   Years of education: Not on file   Highest education level: Not on file  Occupational History   Not on file  Tobacco Use   Smoking status: Former    Packs/day: 0.25    Years: 25.00    Pack years: 6.25    Types: Cigarettes    Quit date: 05/18/2015    Years since quitting: 5.8   Smokeless tobacco: Never  Vaping Use   Vaping Use: Never used  Substance and Sexual Activity   Alcohol use: Yes    Alcohol/week: 2.0 - 4.0 standard drinks    Types: 2 - 4 Standard drinks or equivalent per week    Comment: social   Drug use: No    Types: Marijuana   Sexual activity: Yes  Other Topics Concern   Not  on file  Social History Narrative   Not on file   Social Determinants of Health   Financial Resource Strain: Not on file  Food Insecurity: Not on file  Transportation Needs: Not on file  Physical Activity: Not on file  Stress: Not on file  Social Connections: Not on file     Family History: The patient's family history includes Alcohol abuse in his sister; Cancer (age of onset: 71) in his maternal grandmother; Heart attack (age of onset: 5) in his father and paternal uncle; Heart attack (age of onset: 70) in his paternal grandfather; Heart disease in his father. His paternal uncle needed a "pig valve"  ROS:   Please see the history of present illness.     All other systems reviewed and are negative.  EKGs/Labs/Other Studies Reviewed:    The following studies were reviewed today:   EKG:  EKG is ordered today.  The ekg ordered today demonstrates normal sinus rhythm, left anterior fascicular block, no ischemic repolarization abnormalities  Recent Labs: 11/16/2020: ALT 24; BUN 23; Creatinine, Ser 1.40; Potassium 4.4; Sodium 138  Recent Lipid Panel    Component Value Date/Time   CHOL 183 11/16/2020 1038   TRIG 105.0 11/16/2020 1038   HDL 48.30 11/16/2020 1038   CHOLHDL 4 11/16/2020 1038   VLDL 21.0 11/16/2020 1038   LDLCALC 114 (H) 11/16/2020 1038     Risk Assessment/Calculations:           Physical Exam:    VS:  BP 138/82   Pulse 68   Ht 5\' 10"  (1.778 m)   Wt 283 lb (128.4 kg)   SpO2 98%   BMI 40.61 kg/m     Wt Readings from Last 3 Encounters:  03/11/21 283 lb (128.4 kg)  01/07/21 280 lb 6.4 oz (127.2 kg)  11/16/20 271 lb 9.6 oz (123.2 kg)     GEN: Obese,  Well nourished, well developed in no acute distress HEENT: Normal NECK: No JVD; bilateral carotid bruits that appear to radiate from the chest LYMPHATICS: No lymphadenopathy CARDIAC: Normal first heart sound.  The second heart sound is faint, but distinctly heard.  There is a loud decrease in the  decrescendo systolic murmur that is heard best at the right upper sternal border but radiates broadly towards the apex and even in the interscapular area.  It is particularly loud in the neck/carotid area.  RRR, no diastolic murmurs, rubs, gallops.  I do not detect any radial femoral delay.  He has strong pedal pulses. RESPIRATORY:  Clear to auscultation without rales, wheezing or rhonchi  ABDOMEN: Soft, non-tender, non-distended MUSCULOSKELETAL:  No edema; No deformity  SKIN: Warm and dry NEUROLOGIC:  Alert and oriented x 3 PSYCHIATRIC:  Normal affect   ASSESSMENT:    1. Murmur, cardiac   2. Essential hypertension, benign   3. Morbid obesity ([place])   4. Vasovagal near syncope    PLAN:    In order of problems listed above:  Probable aortic stenosis: )Much less likely that his murmur is related to a VSD, even though it does radiate broadly into his back and is somewhat mid-peaking).  By physical exam this appears to be likely moderate to severe and he most likely has a bicuspid aortic valve (although I cannot hear a distinct systolic click).  We will start with an echocardiogram.  Reviewed the fact that the aortic stenosis treatment can be delayed until he developed symptoms (exertional angina, exertional dyspnea, exertional syncope) but that his future work-up before aortic valve replacement will include a cardiac catheterization to look for coronary stenoses or coronary anomalies and also an evaluation of his aortic root for any evidence of aneurysm formation.  These findings may have an impact on the timing and type of treatment that he receives.  He is quite young, but ultimately I think if the diagnosis of aortic stenosis is confirmed, he is likely to develop symptoms after the age of 84 or so and will be a good candidate for a bioprosthesis, may be followed by a TAVR valve in valve as a second procedure when he is in his 2s.  We will schedule him for the echocardiogram and a follow-up visit  in a year, but he should call us sooner if he develops exertional symptoms.  If the echocardiogram shows that he qualifies for a category of "severe" aortic stenosis, may make his next appointment in 6 months. HTN: Fairly well treated.  Blood pressure is a little high today but was 122/80 a couple of months ago in PCPs office. Obesity: He does not have diabetes mellitus and does not appear to have any of the symptoms attributable to obstructive sleep apnea.  He seems reasonably fit.  Would advise committed efforts at weight loss to prepare him for what is likely to be open heart surgery sometime in the next several years.  Regular aerobic exercise should be continued.  Particularly stressed the importance of continuing his routine neighborhood lab with his dog, since this can serve as an early warning signal for symptoms.  Avoid anaerobic/straining type exercise such as weightlifting. Probable vasovagal presyncope: Fairly typical event today when we were discussing possible need for future heart surgery.  He developed a woozy feeling, diaphoresis, mild dizziness and asked to be laid flat.  He clearly recognize the symptoms as having occurred in the past.  He recovered within 2 minutes of lying horizontally.  He has never had full-blown syncope            Medication Adjustments/Labs and Tests Ordered: Current medicines are reviewed at length with the patient today.  Concerns regarding medicines are outlined above.  Orders Placed This Encounter  Procedures   EKG 12-Lead   ECHOCARDIOGRAM COMPLETE   No orders of the defined types were placed in this encounter.   Patient Instructions  Medication Instructions:  No changes *If you need a refill on your cardiac medications before your next appointment, please call your pharmacy*   Lab Work: None ordered If  you have labs (blood work) drawn today and your tests are completely normal, you will receive your results only by: [place] (if you  have MyChart) OR A paper copy in the mail If you have any lab test that is abnormal or we need to change your treatment, we will call you to review the results.   Testing/Procedures: Your physician has requested that you have an echocardiogram. Echocardiography is a painless test that uses sound waves to create images of your heart. It provides your doctor with information about the size and shape of your heart and how well your heart's chambers and valves are working. You may receive an ultrasound enhancing agent through an IV if needed to better visualize your heart during the echo.This procedure takes approximately one hour. There are no restrictions for this procedure. This will take place at the [address], [address].     Follow-Up: At [facility], you and your health needs are our priority.  As part of our continuing mission to provide you with exceptional heart care, we have created designated Provider Care Teams.  These Care Teams include your primary Cardiologist (physician) and Advanced Practice Providers (APPs -  Physician Assistants and Nurse Practitioners) who all work together to provide you with the care you need, when you need it.  We recommend signing up for the patient portal called "MyChart".  Sign up information is provided on this After Visit Summary.  MyChart is used to connect with patients for Virtual Visits (Telemedicine).  Patients are able to view lab/test results, encounter notes, upcoming appointments, etc.  Non-urgent messages can be sent to your provider as well.   To learn more about what you can do with MyChart, go to [email or web address].    Your next appointment:   12 month(s)  The format for your next appointment:   In Person  Provider:   Sanda Klein, MD   Signed, Steven Klein, MD  03/11/2021 12:30 PM    [place]

## 2021-03-21 ENCOUNTER — Other Ambulatory Visit: Payer: Self-pay | Admitting: Family Medicine

## 2021-04-02 ENCOUNTER — Other Ambulatory Visit: Payer: Self-pay

## 2021-04-02 ENCOUNTER — Ambulatory Visit (HOSPITAL_COMMUNITY): Payer: BC Managed Care – PPO | Attending: Internal Medicine

## 2021-04-02 DIAGNOSIS — R011 Cardiac murmur, unspecified: Secondary | ICD-10-CM | POA: Insufficient documentation

## 2021-04-02 LAB — ECHOCARDIOGRAM COMPLETE
AR max vel: 1.14 cm2
AV Area VTI: 1.21 cm2
AV Area mean vel: 1.15 cm2
AV Mean grad: 40 mmHg
AV Peak grad: 70.6 mmHg
Ao pk vel: 4.2 m/s
Area-P 1/2: 2.76 cm2
P 1/2 time: 825 msec
S' Lateral: 3.4 cm

## 2021-04-05 ENCOUNTER — Other Ambulatory Visit: Payer: Self-pay | Admitting: *Deleted

## 2021-04-05 ENCOUNTER — Encounter: Payer: Self-pay | Admitting: *Deleted

## 2021-05-20 ENCOUNTER — Ambulatory Visit (INDEPENDENT_AMBULATORY_CARE_PROVIDER_SITE_OTHER): Payer: BC Managed Care – PPO | Admitting: Family Medicine

## 2021-05-20 ENCOUNTER — Encounter: Payer: Self-pay | Admitting: Family Medicine

## 2021-05-20 VITALS — BP 128/84 | HR 60 | Temp 98.2°F | Resp 16 | Ht 70.0 in | Wt 265.6 lb

## 2021-05-20 DIAGNOSIS — Z9989 Dependence on other enabling machines and devices: Secondary | ICD-10-CM

## 2021-05-20 DIAGNOSIS — I350 Nonrheumatic aortic (valve) stenosis: Secondary | ICD-10-CM

## 2021-05-20 DIAGNOSIS — Z23 Encounter for immunization: Secondary | ICD-10-CM | POA: Diagnosis not present

## 2021-05-20 DIAGNOSIS — Z0000 Encounter for general adult medical examination without abnormal findings: Secondary | ICD-10-CM

## 2021-05-20 DIAGNOSIS — Z122 Encounter for screening for malignant neoplasm of respiratory organs: Secondary | ICD-10-CM

## 2021-05-20 DIAGNOSIS — E785 Hyperlipidemia, unspecified: Secondary | ICD-10-CM

## 2021-05-20 DIAGNOSIS — G4733 Obstructive sleep apnea (adult) (pediatric): Secondary | ICD-10-CM

## 2021-05-20 DIAGNOSIS — Z125 Encounter for screening for malignant neoplasm of prostate: Secondary | ICD-10-CM

## 2021-05-20 DIAGNOSIS — I10 Essential (primary) hypertension: Secondary | ICD-10-CM

## 2021-05-20 DIAGNOSIS — Z87891 Personal history of nicotine dependence: Secondary | ICD-10-CM

## 2021-05-20 LAB — LIPID PANEL
Cholesterol: 179 mg/dL (ref 0–200)
HDL: 38.9 mg/dL — ABNORMAL LOW
LDL Cholesterol: 115 mg/dL — ABNORMAL HIGH (ref 0–99)
NonHDL: 140.21
Total CHOL/HDL Ratio: 5
Triglycerides: 124 mg/dL (ref 0.0–149.0)
VLDL: 24.8 mg/dL (ref 0.0–40.0)

## 2021-05-20 LAB — COMPREHENSIVE METABOLIC PANEL
ALT: 26 U/L (ref 0–53)
AST: 20 U/L (ref 0–37)
Albumin: 4.7 g/dL (ref 3.5–5.2)
Alkaline Phosphatase: 50 U/L (ref 39–117)
BUN: 24 mg/dL — ABNORMAL HIGH (ref 6–23)
CO2: 26 mEq/L (ref 19–32)
Calcium: 9.6 mg/dL (ref 8.4–10.5)
Chloride: 104 mEq/L (ref 96–112)
Creatinine, Ser: 1.51 mg/dL — ABNORMAL HIGH (ref 0.40–1.50)
GFR: 50.7 mL/min — ABNORMAL LOW (ref >60.00)
Glucose, Bld: 84 mg/dL (ref 70–99)
Potassium: 4.3 mEq/L (ref 3.5–5.1)
Sodium: 138 mEq/L (ref 135–145)
Total Bilirubin: 1.1 mg/dL (ref 0.2–1.2)
Total Protein: 7.6 g/dL (ref 6.0–8.3)

## 2021-05-20 LAB — PSA: PSA: 3.39 ng/mL (ref 0.10–4.00)

## 2021-05-20 LAB — TSH: TSH: 0.76 u[IU]/mL (ref 0.35–5.50)

## 2021-05-20 NOTE — Patient Instructions (Signed)
Sorry to hear about the findings from your echocardiogram, but glad that you are not having any specific symptoms at this time.  I would watch out for new shortness of breath, new fatigue with exertion, feel lightheaded or dizzy or any chest tightness/pressure.  You can also discuss other symptoms with your cardiologist but it appears this would be the main ones to watch out for.  I will check PSA but follow-up with the urology as planned as well as your eye specialist.  Cholesterol was borderline previously but I suspect it to be better with the weight loss.  Congratulations and keep up the good work.  I will also refer you for lung cancer screening.  Let me know if there are questions and thanks for coming in today.  Recheck 6 months.  Preventive Care 38-77 Years Old, Male Preventive care refers to lifestyle choices and visits with your health care provider that can promote health and wellness. Preventive care visits are also called wellness exams. What can I expect for my preventive care visit? Counseling During your preventive care visit, your health care provider may ask about your: Medical history, including: Past medical problems. Family medical history. Current health, including: Emotional well-being. Home life and relationship well-being. Sexual activity. Lifestyle, including: Alcohol, nicotine or tobacco, and drug use. Access to firearms. Diet, exercise, and sleep habits. Safety issues such as seatbelt and bike helmet use. Sunscreen use. Work and work Statistician. Physical exam Your health care provider will check your: Height and weight. These may be used to calculate your BMI (body mass index). BMI is a measurement that tells if you are at a healthy weight. Waist circumference. This measures the distance around your waistline. This measurement also tells if you are at a healthy weight and may help predict your risk of certain diseases, such as type 2 diabetes and high blood  pressure. Heart rate and blood pressure. Body temperature. Skin for abnormal spots. What immunizations do I need? Vaccines are usually given at various ages, according to a schedule. Your health care provider will recommend vaccines for you based on your age, medical history, and lifestyle or other factors, such as travel or where you work. What tests do I need? Screening Your health care provider may recommend screening tests for certain conditions. This may include: Lipid and cholesterol levels. Diabetes screening. This is done by checking your blood sugar (glucose) after you have not eaten for a while (fasting). Hepatitis B test. Hepatitis C test. HIV (human immunodeficiency virus) test. STI (sexually transmitted infection) testing, if you are at risk. Lung cancer screening. Prostate cancer screening. Colorectal cancer screening. Talk with your health care provider about your test results, treatment options, and if necessary, the need for more tests. Follow these instructions at home: Eating and drinking  Eat a diet that includes fresh fruits and vegetables, whole grains, lean protein, and low-fat dairy products. Take vitamin and mineral supplements as recommended by your health care provider. Do not drink alcohol if your health care provider tells you not to drink. If you drink alcohol: Limit how much you have to 0-2 drinks a day. Know how much alcohol is in your drink. In the [place] one drink equals one 12 oz bottle of beer (355 mL), one 5 oz glass of wine (148 mL), or one 1 oz glass of hard liquor (44 mL). Lifestyle Brush your teeth every morning and night with fluoride toothpaste. Floss one time each day. Exercise for at least 30 minutes 5 or  more days each week. Do not use any products that contain nicotine or tobacco. These products include cigarettes, chewing tobacco, and vaping devices, such as e-cigarettes. If you need help quitting, ask your health care provider. Do not  use drugs. If you are sexually active, practice safe sex. Use a condom or other form of protection to prevent STIs. Take aspirin only as told by your health care provider. Make sure that you understand how much to take and what form to take. Work with your health care provider to find out whether it is safe and beneficial for you to take aspirin daily. Find healthy ways to manage stress, such as: Meditation, yoga, or listening to music. Journaling. Talking to a trusted person. Spending time with friends and family. Minimize exposure to UV radiation to reduce your risk of skin cancer. Safety Always wear your seat belt while driving or riding in a vehicle. Do not drive: If you have been drinking alcohol. Do not ride with someone who has been drinking. When you are tired or distracted. While texting. If you have been using any mind-altering substances or drugs. Wear a helmet and other protective equipment during sports activities. If you have firearms in your house, make sure you follow all gun safety procedures. What's next? Go to your health care provider once a year for an annual wellness visit. Ask your health care provider how often you should have your eyes and teeth checked. Stay up to date on all vaccines. This information is not intended to replace advice given to you by your health care provider. Make sure you discuss any questions you have with your health care provider. Document Revised: 10/14/2020 Document Reviewed: 10/14/2020 Elsevier Patient Education  [place].

## 2021-05-20 NOTE — Progress Notes (Signed)
Subjective:  Patient ID: Steven Sloan, male    DOB: [birth year]  Age: 61 y.o. MRN: [id number]  CC:  Chief Complaint  Patient presents with   Annual Exam    Pt here for annual exam, pt notes went to cardiology and will need tricuspid valve replacement but no action required at this time    Immunizations    Pt is due for another pneumonia vaccine per HM     HPI Steven Sloan presents for   Annual physical exam.  Heart murmur: Referred to cardiology last year, seen by Dr. Sallyanne Kuster November 10.Echo December 2.  Severe aortic valve stenosis, bicuspid aortic valve,  EF 60%.  Regular exercise discussed but avoid intense sudden straining like lifting weights.  Plan for monitoring symptoms and if chest tightness, shortness of breath will need to process leading to surgical replacement.  Suspected needed in the next 12 months or so based on velocities across the valve.  Possible coarctation of the aorta but mild.  Unremarkable CTA of the thoracic aorta in December 2016. Denies current CP/dyspnea/near syncope.   Hypertension: Lisinopril 10 mg daily.  Terazosin 1 mg daily (history of slight enlarged prostate, followed by urology). No new side effects. Urinating normally.  Home readings:132/74, 126/66, 122/70.  BP Readings from Last 3 Encounters:  05/20/21 128/84  03/11/21 138/82  01/07/21 122/80   Lab Results  Component Value Date   CREATININE 1.40 11/16/2020   Allergic rhinitis Treated with Flonase, azelastine nasal spray as needed.  OSA on CPAP CPAP nightly, feels well rested, least 8 hours sleep  Obesity Weight has improved since last visit. Portion control, avoiding starches and added sugar.  Body mass index is 38.11 kg/m. Wt Readings from Last 3 Encounters:  05/20/21 265 lb 9.6 oz (120.5 kg)  03/11/21 283 lb (128.4 kg)  01/07/21 280 lb 6.4 oz (127.2 kg)   Lab Results  Component Value Date   HGBA1C 5.3 11/16/2020   Cancer screening: Colonoscopy 09/15/15 - Dr. Ardis Hughs.  Tubular adenoma and hyperplastic polyps. Planned repeat, but advised by GI not due until 2024.  Enlarged prostate in past with elevated PSA, biopsy negative prior. PSA decreased, monitored by urology.requests level today.  No FH of prostate CA.   The natural history of prostate cancer and ongoing controversy regarding screening and potential treatment outcomes of prostate cancer has been discussed with the patient. The meaning of a false positive PSA and a false negative PSA has been discussed. He indicates understanding of the limitations of this screening test and wishes to proceed with screening PSA testing.   Immunization History  Administered Date(s) Administered   Influenza,inj,Quad PF,6+ Mos 06/10/2015, 01/22/2016, 01/30/2017, 02/16/2018, 12/26/2018, 01/27/2020   Influenza-Unspecified 01/30/2014   PFIZER Comirnaty(Gray Top)Covid-19 Tri-Sucrose Vaccine 07/31/2020   PFIZER(Purple Top)SARS-COV-2 Vaccination 07/23/2019, 08/13/2019, 02/14/2020, 02/05/2021   Pneumococcal Polysaccharide-23 04/04/2019   Td 05/02/2006   Tdap 05/31/2016   Zoster Recombinat (Shingrix) 01/11/2019, 03/25/2019  Flu shot last fall.  Had bivalent covid vaccine.  Former smoker - prevnar today   Depression screen [facility] 2/9 05/20/2021 11/16/2020 01/11/2019 08/13/2018 07/22/2016  Decreased Interest 0 0 0 0 0  Down, Depressed, Hopeless 0 0 0 0 0  PHQ - 2 Score 0 0 0 0 0  Altered sleeping - - 0 0 0  Tired, decreased energy - - 0 0 0  Change in appetite - - 0 0 0  Feeling bad or failure about yourself  - - 0 0 0  Trouble concentrating - - 0  0 0  Moving slowly or fidgety/restless - - 0 0 0  Suicidal thoughts - - 0 0 0  PHQ-9 Score - - 0 0 0  Difficult doing work/chores - - Not difficult at all Not difficult at all -   Optho - wears glasses - 1 year ago appt - scheduling.    Dental: Every 3 months with dental.   Alcohol: Less  - few glasses wine on 1-2 days per week.   Quit smoking in 2017 - 25 year hx - 1.5ppd.  would like to consider low dose CT screening.   Exercise - walking 35 min per day most days per week - 5-6 days.   History Patient Active Problem List   Diagnosis Date Noted   Systolic murmur 11/57/2620   Class 2 severe obesity due to excess calories with serious comorbidity and body mass index (BMI) of 37.0 to 37.9 in adult [facility]) 08/13/2018   Prostate cancer screening 06/07/2017   Internal hemorrhoid 06/07/2017   Visit for preventive health examination 07/26/2015   Quit smoking 07/01/2015   OSA (obstructive sleep apnea) 06/10/2015   Essential hypertension, benign 06/10/2015   Past Medical History:  Diagnosis Date   Allergy    Seasonal   Chicken pox    Heart murmur    Asymptomatic -- Patient endorses previous negative evaluation   Hypertension    Sleep apnea    CPAP nightly   Past Surgical History:  Procedure Laterality Date   COLONOSCOPY     NO PAST SURGERIES     WISDOM TOOTH EXTRACTION     No Known Allergies Prior to Admission medications   Medication Sig Start Date End Date Taking? Authorizing Provider  aspirin EC 81 MG tablet Take 81 mg by mouth daily.   Yes [provider]  Azelastine HCl 137 MCG/SPRAY SOLN PLACE 1-2 SPRAYS INTO BOTH NOSTRILS 2 (TWO) TIMES DAILY. USE IN EACH NOSTRIL AS DIRECTED 03/22/21  Yes Wendie Agreste, MD  fluticasone [facility]) 50 MCG/ACT nasal spray USE 2 SPRAYS IN BOTH  NOSTRILS DAILY 11/16/20  Yes Wendie Agreste, MD  ibuprofen (ADVIL,MOTRIN) 600 MG tablet Take 1 tablet by mouth as needed. 04/09/15  Yes [provider]  lisinopril (ZESTRIL) 10 MG tablet Take 1 tablet (10 mg total) by mouth daily. 11/16/20  Yes Wendie Agreste, MD  Multiple Vitamins-Minerals (CENTRUM ADULTS) TABS Take 1 tablet by mouth daily.   Yes [provider]  Omega-3 Fatty Acids (FISH OIL) 1200 MG CAPS Take 2 capsules by mouth daily.    Yes [provider]  psyllium (REGULOID) 0.52 g capsule Take 0.52 g by mouth daily.   Yes  [provider]  terazosin (HYTRIN) 1 MG capsule Take 1 capsule (1 mg total) by mouth at bedtime. 11/16/20  Yes Wendie Agreste, MD   Social History   Socioeconomic History   Marital status: Married    Spouse name: Not on file   Number of children: Not on file   Years of education: Not on file   Highest education level: Not on file  Occupational History   Not on file  Tobacco Use   Smoking status: Former    Packs/day: 0.25    Years: 25.00    Pack years: 6.25    Types: Cigarettes    Quit date: 05/18/2015    Years since quitting: 6.0   Smokeless tobacco: Never  Vaping Use   Vaping Use: Never used  Substance and Sexual Activity   Alcohol  use: Yes    Alcohol/week: 2.0 - 4.0 standard drinks    Types: 2 - 4 Standard drinks or equivalent per week    Comment: social   Drug use: Not Currently    Types: Marijuana   Sexual activity: Yes  Other Topics Concern   Not on file  Social History Narrative   Not on file   Social Determinants of Health   Financial Resource Strain: Not on file  Food Insecurity: Not on file  Transportation Needs: Not on file  Physical Activity: Not on file  Stress: Not on file  Social Connections: Not on file  Intimate Partner Violence: Not on file    Review of Systems  13 point review of systems per patient health survey noted.  Negative other than as indicated above or in HPI.   Objective:   Vitals:   05/20/21 0923  BP: 128/84  Pulse: 60  Resp: 16  Temp: 98.2 F (36.8 C)  TempSrc: Temporal  SpO2: 96%  Weight: 265 lb 9.6 oz (120.5 kg)  Height: 5\' 10"  (1.778 m)     Physical Exam Vitals reviewed.  Constitutional:      Appearance: He is well-developed.  HENT:     Head: Normocephalic and atraumatic.     Right Ear: External ear normal.     Left Ear: External ear normal.  Eyes:     Conjunctiva/sclera: Conjunctivae normal.     Pupils: Pupils are equal, round, and reactive to light.  Neck:     Thyroid: No thyromegaly.      Vascular: No carotid bruit.  Cardiovascular:     Rate and Rhythm: Normal rate and regular rhythm.     Heart sounds: Murmur (0-1/7 systolic.) heard.  Pulmonary:     Effort: Pulmonary effort is normal. No respiratory distress.     Breath sounds: Normal breath sounds. No wheezing.  Abdominal:     General: There is no distension.     Palpations: Abdomen is soft.     Tenderness: There is no abdominal tenderness.  Musculoskeletal:        General: No tenderness. Normal range of motion.     Cervical back: Normal range of motion and neck supple.  Lymphadenopathy:     Cervical: No cervical adenopathy.  Skin:    General: Skin is warm and dry.  Neurological:     Mental Status: He is alert and oriented to person, place, and time.     Deep Tendon Reflexes: Reflexes are normal and symmetric.  Psychiatric:        Behavior: Behavior normal.       Assessment & Plan:  Steven Sloan is a 61 y.o. male . Annual physical exam  - -anticipatory guidance as below in AVS, screening labs above. Health maintenance items as above in HPI discussed/recommended as applicable.   Severe aortic stenosis  -Reiterated symptoms to watch out for based on cardiology visit.  Denies new symptoms at this time.  Likely will need surgery in the next year based on studies above.  Essential hypertension, benign - Plan: Comprehensive metabolic panel, TSH  -Stable, check labs.  No med changes at this time.  OSA on CPAP  -Stable with CPAP, well rested, continue same  Need for pneumococcal vaccination - Plan: Pneumococcal conjugate vaccine 13-valent IM  Screening for lung cancer - Plan: Ambulatory Referral Lung Cancer Screening [place] History of tobacco use - Plan: Ambulatory Referral Lung Cancer Screening [place] [facility]  -Refer for low-dose CT for lung cancer  screening.  Hyperlipidemia, unspecified hyperlipidemia type - Plan: Comprehensive metabolic panel, Lipid panel  -Check labs to discuss med need,  but commended on weight loss and anticipate improved readings.  Screening for malignant neoplasm of prostate - Plan: PSA Check PSA, continue follow-up with urology.  No orders of the defined types were placed in this encounter.  Patient Instructions  Sorry to hear about the findings from your echocardiogram, but glad that you are not having any specific symptoms at this time.  I would watch out for new shortness of breath, new fatigue with exertion, feel lightheaded or dizzy or any chest tightness/pressure.  You can also discuss other symptoms with your cardiologist but it appears this would be the main ones to watch out for.  I will check PSA but follow-up with the urology as planned as well as your eye specialist.  Cholesterol was borderline previously but I suspect it to be better with the weight loss.  Congratulations and keep up the good work.  I will also refer you for lung cancer screening.  Let me know if there are questions and thanks for coming in today.  Recheck 6 months.  Preventive Care 2-69 Years Old, Male Preventive care refers to lifestyle choices and visits with your health care provider that can promote health and wellness. Preventive care visits are also called wellness exams. What can I expect for my preventive care visit? Counseling During your preventive care visit, your health care provider may ask about your: Medical history, including: Past medical problems. Family medical history. Current health, including: Emotional well-being. Home life and relationship well-being. Sexual activity. Lifestyle, including: Alcohol, nicotine or tobacco, and drug use. Access to firearms. Diet, exercise, and sleep habits. Safety issues such as seatbelt and bike helmet use. Sunscreen use. Work and work Statistician. Physical exam Your health care provider will check your: Height and weight. These may be used to calculate your BMI (body mass index). BMI is a measurement that tells  if you are at a healthy weight. Waist circumference. This measures the distance around your waistline. This measurement also tells if you are at a healthy weight and may help predict your risk of certain diseases, such as type 2 diabetes and high blood pressure. Heart rate and blood pressure. Body temperature. Skin for abnormal spots. What immunizations do I need? Vaccines are usually given at various ages, according to a schedule. Your health care provider will recommend vaccines for you based on your age, medical history, and lifestyle or other factors, such as travel or where you work. What tests do I need? Screening Your health care provider may recommend screening tests for certain conditions. This may include: Lipid and cholesterol levels. Diabetes screening. This is done by checking your blood sugar (glucose) after you have not eaten for a while (fasting). Hepatitis B test. Hepatitis C test. HIV (human immunodeficiency virus) test. STI (sexually transmitted infection) testing, if you are at risk. Lung cancer screening. Prostate cancer screening. Colorectal cancer screening. Talk with your health care provider about your test results, treatment options, and if necessary, the need for more tests. Follow these instructions at home: Eating and drinking  Eat a diet that includes fresh fruits and vegetables, whole grains, lean protein, and low-fat dairy products. Take vitamin and mineral supplements as recommended by your health care provider. Do not drink alcohol if your health care provider tells you not to drink. If you drink alcohol: Limit how much you have to 0-2 drinks a day. Know how  much alcohol is in your drink. In the [place] one drink equals one 12 oz bottle of beer (355 mL), one 5 oz glass of wine (148 mL), or one 1 oz glass of hard liquor (44 mL). Lifestyle Brush your teeth every morning and night with fluoride toothpaste. Floss one time each day. Exercise for at least 30  minutes 5 or more days each week. Do not use any products that contain nicotine or tobacco. These products include cigarettes, chewing tobacco, and vaping devices, such as e-cigarettes. If you need help quitting, ask your health care provider. Do not use drugs. If you are sexually active, practice safe sex. Use a condom or other form of protection to prevent STIs. Take aspirin only as told by your health care provider. Make sure that you understand how much to take and what form to take. Work with your health care provider to find out whether it is safe and beneficial for you to take aspirin daily. Find healthy ways to manage stress, such as: Meditation, yoga, or listening to music. Journaling. Talking to a trusted person. Spending time with friends and family. Minimize exposure to UV radiation to reduce your risk of skin cancer. Safety Always wear your seat belt while driving or riding in a vehicle. Do not drive: If you have been drinking alcohol. Do not ride with someone who has been drinking. When you are tired or distracted. While texting. If you have been using any mind-altering substances or drugs. Wear a helmet and other protective equipment during sports activities. If you have firearms in your house, make sure you follow all gun safety procedures. What's next? Go to your health care provider once a year for an annual wellness visit. Ask your health care provider how often you should have your eyes and teeth checked. Stay up to date on all vaccines. This information is not intended to replace advice given to you by your health care provider. Make sure you discuss any questions you have with your health care provider. Document Revised: 10/14/2020 Document Reviewed: 10/14/2020 Elsevier Patient Education  2022 [place],   Merri Ray, MD [place] 05/20/21 10:18 AM

## 2021-06-01 ENCOUNTER — Other Ambulatory Visit: Payer: Self-pay | Admitting: Family Medicine

## 2021-06-01 DIAGNOSIS — R7989 Other specified abnormal findings of blood chemistry: Secondary | ICD-10-CM

## 2021-06-01 NOTE — Progress Notes (Signed)
See labs, recheck creatinine.

## 2021-06-18 ENCOUNTER — Other Ambulatory Visit: Payer: BC Managed Care – PPO

## 2021-06-21 ENCOUNTER — Other Ambulatory Visit (INDEPENDENT_AMBULATORY_CARE_PROVIDER_SITE_OTHER): Payer: BC Managed Care – PPO

## 2021-06-21 DIAGNOSIS — R7989 Other specified abnormal findings of blood chemistry: Secondary | ICD-10-CM | POA: Diagnosis not present

## 2021-06-21 LAB — BASIC METABOLIC PANEL
BUN: 26 mg/dL — ABNORMAL HIGH (ref 6–23)
CO2: 29 mEq/L (ref 19–32)
Calcium: 9.5 mg/dL (ref 8.4–10.5)
Chloride: 104 mEq/L (ref 96–112)
Creatinine, Ser: 1.4 mg/dL (ref 0.40–1.50)
GFR: 55.49 mL/min — ABNORMAL LOW (ref >60.00)
Glucose, Bld: 90 mg/dL (ref 70–99)
Potassium: 4.1 mEq/L (ref 3.5–5.1)
Sodium: 138 mEq/L (ref 135–145)

## 2021-06-28 ENCOUNTER — Ambulatory Visit: Payer: BC Managed Care – PPO | Admitting: Family Medicine

## 2021-07-02 ENCOUNTER — Encounter: Payer: Self-pay | Admitting: Registered Nurse

## 2021-07-02 ENCOUNTER — Ambulatory Visit (INDEPENDENT_AMBULATORY_CARE_PROVIDER_SITE_OTHER): Payer: BC Managed Care – PPO | Admitting: Registered Nurse

## 2021-07-02 VITALS — BP 136/72 | HR 64 | Temp 97.8°F | Resp 18 | Ht 70.0 in | Wt 250.8 lb

## 2021-07-02 DIAGNOSIS — B351 Tinea unguium: Secondary | ICD-10-CM | POA: Diagnosis not present

## 2021-07-02 MED ORDER — CICLOPIROX 0.77 % EX GEL: 1.0000 "application " | Freq: Two times a day (BID) | CUTANEOUS | 0 refills | Status: DC

## 2021-07-02 MED ORDER — ITRACONAZOLE 100 MG PO CAPS: 100.0000 mg | ORAL_CAPSULE | Freq: Every day | ORAL | 0 refills | Status: DC

## 2021-07-02 NOTE — Patient Instructions (Addendum)
Mr. Vanderzee -  ? ?Great to meet you ? ?Oral itraconazole, topical ciclopirox ? ?Course for 12 weeks. ? ?Call if worsening or failing to improve ? ?Thanks, ? ?Rich  ? ? ? ?If you have lab work done today you will be contacted with your lab results within the next 2 weeks.  If you have not heard from [place] then please contact us. The fastest way to get your results is to register for My Chart. ? ? ?IF you received an x-ray today, you will receive an invoice from [facility]. Please contact [facility] at [phone] with questions or concerns regarding your invoice.  ? ?IF you received labwork today, you will receive an invoice from [facility]. Please contact [facility] at [phone] with questions or concerns regarding your invoice.  ? ?Our billing staff will not be able to assist you with questions regarding bills from these companies. ? ?You will be contacted with the lab results as soon as they are available. The fastest way to get your results is to activate your My Chart account. Instructions are located on the last page of this paperwork. If you have not heard from [place] regarding the results in 2 weeks, please contact this office. ?  ? ? ?

## 2021-07-02 NOTE — Progress Notes (Signed)
? ?Established Patient Office Visit ? ?Subjective:  ?Patient ID: Steven Sloan, male    DOB: 04-09-1961  Age: 61 y.o. MRN: [id number] ? ?CC:  ?Chief Complaint  ?Patient presents with  ? Nail Problem  ?  Patient states he has some big toe fungus on both  feet but mainly the left foot since December. Patient has had this issue in the past. Patient also has a couple questions about Covid Boosters  ? ? ?HPI ?Steven Sloan presents for onychomycosis  ? ?Both great toes. ?Onset a few weeks ago ?Has used OTCs in the past, no relief ? ?Has used po terbinafine and itraconazole with good effect. Has used topical ciclopirox with good effect ? ?No other concerns at this time.  ? ?Past Medical History:  ?Diagnosis Date  ? Allergy   ? Seasonal  ? Chicken pox   ? Heart murmur   ? Asymptomatic -- Patient endorses previous negative evaluation  ? Hypertension   ? Sleep apnea   ? CPAP nightly  ? ? ?Past Surgical History:  ?Procedure Laterality Date  ? COLONOSCOPY    ? NO PAST SURGERIES    ? WISDOM TOOTH EXTRACTION    ? ? ?Family History  ?Problem Relation Age of Onset  ? Heart attack Father 28  ? Heart disease Father   ? Alcohol abuse Sister   ? Heart attack Paternal Uncle 5  ? Heart attack Paternal Grandfather 39  ? Cancer Maternal Grandmother 29  ?     Unsure  ? ? ?Social History  ? ?Socioeconomic History  ? Marital status: Married  ?  Spouse name: Not on file  ? Number of children: Not on file  ? Years of education: Not on file  ? Highest education level: Not on file  ?Occupational History  ? Not on file  ?Tobacco Use  ? Smoking status: Former  ?  Packs/day: 0.25  ?  Years: 25.00  ?  Pack years: 6.25  ?  Types: Cigarettes  ?  Quit date: 05/18/2015  ?  Years since quitting: 6.1  ? Smokeless tobacco: Never  ?Vaping Use  ? Vaping Use: Never used  ?Substance and Sexual Activity  ? Alcohol use: Yes  ?  Alcohol/week: 2.0 - 4.0 standard drinks  ?  Types: 2 - 4 Standard drinks or equivalent per week  ?  Comment: social  ? Drug use: Not  Currently  ?  Types: Marijuana  ? Sexual activity: Yes  ?Other Topics Concern  ? Not on file  ?Social History Narrative  ? Not on file  ? ?Social Determinants of Health  ? ?Financial Resource Strain: Not on file  ?Food Insecurity: Not on file  ?Transportation Needs: Not on file  ?Physical Activity: Not on file  ?Stress: Not on file  ?Social Connections: Not on file  ?Intimate Partner Violence: Not on file  ? ? ?Outpatient Medications Prior to Visit  ?Medication Sig Dispense Refill  ? aspirin EC 81 MG tablet Take 81 mg by mouth daily.    ? Azelastine HCl 137 MCG/SPRAY SOLN PLACE 1-2 SPRAYS INTO BOTH NOSTRILS 2 (TWO) TIMES DAILY. USE IN EACH NOSTRIL AS DIRECTED 90 mL 2  ? fluticasone (FLONASE) 50 MCG/ACT nasal spray USE 2 SPRAYS IN BOTH  NOSTRILS DAILY 48 g 11  ? ibuprofen (ADVIL,MOTRIN) 600 MG tablet Take 1 tablet by mouth as needed.  0  ? lisinopril (ZESTRIL) 10 MG tablet Take 1 tablet (10 mg total) by mouth daily. 90 tablet 3  ?  Multiple Vitamins-Minerals (CENTRUM ADULTS) TABS Take 1 tablet by mouth daily.    ? Omega-3 Fatty Acids (FISH OIL) 1200 MG CAPS Take 2 capsules by mouth daily.     ? psyllium (REGULOID) 0.52 g capsule Take 0.52 g by mouth daily.    ? terazosin (HYTRIN) 1 MG capsule Take 1 capsule (1 mg total) by mouth at bedtime. 90 capsule 3  ? ?No facility-administered medications prior to visit.  ? ? ?No Known Allergies ? ?ROS ?Review of Systems  ?Constitutional: Negative.   ?HENT: Negative.    ?Eyes: Negative.   ?Respiratory: Negative.    ?Cardiovascular: Negative.   ?Gastrointestinal: Negative.   ?Genitourinary: Negative.   ?Musculoskeletal: Negative.   ?Skin: Negative.   ?Neurological: Negative.   ?Psychiatric/Behavioral: Negative.    ?All other systems reviewed and are negative. ? ?  ?Objective:  ?  ?Physical Exam ?Constitutional:   ?   General: He is not in acute distress. ?   Appearance: Normal appearance. He is normal weight. He is not ill-appearing, toxic-appearing or diaphoretic.   ?Cardiovascular:  ?   Rate and Rhythm: Normal rate and regular rhythm.  ?   Heart sounds: Normal heart sounds. No murmur heard. ?  No friction rub. No gallop.  ?Pulmonary:  ?   Effort: Pulmonary effort is normal. No respiratory distress.  ?   Breath sounds: Normal breath sounds. No stridor. No wheezing, rhonchi or rales.  ?Chest:  ?   Chest wall: No tenderness.  ?Skin: ?   General: Skin is warm and dry.  ?   Comments: Onychomycosis of both great toes  ?Neurological:  ?   General: No focal deficit present.  ?   Mental Status: He is alert and oriented to person, place, and time. Mental status is at baseline.  ?Psychiatric:     ?   Mood and Affect: Mood normal.     ?   Behavior: Behavior normal.     ?   Thought Content: Thought content normal.     ?   Judgment: Judgment normal.  ? ? ?BP 136/72   Pulse 64   Temp 97.8 ?F (36.6 ?C) (Temporal)   Resp 18   Ht 5\' 10"  (1.778 m)   Wt 250 lb 12.8 oz (113.8 kg)   SpO2 98%   BMI 35.99 kg/m?  ?Wt Readings from Last 3 Encounters:  ?07/02/21 250 lb 12.8 oz (113.8 kg)  ?05/20/21 265 lb 9.6 oz (120.5 kg)  ?03/11/21 283 lb (128.4 kg)  ? ? ? ?There are no preventive care reminders to display for this patient. ? ?There are no preventive care reminders to display for this patient. ? ?Lab Results  ?Component Value Date  ? TSH 0.76 05/20/2021  ? ?Lab Results  ?Component Value Date  ? WBC 7.2 01/11/2019  ? HGB 15.5 01/11/2019  ? HCT 45.3 01/11/2019  ? MCV 90.7 01/11/2019  ? PLT 208.0 01/11/2019  ? ?Lab Results  ?Component Value Date  ? NA 138 06/21/2021  ? K 4.1 06/21/2021  ? CO2 29 06/21/2021  ? GLUCOSE 90 06/21/2021  ? BUN 26 (H) 06/21/2021  ? CREATININE 1.40 06/21/2021  ? BILITOT 1.1 05/20/2021  ? ALKPHOS 50 05/20/2021  ? AST 20 05/20/2021  ? ALT 26 05/20/2021  ? PROT 7.6 05/20/2021  ? ALBUMIN 4.7 05/20/2021  ? CALCIUM 9.5 06/21/2021  ? ANIONGAP 9 04/09/2015  ? GFR 55.49 (L) 06/21/2021  ? ?Lab Results  ?Component Value Date  ? CHOL 179 05/20/2021  ? ?  Lab Results  ?Component Value  Date  ? HDL 38.90 (L) 05/20/2021  ? ?Lab Results  ?Component Value Date  ? LDLCALC 115 (H) 05/20/2021  ? ?Lab Results  ?Component Value Date  ? TRIG 124.0 05/20/2021  ? ?Lab Results  ?Component Value Date  ? CHOLHDL 5 05/20/2021  ? ?Lab Results  ?Component Value Date  ? HGBA1C 5.3 11/16/2020  ? ? ?  ?Assessment & Plan:  ? ?Problem List Items Addressed This Visit   ?None ?Visit Diagnoses   ? ? Onychomycosis    -  Primary  ? Relevant Medications  ? Ciclopirox 0.77 % gel  ? itraconazole (SPORANOX) 100 MG capsule  ? ?  ? ? ?Meds ordered this encounter  ?Medications  ? Ciclopirox 0.77 % gel  ?  Sig: Apply 1 application topically 2 (two) times daily.  ?  Dispense:  100 g  ?  Refill:  0  ?  Order Specific Question:   Supervising Provider  ?  Answer:   Carlota Raspberry, JEFFREY R [[id number]]  ? itraconazole (SPORANOX) 100 MG capsule  ?  Sig: Take 1 capsule (100 mg total) by mouth daily.  ?  Dispense:  84 capsule  ?  Refill:  0  ?  Order Specific Question:   Supervising Provider  ?  Answer:   Carlota Raspberry, JEFFREY R [[id number]]  ? ? ?Follow-up: No follow-ups on file.  ? ?PLAN ?Po itraconazole and topical ciclopirox ?Return if worsening or failing to improve ?Recent LFTs reviewed wnl ?Patient encouraged to call clinic with any questions, comments, or concerns. ? ?Maximiano Coss, NP ?

## 2021-07-13 ENCOUNTER — Other Ambulatory Visit: Payer: Self-pay

## 2021-07-13 DIAGNOSIS — Z87891 Personal history of nicotine dependence: Secondary | ICD-10-CM

## 2021-08-03 ENCOUNTER — Encounter: Payer: BC Managed Care – PPO | Admitting: Acute Care

## 2021-08-04 ENCOUNTER — Ambulatory Visit (HOSPITAL_BASED_OUTPATIENT_CLINIC_OR_DEPARTMENT_OTHER): Payer: BC Managed Care – PPO

## 2021-08-16 NOTE — Progress Notes (Signed)
Virtual Visit via Telephone Note ? ?I connected with Steven Sloan on 08/16/21 at  3:00 PM EDT by telephone and verified that I am speaking with the correct person using two identifiers. ? ?Location: ?Patient:  At home ?Provider: [place], [place], [place], [address]  ?  ?I discussed the limitations, risks, security and privacy concerns of performing an evaluation and management service by telephone and the availability of in person appointments. I also discussed with the patient that there may be a patient responsible charge related to this service. The patient expressed understanding and agreed to proceed. ? ? ?Shared Decision Making Visit Lung Cancer Screening Program ?([id number]) ? ? ?Eligibility: ?Age 61 y.o. ?Pack Years Smoking History Calculation 63 pack year smoking history ?(# packs/per year x # years smoked) ?Recent History of coughing up blood  no ?Unexplained weight loss? no ?( >Than 15 pounds within the last 6 months ) ?Prior History Lung / other cancer no ?(Diagnosis within the last 5 years already requiring surveillance chest CT Scans). ?Smoking Status Former Smoker ?Former Smokers: Years since quit: 6 years ? Quit Date: 2017 ? ?Visit Components: ?Discussion included one or more decision making aids. yes ?Discussion included risk/benefits of screening. yes ?Discussion included potential follow up diagnostic testing for abnormal scans. yes ?Discussion included meaning and risk of over diagnosis. yes ?Discussion included meaning and risk of False Positives. yes ?Discussion included meaning of total radiation exposure. yes ? ?Counseling Included: ?Importance of adherence to annual lung cancer LDCT screening. yes ?Impact of comorbidities on ability to participate in the program. yes ?Ability and willingness to under diagnostic treatment. yes ? ?Smoking Cessation Counseling: ?Current Smokers:  ?Discussed importance of smoking cessation. yes ?Information about tobacco cessation classes and  interventions provided to patient. yes ?Patient provided with "ticket" for LDCT Scan. yes ?Symptomatic Patient. no ? Counseling NA ?Diagnosis Code: Tobacco Use Z72.0 ?Asymptomatic Patient yes ? Counseling (Intermediate counseling: > three minutes counseling) R6789 ?Former Smokers:  ?Discussed the importance of maintaining cigarette abstinence. yes ?Diagnosis Code: Personal History of Nicotine Dependence. F81.017 ?Information about tobacco cessation classes and interventions provided to patient. Yes ?Patient provided with "ticket" for LDCT Scan. yes ?Written Order for Lung Cancer Screening with LDCT placed in Epic. Yes ?(CT Chest Lung Cancer Screening Low Dose W/O CM) [id number] ?Z12.2-Screening of respiratory organs ? ? ?I spent 25 minutes of face to face time/virtual visit time  with   Steven Sloan discussing the risks and benefits of lung cancer screening. We took the time to pause the power point at intervals to allow for questions to be asked and answered to ensure understanding. We discussed that he had taken the single most powerful action possible to decrease his risk of developing lung cancer when he quit smoking. I counseled him to remain smoke free, and to contact me if he ever had the desire to smoke again so that I can provide resources and tools to help support the effort to remain smoke free. We discussed the time and location of the scan, and that either  [name] RN, [name], RN or I  or I will call / send a letter with the results within  24-72 hours of receiving them. He has the office contact information in the event he needs to speak with me,  He verbalized understanding of all of the above and had no further questions upon leaving the office.  ? ? ? ?I explained to the patient that there has been a high incidence  of coronary artery disease noted on these exams. I explained that this is a non-gated exam therefore degree or severity cannot be determined. This patient is not on statin  therapy. I have asked the patient to follow-up with their PCP regarding any incidental finding of coronary artery disease and management with diet or medication as they feel is clinically indicated. The patient verbalized understanding of the above and had no further questions. ? ?Magdalen Spatz, MSN, AGACNP-BC ?[facility] ?See Amion for personal pager ?PCCM on call pager [phone]  ?08/17/2021 ? ?  ?

## 2021-08-17 ENCOUNTER — Ambulatory Visit (INDEPENDENT_AMBULATORY_CARE_PROVIDER_SITE_OTHER): Payer: BC Managed Care – PPO | Admitting: Acute Care

## 2021-08-17 ENCOUNTER — Encounter: Payer: Self-pay | Admitting: Acute Care

## 2021-08-17 DIAGNOSIS — Z87891 Personal history of nicotine dependence: Secondary | ICD-10-CM

## 2021-08-17 NOTE — Patient Instructions (Signed)
Thank you for participating in the [facility] Scre[facility]. It was our pleasure to meet you today. We will call you with the results of your scan within the next few days. Your scan will be assigned a Lung RADS category score by the physicians reading the scans.  This Lung RADS score determines follow up scanning.  See below for description of categories, and follow up screening recommendations. We will be in touch to schedule your follow up screening annually or based on recommendations of our providers. We will fax a copy of your scan results to your Primary Care Physician, or the physician who referred you to the program, to ensure they have the results. Please call the office if you have any questions or concerns regarding your scanning experience or results.  Our office number is [phone]. Please speak with Denise Phelps, RN. , or  Denise Buckner RN, They are  our Lung Cancer Screening RN.'s If They are unavailable when you call, Please leave a message on the voice mail. We will return your call at our earliest convenience.This voice mail is monitored several times a day.  Remember, if your scan is normal, we will scan you annually as long as you continue to meet the criteria for the program. (Age 55-77, Current smoker or smoker who has quit within the last 15 years). If you are a smoker, remember, quitting is the single most powerful action that you can take to decrease your risk of lung cancer and other pulmonary, breathing related problems. We know quitting is hard, and we are here to help.  Please let us know if there is anything we can do to help you meet your goal of quitting. If you are a former smoker, congratulations. We are proud of you! Remain smoke free! Remember you can refer friends or family members through the number above.  We will screen them to make sure they meet criteria for the program. Thank you for helping us take better care of you by  participating in Lung Screening.  You can receive free nicotine replacement therapy ( patches, gum or mints) by calling 1-800-QUIT NOW. Please call so we can get you on the path to becoming  a non-smoker. I know it is hard, but you can do this!  Lung RADS Categories:  Lung RADS 1: no nodules or definitely non-concerning nodules.  Recommendation is for a repeat annual scan in 12 months.  Lung RADS 2:  nodules that are non-concerning in appearance and behavior with a very low likelihood of becoming an active cancer. Recommendation is for a repeat annual scan in 12 months.  Lung RADS 3: nodules that are probably non-concerning , includes nodules with a low likelihood of becoming an active cancer.  Recommendation is for a 6-month repeat screening scan. Often noted after an upper respiratory illness. We will be in touch to make sure you have no questions, and to schedule your 6-month scan.  Lung RADS 4 A: nodules with concerning findings, recommendation is most often for a follow up scan in 3 months or additional testing based on our provider's assessment of the scan. We will be in touch to make sure you have no questions and to schedule the recommended 3 month follow up scan.  Lung RADS 4 B:  indicates findings that are concerning. We will be in touch with you to schedule additional diagnostic testing based on our provider's  assessment of the scan.  Other options for assistance in smoking cessation (   As covered by your insurance benefits)  Hypnosis for smoking cessation  [employer] [phone]  Acupuncture for smoking cessation  [facility] [phone]   

## 2021-08-18 ENCOUNTER — Ambulatory Visit (HOSPITAL_BASED_OUTPATIENT_CLINIC_OR_DEPARTMENT_OTHER)
Admission: RE | Admit: 2021-08-18 | Discharge: 2021-08-18 | Disposition: A | Discharge status: Home / Self Care | Payer: BC Managed Care – PPO | Source: Ambulatory Visit | Attending: Acute Care | Admitting: Acute Care

## 2021-08-18 DIAGNOSIS — Z87891 Personal history of nicotine dependence: Secondary | ICD-10-CM | POA: Diagnosis present

## 2021-08-20 ENCOUNTER — Other Ambulatory Visit: Payer: Self-pay | Admitting: Acute Care

## 2021-08-20 DIAGNOSIS — Z122 Encounter for screening for malignant neoplasm of respiratory organs: Secondary | ICD-10-CM

## 2021-08-20 DIAGNOSIS — Z87891 Personal history of nicotine dependence: Secondary | ICD-10-CM

## 2021-10-18 ENCOUNTER — Other Ambulatory Visit: Payer: Self-pay | Admitting: Family Medicine

## 2021-10-18 DIAGNOSIS — I10 Essential (primary) hypertension: Secondary | ICD-10-CM

## 2021-11-25 ENCOUNTER — Ambulatory Visit (INDEPENDENT_AMBULATORY_CARE_PROVIDER_SITE_OTHER): Payer: BC Managed Care – PPO | Admitting: Family Medicine

## 2021-11-25 VITALS — BP 126/74 | HR 60 | Temp 98.6°F | Resp 16 | Ht 70.0 in | Wt 236.0 lb

## 2021-11-25 DIAGNOSIS — R7989 Other specified abnormal findings of blood chemistry: Secondary | ICD-10-CM

## 2021-11-25 DIAGNOSIS — I700 Atherosclerosis of aorta: Secondary | ICD-10-CM | POA: Diagnosis not present

## 2021-11-25 DIAGNOSIS — I350 Nonrheumatic aortic (valve) stenosis: Secondary | ICD-10-CM

## 2021-11-25 DIAGNOSIS — B351 Tinea unguium: Secondary | ICD-10-CM

## 2021-11-25 DIAGNOSIS — E785 Hyperlipidemia, unspecified: Secondary | ICD-10-CM

## 2021-11-25 DIAGNOSIS — I10 Essential (primary) hypertension: Secondary | ICD-10-CM | POA: Diagnosis not present

## 2021-11-25 LAB — LIPID PANEL
Cholesterol: 185 mg/dL (ref 0–200)
HDL: 52 mg/dL (ref >39.00)
LDL Cholesterol: 111 mg/dL — ABNORMAL HIGH (ref 0–99)
NonHDL: 133.43
Total CHOL/HDL Ratio: 4
Triglycerides: 110 mg/dL (ref 0.0–149.0)
VLDL: 22 mg/dL (ref 0.0–40.0)

## 2021-11-25 LAB — COMPREHENSIVE METABOLIC PANEL
ALT: 22 U/L (ref 0–53)
AST: 20 U/L (ref 0–37)
Albumin: 4.8 g/dL (ref 3.5–5.2)
Alkaline Phosphatase: 54 U/L (ref 39–117)
BUN: 26 mg/dL — ABNORMAL HIGH (ref 6–23)
CO2: 29 mEq/L (ref 19–32)
Calcium: 10 mg/dL (ref 8.4–10.5)
Chloride: 103 mEq/L (ref 96–112)
Creatinine, Ser: 1.34 mg/dL (ref 0.40–1.50)
GFR: 58.3 mL/min — ABNORMAL LOW (ref >60.00)
Glucose, Bld: 101 mg/dL — ABNORMAL HIGH (ref 70–99)
Potassium: 4.5 mEq/L (ref 3.5–5.1)
Sodium: 138 mEq/L (ref 135–145)
Total Bilirubin: 1.3 mg/dL — ABNORMAL HIGH (ref 0.2–1.2)
Total Protein: 8.2 g/dL (ref 6.0–8.3)

## 2021-11-25 MED ORDER — CICLOPIROX 0.77 % EX GEL: 1.0000 "application " | Freq: Two times a day (BID) | CUTANEOUS | 0 refills | Status: DC

## 2021-11-25 MED ORDER — TERBINAFINE HCL 250 MG PO TABS: 250.0000 mg | ORAL_TABLET | Freq: Every day | ORAL | 0 refills | Status: DC

## 2021-11-25 MED ORDER — ATORVASTATIN CALCIUM 10 MG PO TABS
10.0000 mg | ORAL_TABLET | Freq: Every day | ORAL | 0 refills | Status: DC
Start: 2021-11-25 — End: 2022-01-17

## 2021-11-25 NOTE — Patient Instructions (Addendum)
I do recommend starting lipitor once per day based on some of the findings from your low-dose CAT scan of the chest.  Glad there were no concerning findings on that study.  Recheck in 6 weeks to evaluate cholesterol levels and liver test at that time.  Kidney function test appears to be slightly low over the past few years but overall stable.  I will refer you to a nephrologist to decide if other testing needed but for now make sure to stay hydrated and avoid NSAIDs like Advil if possible.  Blood pressure looks okay, no med changes.  We can restart treatments for toenail fungus.  It may just take a little bit more time to treat.  Recheck in 6 weeks.  Thanks for coming in today, have a great vacation!

## 2021-11-25 NOTE — Progress Notes (Signed)
Subjective:  Patient ID: Steven Sloan, male    DOB: Sep 27, 1960  Age: 61 y.o. MRN: [id number]  CC:  Chief Complaint  Patient presents with   Hyperlipidemia    Fasting for labs    Hypertension    HPI Steven Sloan presents for   Hypertension: With use of CPAP for OSA.  Lisinopril 10 mg daily.  Also on terazosin 1 mg for enlarged prostate, followed by urology. Has been seen by cardiology for heart murmur, severe aortic valve stenosis.  Plan for continued monitoring but if chest tightness or dyspnea for proceeding with surgical replacement.  Possible coarctation of the aorta but mild.  Cardiology Steven Sloan.  Creatinine was elevated at 1.51 in January, improved to 1.40 in February with EGFR 55. Home readings: 123/66 R arm, left arm 106/65  stable with intermittent screening  Walking for exercise 2 miles, no CP/dyspnea.  Well rested with use of cpap.  Advil few times per week.  Fasting today.  BP Readings from Last 3 Encounters:  11/25/21 126/74  07/02/21 136/72  05/20/21 128/84   Lab Results  Component Value Date   CREATININE 1.40 06/21/2021   Hyperlipidemia: Fasting today. Fish oil, no statins. Aortic atherosclerosis and coronary artery calcification on CT for lung CA screening on 4/19. Agreeable to starting statin.  Lab Results  Component Value Date   CHOL 179 05/20/2021   HDL 38.90 (L) 05/20/2021   LDLCALC 115 (H) 05/20/2021   TRIG 124.0 05/20/2021   CHOLHDL 5 05/20/2021   Lab Results  Component Value Date   ALT 26 05/20/2021   AST 20 05/20/2021   ALKPHOS 50 05/20/2021   BILITOT 1.1 05/20/2021   Onychomycosis: Seen in March by my colleague.  Treated with oral itraconazole and topical ciclopirox. Seemed to improve on meds, but coming back off meds- about a month ago, then out of gel past week. Great toenails with thickened discolored nails. Has tried to trim nail, some separation. No side effects on meds.    History Patient Active Problem List   Diagnosis  Date Noted   Systolic murmur 56/43/3295   Class 2 severe obesity due to excess calories with serious comorbidity and body mass index (BMI) of 37.0 to 37.9 in adult [facility]) 08/13/2018   Prostate cancer screening 06/07/2017   Internal hemorrhoid 06/07/2017   Visit for preventive health examination 07/26/2015   Quit smoking 07/01/2015   OSA (obstructive sleep apnea) 06/10/2015   Essential hypertension, benign 06/10/2015   Past Medical History:  Diagnosis Date   Allergy    Seasonal   Chicken pox    Heart murmur    Asymptomatic -- Patient endorses previous negative evaluation   Hypertension    Sleep apnea    CPAP nightly   Past Surgical History:  Procedure Laterality Date   COLONOSCOPY     NO PAST SURGERIES     WISDOM TOOTH EXTRACTION     No Known Allergies Prior to Admission medications   Medication Sig Start Date End Date Taking? Authorizing Provider  aspirin EC 81 MG tablet Take 81 mg by mouth daily.   Yes [provider]  Azelastine HCl 137 MCG/SPRAY SOLN PLACE 1-2 SPRAYS INTO BOTH NOSTRILS 2 (TWO) TIMES DAILY. USE IN EACH NOSTRIL AS DIRECTED 03/22/21  Yes Steven Agreste, MD  Ciclopirox 0.77 % gel Apply 1 application topically 2 (two) times daily. 07/02/21  Yes Steven Coss, Steven Sloan  fluticasone [facility]) 50 MCG/ACT nasal spray USE 2 SPRAYS IN BOTH  NOSTRILS DAILY 11/16/20  Yes Steven Agreste, MD  ibuprofen (ADVIL,MOTRIN) 600 MG tablet Take 1 tablet by mouth as needed. 04/09/15  Yes [provider]  itraconazole (SPORANOX) 100 MG capsule Take 1 capsule (100 mg total) by mouth daily. 07/02/21  Yes Steven Coss, Steven Sloan  lisinopril (ZESTRIL) 10 MG tablet TAKE 1 TABLET BY MOUTH  DAILY 10/18/21  Yes Steven Agreste, MD  Multiple Vitamins-Minerals (CENTRUM ADULTS) TABS Take 1 tablet by mouth daily.   Yes [provider]  Omega-3 Fatty Acids (FISH OIL) 1200 MG CAPS Take 2 capsules by mouth daily.    Yes [provider]  psyllium (REGULOID) 0.52 g capsule  Take 0.52 g by mouth daily.   Yes [provider]  terazosin (HYTRIN) 1 MG capsule TAKE 1 CAPSULE BY MOUTH AT  BEDTIME 10/18/21  Yes Steven Agreste, MD   Social History   Socioeconomic History   Marital status: Married    Spouse name: Not on file   Number of children: Not on file   Years of education: Not on file   Highest education level: Not on file  Occupational History   Not on file  Tobacco Use   Smoking status: Former    Packs/day: 0.25    Years: 25.00    Total pack years: 6.25    Types: Cigarettes    Quit date: 05/18/2015    Years since quitting: 6.5   Smokeless tobacco: Never  Vaping Use   Vaping Use: Never used  Substance and Sexual Activity   Alcohol use: Yes    Alcohol/week: 2.0 - 4.0 standard drinks of alcohol    Types: 2 - 4 Standard drinks or equivalent per week    Comment: social   Drug use: Not Currently    Types: Marijuana   Sexual activity: Yes  Other Topics Concern   Not on file  Social History Narrative   Not on file   Social Determinants of Health   Financial Resource Strain: Not on file  Food Insecurity: Not on file  Transportation Needs: Not on file  Physical Activity: Not on file  Stress: Not on file  Social Connections: Not on file  Intimate Partner Violence: Not on file    Review of Systems  Constitutional:  Negative for fatigue and unexpected weight change.  Eyes:  Negative for visual disturbance.  Respiratory:  Negative for cough, chest tightness and shortness of breath.   Cardiovascular:  Negative for chest pain, palpitations and leg swelling.  Gastrointestinal:  Negative for abdominal pain and blood in stool.  Neurological:  Negative for dizziness, light-headedness and headaches.     Objective:   Vitals:   11/25/21 0848  BP: 126/74  Pulse: 60  Resp: 16  Temp: 98.6 F (37 C)  TempSrc: Oral  SpO2: 97%  Weight: 236 lb (107 kg)  Height: $Remove'5\' 10"'DirPxeE$  (1.778 m)     Physical Exam Vitals reviewed.  Constitutional:       Appearance: He is well-developed.  HENT:     Head: Normocephalic and atraumatic.  Neck:     Vascular: No carotid bruit or JVD.  Cardiovascular:     Rate and Rhythm: Normal rate and regular rhythm.     Heart sounds: Murmur (9-8/3 systolic) heard.  Pulmonary:     Effort: Pulmonary effort is normal.     Breath sounds: Normal breath sounds. No rales.  Musculoskeletal:     Right lower leg: No edema.     Left lower leg: No edema.  Skin:  General: Skin is warm and dry.     Comments: Thickened, discolored toenails bilaterally.  Great toenails bilaterally, also with fifth toenail on right, second toenail on left.  No surrounding skin erythema or wounds.  Neurological:     Mental Status: He is alert and oriented to person, place, and time.  Psychiatric:        Mood and Affect: Mood normal.        Assessment & Plan:  Steven Sloan is a 61 y.o. male . Essential hypertension, benign - Plan: Comprehensive metabolic panel  -Stable, continue same regimen.  Updated labs ordered.  Follow-up with cardiology, asymptomatic with aortic stenosis at this time.  Hyperlipidemia, unspecified hyperlipidemia type - Plan: Lipid panel, atorvastatin (LIPITOR) 10 MG tablet Atherosclerosis of aorta (HCC) - Plan: atorvastatin (LIPITOR) 10 MG tablet  -Repeat labs ordered but with aortic atherosclerosis and coronary artery calcification noted on recent imaging decided to start statin.  Potential side effects discussed.  Recheck labs in 6 weeks.  Elevated serum creatinine - Plan: Ambulatory referral to Nephrology  -Suspect component of chronic kidney disease, overall stable readings past few years.  Refer to nephrology to decide on further testing, avoid NSAIDs, maintain hydration.  Continue routine monitoring.  Blood pressure control.  Onychomycosis - Plan: Ciclopirox 0.77 % gel, terbinafine (LAMISIL) 250 MG tablet  -Initially improved with treatment then recurred.  May need prolonged course.  With use of  statin, will change to Lamisil due to interaction with prior med.  6-week follow-up.  Okay to continue topical ciclopirox  Meds ordered this encounter  Medications   atorvastatin (LIPITOR) 10 MG tablet    Sig: Take 1 tablet (10 mg total) by mouth daily.    Dispense:  90 tablet    Refill:  0   Ciclopirox 0.77 % gel    Sig: Apply 1 application  topically 2 (two) times daily.    Dispense:  100 g    Refill:  0   terbinafine (LAMISIL) 250 MG tablet    Sig: Take 1 tablet (250 mg total) by mouth daily.    Dispense:  90 tablet    Refill:  0   Patient Instructions  I do recommend starting lipitor once per day based on some of the findings from your low-dose CAT scan of the chest.  Glad there were no concerning findings on that study.  Recheck in 6 weeks to evaluate cholesterol levels and liver test at that time.  Kidney function test appears to be slightly low over the past few years but overall stable.  I will refer you to a nephrologist to decide if other testing needed but for now make sure to stay hydrated and avoid NSAIDs like Advil if possible.  Blood pressure looks okay, no med changes.  We can restart treatments for toenail fungus.  It may just take a little bit more time to treat.  Recheck in 6 weeks.  Thanks for coming in today, have a great vacation!                         Signed,   Steven Ray, MD [facility] 11/25/21 9:58 AM

## 2021-12-06 ENCOUNTER — Encounter: Payer: Self-pay | Admitting: Family Medicine

## 2021-12-06 ENCOUNTER — Telehealth (INDEPENDENT_AMBULATORY_CARE_PROVIDER_SITE_OTHER): Payer: BC Managed Care – PPO | Admitting: Family Medicine

## 2021-12-06 DIAGNOSIS — U071 COVID-19: Secondary | ICD-10-CM

## 2021-12-06 MED ORDER — MOLNUPIRAVIR EUA 200MG CAPSULE: 4.0000 | ORAL_CAPSULE | Freq: Two times a day (BID) | ORAL | 0 refills | Status: AC

## 2021-12-06 NOTE — Progress Notes (Signed)
Virtual Visit via Video   I connected with patient on 12/06/21 at  1:20 PM EDT by a video enabled telemedicine application and verified that I am speaking with the correct person using two identifiers.  Location patient: Home Location provider: [facility], Office Persons participating in the virtual visit: Patient, Provider, [place] Marcille Blanco C)  I discussed the limitations of evaluation and management by telemedicine and the availability of in person appointments. The patient expressed understanding and agreed to proceed.  Subjective:   HPI:   COVID- pt was vacationing in [place] last week and started having chills Saturday while driving back.  Slept the entire car ride, slept again yesterday most of the day.  Tested 'strongly positive' last night.  Mild congestion, minimal cough.  Biggest sxs are 'chills and exhaustion'.  Pt is interested in antiviral medication.    ROS:   See pertinent positives and negatives per HPI.  Patient Active Problem List   Diagnosis Date Noted   Systolic murmur 16/01/9603   Class 2 severe obesity due to excess calories with serious comorbidity and body mass index (BMI) of 37.0 to 37.9 in adult [facility]) 08/13/2018   Prostate cancer screening 06/07/2017   Internal hemorrhoid 06/07/2017   Visit for preventive health examination 07/26/2015   Quit smoking 07/01/2015   OSA (obstructive sleep apnea) 06/10/2015   Essential hypertension, benign 06/10/2015    Social History   Tobacco Use   Smoking status: Former    Packs/day: 0.25    Years: 25.00    Total pack years: 6.25    Types: Cigarettes    Quit date: 05/18/2015    Years since quitting: 6.5   Smokeless tobacco: Never  Substance Use Topics   Alcohol use: Yes    Alcohol/week: 2.0 - 4.0 standard drinks of alcohol    Types: 2 - 4 Standard drinks or equivalent per week    Comment: social    Current Outpatient Medications:    aspirin EC 81 MG tablet, Take 81 mg by mouth daily., Disp: , Rfl:     atorvastatin (LIPITOR) 10 MG tablet, Take 1 tablet (10 mg total) by mouth daily., Disp: 90 tablet, Rfl: 0   Azelastine HCl 137 MCG/SPRAY SOLN, PLACE 1-2 SPRAYS INTO BOTH NOSTRILS 2 (TWO) TIMES DAILY. USE IN EACH NOSTRIL AS DIRECTED, Disp: 90 mL, Rfl: 2   Ciclopirox 0.77 % gel, Apply 1 application  topically 2 (two) times daily., Disp: 100 g, Rfl: 0   fluticasone (FLONASE) 50 MCG/ACT nasal spray, USE 2 SPRAYS IN BOTH  NOSTRILS DAILY, Disp: 48 g, Rfl: 11   ibuprofen (ADVIL,MOTRIN) 600 MG tablet, Take 1 tablet by mouth as needed., Disp: , Rfl: 0   lisinopril (ZESTRIL) 10 MG tablet, TAKE 1 TABLET BY MOUTH  DAILY, Disp: 90 tablet, Rfl: 3   Multiple Vitamins-Minerals (CENTRUM ADULTS) TABS, Take 1 tablet by mouth daily., Disp: , Rfl:    Omega-3 Fatty Acids (FISH OIL) 1200 MG CAPS, Take 2 capsules by mouth daily. , Disp: , Rfl:    psyllium (REGULOID) 0.52 g capsule, Take 0.52 g by mouth daily., Disp: , Rfl:    terazosin (HYTRIN) 1 MG capsule, TAKE 1 CAPSULE BY MOUTH AT  BEDTIME, Disp: 90 capsule, Rfl: 3   terbinafine (LAMISIL) 250 MG tablet, Take 1 tablet (250 mg total) by mouth daily., Disp: 90 tablet, Rfl: 0  No Known Allergies  Objective:   There were no vitals taken for this visit. AAOx3, NAD NCAT, EOMI No obvious CN deficits Coloring  WNL Pt is able to speak clearly, coherently without shortness of breath or increased work of breathing.  Thought process is linear.  Mood is appropriate.  Assessment and Plan:   COVID- new.  Given risk factors- pt is a former smoker, has heart valve issues, is obese- will start Molnupiravir.  Reviewed appropriate use, possible side effects.  Reviewed supportive care and red flags that should prompt return.  Pt expressed understanding and is in agreement w/ plan.    Annye Asa, MD 12/06/2021

## 2022-01-11 ENCOUNTER — Other Ambulatory Visit: Payer: Self-pay | Admitting: Family Medicine

## 2022-01-11 DIAGNOSIS — J309 Allergic rhinitis, unspecified: Secondary | ICD-10-CM

## 2022-01-12 ENCOUNTER — Other Ambulatory Visit: Payer: Self-pay | Admitting: Family Medicine

## 2022-01-13 ENCOUNTER — Ambulatory Visit (INDEPENDENT_AMBULATORY_CARE_PROVIDER_SITE_OTHER): Payer: BC Managed Care – PPO | Admitting: Adult Health

## 2022-01-13 ENCOUNTER — Encounter: Payer: Self-pay | Admitting: Adult Health

## 2022-01-13 DIAGNOSIS — G4733 Obstructive sleep apnea (adult) (pediatric): Secondary | ICD-10-CM | POA: Diagnosis not present

## 2022-01-13 DIAGNOSIS — Z6837 Body mass index (BMI) 37.0-37.9, adult: Secondary | ICD-10-CM

## 2022-01-13 DIAGNOSIS — J439 Emphysema, unspecified: Secondary | ICD-10-CM | POA: Diagnosis not present

## 2022-01-13 NOTE — Progress Notes (Signed)
Reviewed and agree with assessment/plan.   Chesley Mires, MD [facility] Pulmonary/Critical [facility] 01/13/2022, 1:20 PM Pager:  [phone]

## 2022-01-13 NOTE — Assessment & Plan Note (Signed)
Excellent control and compliance on CPAP.  No changes  Plan  Patient Instructions  Keep up the good work Continue on CPAP at bedtime Work on healthy weight Do not drive if  sleepy  Continue with Lung cancer CT screening program.  Follow-up in 4 months with PFTs and As needed

## 2022-01-13 NOTE — Progress Notes (Signed)
$'@Patient'c$  ID: Steven Sloan, male    DOB: Dec 31, 1960, 61 y.o.   MRN: [id number]  Chief Complaint  Patient presents with   Follow-up    Referring provider: Wendie Agreste, MD  HPI: 61 year old male former smoker followed for obstructive sleep apnea Participates in the lung cancer screening program  TEST/EVENTS :  2007 PSG AHI 40-60/hr     01/13/2022 Follow up : OSA  Patient presents for a 1 year follow-up.  Patient has underlying severe sleep apnea.  He is on CPAP at bedtime.  Patient says he wears a CPAP every single night.  Usually 8 or more hours.  Says he cannot sleep without it.  He says the power went off a few months ago and he was not able to sleep at all without his CPAP.  Patient says he feels that he benefits from CPAP with decreased daytime sleepiness.  CPAP download shows excellent compliance with daily average usage at 8.5 hours.  Patient is on auto CPAP 10 to 14 cm H2O.  AHI is 2/hour.  Daily average pressure at 12 cm H2O.,  Minimal leaks.  Will have Aortic valve replacement in next year due to  Aortic valve stenosis.  He is working on weight loss.  Patient is a former smoker.  Says he had a low-dose CT screening CT that showed some emphysema.  Patient has no previous diagnosis of COPD or asthma.  He says he is very active typically walks about 2 miles a day.  He did smoke previously.  Has a 35-pack-year history.  Patient says he has no significant dyspnea walking on a flat surface occasionally will get a little winded if he is going up an incline walking his dog.  Has no cough.  No Known Allergies  Immunization History  Administered Date(s) Administered   Influenza,inj,Quad PF,6+ Mos 06/10/2015, 01/22/2016, 01/30/2017, 02/16/2018, 12/26/2018, 01/27/2020, 02/05/2021   Influenza-Unspecified 01/30/2014   PFIZER Comirnaty(Gray Top)Covid-19 Tri-Sucrose Vaccine 07/31/2020   PFIZER(Purple Top)SARS-COV-2 Vaccination 07/23/2019, 08/13/2019, 02/14/2020, 02/05/2021   Pfizer  Covid-19 Vaccine Bivalent Booster 41yr & up 02/05/2021   Pneumococcal Conjugate-13 05/20/2021   Pneumococcal Polysaccharide-23 04/04/2019   Td 05/02/2006   Tdap 05/31/2016   Zoster Recombinat (Shingrix) 01/11/2019, 03/25/2019    Past Medical History:  Diagnosis Date   Allergy    Seasonal   Chicken pox    Heart murmur    Asymptomatic -- Patient endorses previous negative evaluation   Hypertension    Sleep apnea    CPAP nightly    Tobacco History: Social History   Tobacco Use  Smoking Status Former   Packs/day: 0.25   Years: 25.00   Total pack years: 6.25   Types: Cigarettes   Quit date: 05/18/2015   Years since quitting: 6.6  Smokeless Tobacco Never   Counseling given: Not Answered   Outpatient Medications Prior to Visit  Medication Sig Dispense Refill   aspirin EC 81 MG tablet Take 81 mg by mouth daily.     atorvastatin (LIPITOR) 10 MG tablet Take 1 tablet (10 mg total) by mouth daily. 90 tablet 0   Azelastine HCl 137 MCG/SPRAY SOLN PLACE 1-2 SPRAYS INTO BOTH NOSTRILS 2 (TWO) TIMES DAILY. USE IN EACH NOSTRIL AS DIRECTED 30 mL 2   Ciclopirox 0.77 % gel Apply 1 application  topically 2 (two) times daily. 100 g 0   fluticasone (FLONASE) 50 MCG/ACT nasal spray USE 2 SPRAYS IN BOTH  NOSTRILS DAILY 48 g 11   ibuprofen (ADVIL,MOTRIN) 600 MG tablet Take  1 tablet by mouth as needed.  0   lisinopril (ZESTRIL) 10 MG tablet TAKE 1 TABLET BY MOUTH  DAILY 90 tablet 3   Multiple Vitamins-Minerals (CENTRUM ADULTS) TABS Take 1 tablet by mouth daily.     Omega-3 Fatty Acids (FISH OIL) 1200 MG CAPS Take 2 capsules by mouth daily.      psyllium (REGULOID) 0.52 g capsule Take 0.52 g by mouth daily.     terazosin (HYTRIN) 1 MG capsule TAKE 1 CAPSULE BY MOUTH AT  BEDTIME 90 capsule 3   terbinafine (LAMISIL) 250 MG tablet Take 1 tablet (250 mg total) by mouth daily. 90 tablet 0   No facility-administered medications prior to visit.     Review of Systems:   Constitutional:   No   weight loss, night sweats,  Fevers, chills, fatigue, or  lassitude.  HEENT:   No headaches,  Difficulty swallowing,  Tooth/dental problems, or  Sore throat,                No sneezing, itching, ear ache, nasal congestion, post nasal drip,   CV:  No chest pain,  Orthopnea, PND, swelling in lower extremities, anasarca, dizziness, palpitations, syncope.   GI  No heartburn, indigestion, abdominal pain, nausea, vomiting, diarrhea, change in bowel habits, loss of appetite, bloody stools.   Resp: No shortness of breath with exertion or at rest.  No excess mucus, no productive cough,  No non-productive cough,  No coughing up of blood.  No change in color of mucus.  No wheezing.  No chest wall deformity  Skin: no rash or lesions.  GU: no dysuria, change in color of urine, no urgency or frequency.  No flank pain, no hematuria   MS:  No joint pain or swelling.  No decreased range of motion.  No back pain.    Physical Exam  BP 126/68 (BP Location: Left Arm, Patient Position: Sitting, Cuff Size: Normal)   Pulse 61   Temp 98.6 F (37 C) (Oral)   Ht '5\' 10"'$  (1.778 m)   Wt 243 lb 9.6 oz (110.5 kg)   SpO2 96%   BMI 34.95 kg/m   GEN: A/Ox3; pleasant , NAD, well nourished    HEENT:  [place]/AT,  NOSE-clear, THROAT-clear, no lesions, no postnasal drip or exudate noted.  Class III MP airway  NECK:  Supple w/ fair ROM; no JVD; normal carotid impulses w/o bruits; no thyromegaly or nodules palpated; no lymphadenopathy.    RESP  Clear  P & A; w/o, wheezes/ rales/ or rhonchi. no accessory muscle use, no dullness to percussion  CARD:  RRR, grade 2 systolic murmur, no peripheral edema, pulses intact, no cyanosis or clubbing.  GI:   Soft & nt; nml bowel sounds; no organomegaly or masses detected.   Musco: Warm bil, no deformities or joint swelling noted.   Neuro: alert, no focal deficits noted.    Skin: Warm, no lesions or rashes    Lab Results:   BNP No results found for: "BNP"  ProBNP No  results found for: "PROBNP"  Imaging: No results found.        No data to display          No results found for: "NITRICOXIDE"      Assessment & Plan:   OSA (obstructive sleep apnea) Excellent control and compliance on CPAP.  No changes  Plan  Patient Instructions  Keep up the good work Continue on CPAP at bedtime Work on healthy weight Do not drive  if  sleepy  Continue with Lung cancer CT screening program.  Follow-up in 4 months with PFTs and As needed        Class 2 severe obesity due to excess calories with serious comorbidity and body mass index (BMI) of 37.0 to 37.9 in adult [facility]) Healthy weight loss  Emphysema lung (HCC) Emphysema noted on CT scan.  No previous diagnosis of COPD or asthma.  Heavy smoking history with a 35-year pack history Patient has minimum symptom burden.  We will check PFTs to get baseline.  Patient is planning to have aortic valve surgery in the next year.Rexene Edison, NP 01/13/2022

## 2022-01-13 NOTE — Assessment & Plan Note (Signed)
Emphysema noted on CT scan.  No previous diagnosis of COPD or asthma.  Heavy smoking history with a 35-year pack history Patient has minimum symptom burden.  We will check PFTs to get baseline.  Patient is planning to have aortic valve surgery in the next year.Marland Kitchen

## 2022-01-13 NOTE — Patient Instructions (Addendum)
Keep up the good work Continue on CPAP at bedtime Work on healthy weight Do not drive if  sleepy  Continue with Lung cancer CT screening program.  Follow-up in 4 months with PFTs and As needed

## 2022-01-13 NOTE — Assessment & Plan Note (Signed)
Healthy weight loss 

## 2022-01-14 ENCOUNTER — Encounter: Payer: BC Managed Care – PPO | Admitting: Family Medicine

## 2022-01-17 ENCOUNTER — Other Ambulatory Visit: Payer: Self-pay

## 2022-01-17 ENCOUNTER — Ambulatory Visit (INDEPENDENT_AMBULATORY_CARE_PROVIDER_SITE_OTHER): Payer: BC Managed Care – PPO | Admitting: Family Medicine

## 2022-01-17 ENCOUNTER — Encounter: Payer: Self-pay | Admitting: Family Medicine

## 2022-01-17 VITALS — BP 134/68 | HR 71 | Temp 98.3°F | Resp 18 | Ht 70.0 in | Wt 247.6 lb

## 2022-01-17 DIAGNOSIS — I10 Essential (primary) hypertension: Secondary | ICD-10-CM

## 2022-01-17 DIAGNOSIS — E785 Hyperlipidemia, unspecified: Secondary | ICD-10-CM | POA: Diagnosis not present

## 2022-01-17 DIAGNOSIS — I700 Atherosclerosis of aorta: Secondary | ICD-10-CM | POA: Diagnosis not present

## 2022-01-17 DIAGNOSIS — B351 Tinea unguium: Secondary | ICD-10-CM | POA: Diagnosis not present

## 2022-01-17 DIAGNOSIS — K0889 Other specified disorders of teeth and supporting structures: Secondary | ICD-10-CM

## 2022-01-17 LAB — LIPID PANEL
Cholesterol: 125 mg/dL (ref 0–200)
HDL: 57.3 mg/dL (ref >39.00)
LDL Cholesterol: 55 mg/dL (ref 0–99)
NonHDL: 67.89
Total CHOL/HDL Ratio: 2
Triglycerides: 65 mg/dL (ref 0.0–149.0)
VLDL: 13 mg/dL (ref 0.0–40.0)

## 2022-01-17 LAB — COMPREHENSIVE METABOLIC PANEL
ALT: 63 U/L — ABNORMAL HIGH (ref 0–53)
AST: 39 U/L — ABNORMAL HIGH (ref 0–37)
Albumin: 4.4 g/dL (ref 3.5–5.2)
Alkaline Phosphatase: 52 U/L (ref 39–117)
BUN: 24 mg/dL — ABNORMAL HIGH (ref 6–23)
CO2: 27 mEq/L (ref 19–32)
Calcium: 10.3 mg/dL (ref 8.4–10.5)
Chloride: 105 mEq/L (ref 96–112)
Creatinine, Ser: 1.44 mg/dL (ref 0.40–1.50)
GFR: 53.42 mL/min — ABNORMAL LOW (ref >60.00)
Glucose, Bld: 99 mg/dL (ref 70–99)
Potassium: 4.9 mEq/L (ref 3.5–5.1)
Sodium: 140 mEq/L (ref 135–145)
Total Bilirubin: 0.8 mg/dL (ref 0.2–1.2)
Total Protein: 8 g/dL (ref 6.0–8.3)

## 2022-01-17 MED ORDER — CICLOPIROX 0.77 % EX GEL: 1.0000 "application " | Freq: Two times a day (BID) | CUTANEOUS | 0 refills | Status: DC

## 2022-01-17 MED ORDER — ATORVASTATIN CALCIUM 10 MG PO TABS: 10.0000 mg | ORAL_TABLET | Freq: Every day | ORAL | 1 refills | Status: DC

## 2022-01-17 MED ORDER — TERBINAFINE HCL 250 MG PO TABS: 250.0000 mg | ORAL_TABLET | Freq: Every day | ORAL | 0 refills | Status: DC

## 2022-01-17 NOTE — Patient Instructions (Signed)
Thanks for coming in today.  No change in Lipitor dose for now but I will let you know if there are concerns on your labs.  Toenail fungus appears to be improving, okay to continue treatments for another 2 to 3 months.  If not resolved at that time,  we can discuss further plans. I do not see a tooth abscess at this time but I do recommend calling your dentist to follow-up on the dental discomfort. Follow-up in 3 months, let me know if there are questions in the meantime.  Take care

## 2022-01-17 NOTE — Progress Notes (Signed)
Subjective:  Patient ID: Steven Sloan, male    DOB: 10-01-1960  Age: 61 y.o. MRN: [id number]  CC:  Chief Complaint  Patient presents with   Follow-up    Patient states he is here for a follow up on labs and new medication.    HPI Geovannie Vilar presents for   Hyperlipidemia: Lipitor 10 mg daily started at his July 27 visit, with noted aortic atherosclerosis and coronary artery calcification on CT for lung cancer screening in April. Currently taking 10 mg daily.  No new myalgias or side effects. Fasting today.  Lab Results  Component Value Date   CHOL 185 11/25/2021   HDL 52.00 11/25/2021   LDLCALC 111 (H) 11/25/2021   TRIG 110.0 11/25/2021   CHOLHDL 4 11/25/2021   Lab Results  Component Value Date   ALT 22 11/25/2021   AST 20 11/25/2021   ALKPHOS 54 11/25/2021   BILITOT 1.3 (H) 11/25/2021   Elevated creatinine See last visit.  Referred to nephrology - has not received call yet.  Creatinine 1.51 in January, lower since then at 1.40 in February, 1.34 in [place] controlled without new med side effects.  BP Readings from Last 3 Encounters:  01/17/22 134/68  01/13/22 126/68  11/25/21 126/74    Tooth abscess: Has been treated by dentist with xray, root canal, 7 day course course of abx. Repeat infection 1.5 month ago - dentist Rx 10 day course of abx. Improved, but still some soreness in same area. Plans to call dentist. No fever.  No face or gum/mouth swelling, no hot/cold sensitivity, able to chew ok.   Onychomycosis Initially treated by my colleague in March with itraconazole and topical ciclopirox.  Initially improved on meds, but returned off meds in June.  Thickened/discolored nails of great toenails.  Due to potential interactions with prior medication he was changed to Lamisil and continued topical ciclopirox. Improving, no new side effects with meds, not yet normal .  Lab Results  Component Value Date   ALT 22 11/25/2021   AST 20 11/25/2021   ALKPHOS 54  11/25/2021   BILITOT 1.3 (H) 11/25/2021        History Patient Active Problem List   Diagnosis Date Noted   Emphysema lung ([place]) 21/30/8657   Systolic murmur 84/69/6295   Class 2 severe obesity due to excess calories with serious comorbidity and body mass index (BMI) of 37.0 to 37.9 in adult [facility]) 08/13/2018   Prostate cancer screening 06/07/2017   Internal hemorrhoid 06/07/2017   Visit for preventive health examination 07/26/2015   Quit smoking 07/01/2015   OSA (obstructive sleep apnea) 06/10/2015   Essential hypertension, benign 06/10/2015   Past Medical History:  Diagnosis Date   Allergy    Seasonal   Chicken pox    Heart murmur    Asymptomatic -- Patient endorses previous negative evaluation   Hypertension    Sleep apnea    CPAP nightly   Past Surgical History:  Procedure Laterality Date   COLONOSCOPY     NO PAST SURGERIES     WISDOM TOOTH EXTRACTION     No Known Allergies Prior to Admission medications   Medication Sig Start Date End Date Taking? Authorizing Provider  aspirin EC 81 MG tablet Take 81 mg by mouth daily.   Yes [provider]  atorvastatin (LIPITOR) 10 MG tablet Take 1 tablet (10 mg total) by mouth daily. 11/25/21  Yes Wendie Agreste, MD  Azelastine HCl 137 MCG/SPRAY SOLN PLACE 1-2 SPRAYS INTO  BOTH NOSTRILS 2 (TWO) TIMES DAILY. USE IN EACH NOSTRIL AS DIRECTED 01/12/22  Yes Wendie Agreste, MD  Ciclopirox 0.77 % gel Apply 1 application  topically 2 (two) times daily. 11/25/21  Yes Wendie Agreste, MD  fluticasone [facility]) 50 MCG/ACT nasal spray USE 2 SPRAYS IN BOTH  NOSTRILS DAILY 01/11/22  Yes Wendie Agreste, MD  ibuprofen (ADVIL,MOTRIN) 600 MG tablet Take 1 tablet by mouth as needed. 04/09/15  Yes [provider]  lisinopril (ZESTRIL) 10 MG tablet TAKE 1 TABLET BY MOUTH  DAILY 10/18/21  Yes Wendie Agreste, MD  Multiple Vitamins-Minerals (CENTRUM ADULTS) TABS Take 1 tablet by mouth daily.   Yes [provider]   Omega-3 Fatty Acids (FISH OIL) 1200 MG CAPS Take 2 capsules by mouth daily.    Yes [provider]  psyllium (REGULOID) 0.52 g capsule Take 0.52 g by mouth daily.   Yes [provider]  terazosin (HYTRIN) 1 MG capsule TAKE 1 CAPSULE BY MOUTH AT  BEDTIME 10/18/21  Yes Wendie Agreste, MD  terbinafine (LAMISIL) 250 MG tablet Take 1 tablet (250 mg total) by mouth daily. 11/25/21  Yes Wendie Agreste, MD   Social History   Socioeconomic History   Marital status: Married    Spouse name: Not on file   Number of children: Not on file   Years of education: Not on file   Highest education level: Not on file  Occupational History   Not on file  Tobacco Use   Smoking status: Former    Packs/day: 0.25    Years: 25.00    Total pack years: 6.25    Types: Cigarettes    Quit date: 05/18/2015    Years since quitting: 6.6   Smokeless tobacco: Never  Vaping Use   Vaping Use: Never used  Substance and Sexual Activity   Alcohol use: Yes    Alcohol/week: 2.0 - 4.0 standard drinks of alcohol    Types: 2 - 4 Standard drinks or equivalent per week    Comment: social   Drug use: Not Currently    Types: Marijuana   Sexual activity: Yes  Other Topics Concern   Not on file  Social History Narrative   Not on file   Social Determinants of Health   Financial Resource Strain: Not on file  Food Insecurity: Not on file  Transportation Needs: Not on file  Physical Activity: Not on file  Stress: Not on file  Social Connections: Not on file  Intimate Partner Violence: Not on file    Review of Systems Per HPI   Objective:   Vitals:   01/17/22 1101  BP: 134/68  Pulse: 71  Resp: 18  Temp: 98.3 F (36.8 C)  TempSrc: Temporal  SpO2: 98%  Weight: 247 lb 9.6 oz (112.3 kg)  Height: '5\' 10"'$  (1.778 m)     Physical Exam Vitals reviewed.  Constitutional:      Appearance: He is well-developed.  HENT:     Head: Normocephalic and atraumatic.     Mouth/Throat:     Comments:  Moist oral mucosa, no apparent gum swelling or significant erythema, no discharge.  No facial swelling.  No appreciable lymphadenopathy of neck.  Clearing secretions normally. Neck:     Vascular: No carotid bruit or JVD.  Cardiovascular:     Rate and Rhythm: Normal rate and regular rhythm.     Heart sounds: Normal heart sounds. No murmur heard. Pulmonary:     Effort: Pulmonary effort  is normal.     Breath sounds: Normal breath sounds. No rales.  Musculoskeletal:     Right lower leg: No edema.     Left lower leg: No edema.  Skin:    General: Skin is warm and dry.     Comments: Discoloration of distal great toenails bilaterally see photo.  Neurological:     Mental Status: He is alert and oriented to person, place, and time.  Psychiatric:        Mood and Affect: Mood normal.         Assessment & Plan:  Bladen Umar is a 61 y.o. male . Hyperlipidemia, unspecified hyperlipidemia type - Plan: Lipid panel, atorvastatin (LIPITOR) 10 MG tablet  -Tolerating statin, continue same, check updated labs.  Essential hypertension, benign - Plan: Comprehensive metabolic panel  -Check creatinine, improved previously.  Continue same regimen with lisinopril, terazosin  Atherosclerosis of aorta (HCC) - Plan: atorvastatin (LIPITOR) 10 MG tablet  -As above, check lipids, would like to see LDL under 70 given atherosclerosis and coronary calcification seen previously.  Onychomycosis - Plan: terbinafine (LAMISIL) 250 MG tablet, Ciclopirox 0.77 % gel  -Improving.  Continue Lamisil, ciclopirox, check LFTs as above.  Recheck 3 months  Pain, dental  -New concern, I do not see any obvious signs of abscess or symptoms of abscess at this time but recommended he contact his dentist to evaluate further as they may want to see him this week.  RTC precautions given.  Meds ordered this encounter  Medications   atorvastatin (LIPITOR) 10 MG tablet    Sig: Take 1 tablet (10 mg total) by mouth daily.     Dispense:  90 tablet    Refill:  1   terbinafine (LAMISIL) 250 MG tablet    Sig: Take 1 tablet (250 mg total) by mouth daily.    Dispense:  90 tablet    Refill:  0   Ciclopirox 0.77 % gel    Sig: Apply 1 application  topically 2 (two) times daily.    Dispense:  100 g    Refill:  0   Patient Instructions  Thanks for coming in today.  No change in Lipitor dose for now but I will let you know if there are concerns on your labs.  Toenail fungus appears to be improving, okay to continue treatments for another 2 to 3 months.  If not resolved at that time,  we can discuss further plans. I do not see a tooth abscess at this time but I do recommend calling your dentist to follow-up on the dental discomfort. Follow-up in 3 months, let me know if there are questions in the meantime.  Take care    Signed,   Merri Ray, MD [place] 01/17/22 12:13 PM

## 2022-01-25 ENCOUNTER — Other Ambulatory Visit: Payer: Self-pay | Admitting: Family Medicine

## 2022-01-25 DIAGNOSIS — R7989 Other specified abnormal findings of blood chemistry: Secondary | ICD-10-CM

## 2022-01-25 NOTE — Progress Notes (Signed)
See labs 

## 2022-02-04 ENCOUNTER — Other Ambulatory Visit: Payer: Self-pay | Admitting: Family Medicine

## 2022-02-04 ENCOUNTER — Other Ambulatory Visit: Payer: Self-pay | Admitting: Lab

## 2022-03-09 ENCOUNTER — Other Ambulatory Visit (INDEPENDENT_AMBULATORY_CARE_PROVIDER_SITE_OTHER): Payer: BC Managed Care – PPO

## 2022-03-09 DIAGNOSIS — R7989 Other specified abnormal findings of blood chemistry: Secondary | ICD-10-CM | POA: Diagnosis not present

## 2022-03-09 LAB — HEPATIC FUNCTION PANEL
ALT: 39 U/L (ref 0–53)
AST: 26 U/L (ref 0–37)
Albumin: 4.6 g/dL (ref 3.5–5.2)
Alkaline Phosphatase: 50 U/L (ref 39–117)
Bilirubin, Direct: 0.2 mg/dL (ref 0.0–0.3)
Total Bilirubin: 0.9 mg/dL (ref 0.2–1.2)
Total Protein: 7.6 g/dL (ref 6.0–8.3)

## 2022-04-14 ENCOUNTER — Encounter: Payer: Self-pay | Admitting: Family Medicine

## 2022-04-14 ENCOUNTER — Ambulatory Visit (INDEPENDENT_AMBULATORY_CARE_PROVIDER_SITE_OTHER): Payer: BC Managed Care – PPO | Admitting: Family Medicine

## 2022-04-14 VITALS — BP 138/68 | HR 50 | Temp 97.7°F | Ht 70.0 in | Wt 250.4 lb

## 2022-04-14 DIAGNOSIS — E669 Obesity, unspecified: Secondary | ICD-10-CM

## 2022-04-14 DIAGNOSIS — E785 Hyperlipidemia, unspecified: Secondary | ICD-10-CM

## 2022-04-14 DIAGNOSIS — I10 Essential (primary) hypertension: Secondary | ICD-10-CM | POA: Diagnosis not present

## 2022-04-14 DIAGNOSIS — B351 Tinea unguium: Secondary | ICD-10-CM | POA: Diagnosis not present

## 2022-04-14 DIAGNOSIS — I700 Atherosclerosis of aorta: Secondary | ICD-10-CM

## 2022-04-14 DIAGNOSIS — Z6835 Body mass index (BMI) 35.0-35.9, adult: Secondary | ICD-10-CM

## 2022-04-14 LAB — BASIC METABOLIC PANEL
BUN: 26 mg/dL — ABNORMAL HIGH (ref 6–23)
CO2: 31 mEq/L (ref 19–32)
Calcium: 10.2 mg/dL (ref 8.4–10.5)
Chloride: 102 mEq/L (ref 96–112)
Creatinine, Ser: 1.33 mg/dL (ref 0.40–1.50)
GFR: 58.67 mL/min — ABNORMAL LOW (ref >60.00)
Glucose, Bld: 85 mg/dL (ref 70–99)
Potassium: 4.2 mEq/L (ref 3.5–5.1)
Sodium: 140 mEq/L (ref 135–145)

## 2022-04-14 MED ORDER — ATORVASTATIN CALCIUM 10 MG PO TABS: 10.0000 mg | ORAL_TABLET | Freq: Every day | ORAL | 1 refills | Status: DC

## 2022-04-14 NOTE — Progress Notes (Signed)
Subjective:  Patient ID: Steven Sloan, male    DOB: 1960-05-11  Age: 61 y.o. MRN: [id number]  CC:  Chief Complaint  Patient presents with   Hyperlipidemia    Pt states all is well   Weight Loss    Pt is frustrated he can't loose weight     HPI Steven Sloan presents for   Hypertension: Lisinopril 10 mg daily, terazosin 1 mg daily.  Elevated creatinine noted previously and referred to nephrology. Tolerating meds, no new side effects.  Hx of severe aortic stenosis. No new shortness of breath or CP with activity. No chest tightness. Appt with cardiology in March.  Home readings: 125/76, 133/73.  Has not received call form nephrology.  BP Readings from Last 3 Encounters:  04/14/22 138/68  01/17/22 134/68  01/13/22 126/68   Lab Results  Component Value Date   CREATININE 1.44 01/17/2022   Onychomycosis: Completed lamisil. Still using ciclopirox. Left great toenail 1/2 way better. R clear.    Hyperlipidemia with obesity Lipitor 10 mg daily.no new se's or myalgias.   Aortic atherosclerosis and coronary artery calcification noted on CT for lung cancer screening in April. Elevated LFTs in September, improved in November. Trouble losing weight. Up 3# from last visit.  No sweet tea/soda. 4 alcohol drinks per week.  Feels like eating too much. Had tried keto mindset, then since end of summer - less dietary adherence. Snacking at night.  Take out or restaurant food 3 days per week. Declines nutrition referral for now - will d/w wife.  Exercise: 2 mile walk with dog 4 days per week. 1 hour. Working on more days.   Not wanting to consider medication or surgical options at this time. Plans on diet changes.   Wt Readings from Last 3 Encounters:  04/14/22 250 lb 6.4 oz (113.6 kg)  01/17/22 247 lb 9.6 oz (112.3 kg)  01/13/22 243 lb 9.6 oz (110.5 kg)   Body mass index is 35.93 kg/m.  Lab Results  Component Value Date   CHOL 125 01/17/2022   HDL 57.30 01/17/2022   LDLCALC 55  01/17/2022   TRIG 65.0 01/17/2022   CHOLHDL 2 01/17/2022   Lab Results  Component Value Date   ALT 39 03/09/2022   AST 26 03/09/2022   ALKPHOS 50 03/09/2022   BILITOT 0.9 03/09/2022   HM: He has received covid and flu vaccine in October.  Colonoscopy planned in 2024.   History Patient Active Problem List   Diagnosis Date Noted   Emphysema lung ([place]) 29/51/8841   Systolic murmur 66/10/3014   Class 2 severe obesity due to excess calories with serious comorbidity and body mass index (BMI) of 37.0 to 37.9 in adult [facility]) 08/13/2018   Prostate cancer screening 06/07/2017   Internal hemorrhoid 06/07/2017   Visit for preventive health examination 07/26/2015   Quit smoking 07/01/2015   OSA (obstructive sleep apnea) 06/10/2015   Essential hypertension, benign 06/10/2015   Past Medical History:  Diagnosis Date   Allergy    Seasonal   Chicken pox    Heart murmur    Asymptomatic -- Patient endorses previous negative evaluation   Hypertension    Sleep apnea    CPAP nightly   Past Surgical History:  Procedure Laterality Date   COLONOSCOPY     NO PAST SURGERIES     WISDOM TOOTH EXTRACTION     No Known Allergies Prior to Admission medications   Medication Sig Start Date End Date Taking? Authorizing Provider  aspirin EC  81 MG tablet Take 81 mg by mouth daily.   Yes [provider]  atorvastatin (LIPITOR) 10 MG tablet Take 1 tablet (10 mg total) by mouth daily. 01/17/22  Yes Wendie Agreste, MD  Azelastine HCl 137 MCG/SPRAY SOLN PLACE 1-2 SPRAYS INTO BOTH NOSTRILS 2 (TWO) TIMES DAILY. USE IN EACH NOSTRIL AS DIRECTED 02/04/22  Yes Wendie Agreste, MD  Ciclopirox 0.77 % gel Apply 1 application  topically 2 (two) times daily. 01/17/22  Yes Wendie Agreste, MD  fluticasone [facility]) 50 MCG/ACT nasal spray USE 2 SPRAYS IN BOTH  NOSTRILS DAILY 01/11/22  Yes Wendie Agreste, MD  lisinopril (ZESTRIL) 10 MG tablet TAKE 1 TABLET BY MOUTH  DAILY 10/18/21  Yes Wendie Agreste, MD   Multiple Vitamins-Minerals (CENTRUM ADULTS) TABS Take 1 tablet by mouth daily.   Yes [provider]  Omega-3 Fatty Acids (FISH OIL) 1200 MG CAPS Take 2 capsules by mouth daily.    Yes [provider]  psyllium (REGULOID) 0.52 g capsule Take 0.52 g by mouth daily.   Yes [provider]  terazosin (HYTRIN) 1 MG capsule TAKE 1 CAPSULE BY MOUTH AT  BEDTIME 10/18/21  Yes Wendie Agreste, MD  terbinafine (LAMISIL) 250 MG tablet Take 1 tablet (250 mg total) by mouth daily. Patient not taking: Reported on 04/14/2022 01/17/22   Wendie Agreste, MD   Social History   Socioeconomic History   Marital status: Married    Spouse name: Not on file   Number of children: Not on file   Years of education: Not on file   Highest education level: Not on file  Occupational History   Not on file  Tobacco Use   Smoking status: Former    Packs/day: 0.25    Years: 25.00    Total pack years: 6.25    Types: Cigarettes    Quit date: 05/18/2015    Years since quitting: 6.9   Smokeless tobacco: Never  Vaping Use   Vaping Use: Never used  Substance and Sexual Activity   Alcohol use: Yes    Alcohol/week: 2.0 - 4.0 standard drinks of alcohol    Types: 2 - 4 Standard drinks or equivalent per week    Comment: social   Drug use: Not Currently    Types: Marijuana   Sexual activity: Yes  Other Topics Concern   Not on file  Social History Narrative   Not on file   Social Determinants of Health   Financial Resource Strain: Not on file  Food Insecurity: Not on file  Transportation Needs: Not on file  Physical Activity: Not on file  Stress: Not on file  Social Connections: Not on file  Intimate Partner Violence: Not on file    Review of Systems  Constitutional:  Negative for fatigue and unexpected weight change.  Eyes:  Negative for visual disturbance.  Respiratory:  Negative for cough, chest tightness and shortness of breath.   Cardiovascular:  Negative for chest pain,  palpitations and leg swelling.  Gastrointestinal:  Negative for abdominal pain and blood in stool.  Neurological:  Negative for dizziness, light-headedness and headaches.     Objective:   Vitals:   04/14/22 1052  BP: 138/68  Pulse: (!) 50  Temp: 97.7 F (36.5 C)  SpO2: 97%  Weight: 250 lb 6.4 oz (113.6 kg)  Height: '5\' 10"'$  (1.778 m)     Physical Exam Vitals reviewed.  Constitutional:      Appearance: He is well-developed.  HENT:     Head: Normocephalic and atraumatic.  Neck:     Vascular: No carotid bruit or JVD.  Cardiovascular:     Rate and Rhythm: Normal rate and regular rhythm.     Heart sounds: Normal heart sounds. No murmur heard. Pulmonary:     Effort: Pulmonary effort is normal.     Breath sounds: Normal breath sounds. No rales.  Musculoskeletal:     Right lower leg: No edema.     Left lower leg: No edema.  Skin:    General: Skin is warm and dry.  Neurological:     Mental Status: He is alert and oriented to person, place, and time.  Psychiatric:        Mood and Affect: Mood normal.        Assessment & Plan:  Steven Sloan is a 61 y.o. male . Essential hypertension, benign - Plan: Basic metabolic panel  -  Stable, tolerating current regimen. Medications refilled. Labs pending as above.  Denies symptoms with his severe arctic stenosis, RTC/cardiology follow-up precautions given.  Obesity/weight concerns  -Weight concerns discussed.  Recommendations given regarding portion sizes, techniques for minimizing portion sizes, timing of meals and food choices.  Option to meet with nutritionist, declined initially, also declines medical treatment or surgical treatment at this time.  Atherosclerosis of aorta (HCC) - Plan: atorvastatin (LIPITOR) 10 MG tablet Hyperlipidemia, unspecified hyperlipidemia type - Plan: atorvastatin (LIPITOR) 10 MG tablet  -Tolerating Lipitor, continue same.  Onychomycosis Continue ciclopirox.  Meds ordered this encounter   Medications   atorvastatin (LIPITOR) 10 MG tablet    Sig: Take 1 tablet (10 mg total) by mouth daily.    Dispense:  90 tablet    Refill:  1   Patient Instructions  Thanks for coming in today.  Try the techniques we discussed for regulating portion sizes with meals, and try to avoid late night snacking, or healthy snacks if needed.  Let me know if you would like to meet with a nutritionist.  No change in medications at this time, follow-up in 6 months, but I am happy to see you sooner if needed.  Take care.     Signed,   Merri Ray, MD [place] 04/14/22 11:29 AM

## 2022-04-14 NOTE — Patient Instructions (Signed)
Thanks for coming in today.  Try the techniques we discussed for regulating portion sizes with meals, and try to avoid late night snacking, or healthy snacks if needed.  Let me know if you would like to meet with a nutritionist.  No change in medications at this time, follow-up in 6 months, but I am happy to see you sooner if needed.  Take care.

## 2022-05-17 ENCOUNTER — Other Ambulatory Visit: Payer: Self-pay | Admitting: Emergency Medicine

## 2022-05-17 ENCOUNTER — Ambulatory Visit (INDEPENDENT_AMBULATORY_CARE_PROVIDER_SITE_OTHER): Payer: BC Managed Care – PPO | Admitting: Pulmonary Disease

## 2022-05-17 ENCOUNTER — Encounter: Payer: Self-pay | Admitting: Adult Health

## 2022-05-17 ENCOUNTER — Ambulatory Visit (INDEPENDENT_AMBULATORY_CARE_PROVIDER_SITE_OTHER): Payer: BC Managed Care – PPO | Admitting: Adult Health

## 2022-05-17 VITALS — BP 118/70 | HR 72 | Temp 98.0°F | Ht 69.0 in | Wt 254.6 lb

## 2022-05-17 DIAGNOSIS — J439 Emphysema, unspecified: Secondary | ICD-10-CM

## 2022-05-17 DIAGNOSIS — R011 Cardiac murmur, unspecified: Secondary | ICD-10-CM

## 2022-05-17 DIAGNOSIS — G4733 Obstructive sleep apnea (adult) (pediatric): Secondary | ICD-10-CM

## 2022-05-17 DIAGNOSIS — Z6837 Body mass index (BMI) 37.0-37.9, adult: Secondary | ICD-10-CM

## 2022-05-17 LAB — PULMONARY FUNCTION TEST
DL/VA % pred: 100 %
DL/VA: 4.28 ml/min/mmHg/L
DLCO cor % pred: 102 %
DLCO cor: 27.55 ml/min/mmHg
DLCO unc % pred: 102 %
DLCO unc: 27.55 ml/min/mmHg
FEF 25-75 Post: 2.96 L/sec
FEF 25-75 Pre: 2.65 L/sec
FEF2575-%Change-Post: 11 %
FEF2575-%Pred-Post: 101 %
FEF2575-%Pred-Pre: 90 %
FEV1-%Change-Post: 2 %
FEV1-%Pred-Post: 99 %
FEV1-%Pred-Pre: 96 %
FEV1-Post: 3.5 L
FEV1-Pre: 3.41 L
FEV1FVC-%Change-Post: 3 %
FEV1FVC-%Pred-Pre: 99 %
FEV6-%Change-Post: 0 %
FEV6-%Pred-Post: 101 %
FEV6-%Pred-Pre: 101 %
FEV6-Post: 4.47 L
FEV6-Pre: 4.51 L
FEV6FVC-%Change-Post: 0 %
FEV6FVC-%Pred-Post: 104 %
FEV6FVC-%Pred-Pre: 104 %
FVC-%Change-Post: -1 %
FVC-%Pred-Post: 96 %
FVC-%Pred-Pre: 97 %
FVC-Post: 4.48 L
FVC-Pre: 4.52 L
Post FEV1/FVC ratio: 78 %
Post FEV6/FVC ratio: 100 %
Pre FEV1/FVC ratio: 75 %
Pre FEV6/FVC Ratio: 100 %
RV % pred: 134 %
RV: 2.92 L
TLC % pred: 110 %
TLC: 7.48 L

## 2022-05-17 NOTE — Patient Instructions (Signed)
Keep up the good work Continue on CPAP at bedtime Work on healthy weight Do not drive if  sleepy Continue with Lung cancer CT screening program.  Follow-up in 1 year and As needed

## 2022-05-17 NOTE — Progress Notes (Signed)
$'@Patient'c$  ID: Steven Sloan, male    DOB: 08-30-1960, 62 y.o.   MRN: [id number]  Chief Complaint  Patient presents with   Follow-up    Referring provider: Wendie Agreste, MD  HPI: 62 year old male former smoker followed for obstructive sleep apnea Participates in lung cancer screening program Medical history significant for aortic valve stenosis  TEST/EVENTS :  2007 PSG AHI 40-60/hr    05/17/2022 Follow up : OSA and  Patient returns for a 93-monthfollow-up.  Patient is a former smoker.  Participates in the lung cancer screening program.  CT chest showed some emphysema.  He is planning for a aortic valve replacement at some point in the future  Says he is very active typically walks about 2 miles a day.  Patient did smoke previously has a 35-pack-year history.  Occasionally gets short of breath if he walks up an incline.  Patient was set up for PFTs today.  That showed normal lung function with FEV1 at 99%, ratio 78, FVC 96%, DLCO 102%.  Patient has underlying sleep apnea.  He remains on CPAP.  Says he wears his CPAP every single night.  Feels he benefits from CPAP with decreased daytime sleepiness.  CPAP download shows excellent compliance with 100% usage.  Daily average usage at 8 hours.  Patient is on auto CPAP 10 to 14 cm H2O.  AHI is 3.3/hour. Uses nasal pillows.   Continues with weight loss , eating healthy and walking . Down 30lbs.    No Known Allergies  Immunization History  Administered Date(s) Administered   Influenza,inj,Quad PF,6+ Mos 06/10/2015, 01/22/2016, 01/30/2017, 02/16/2018, 12/26/2018, 01/27/2020, 02/03/2021, 02/05/2021   Influenza-Unspecified 01/30/2014   PFIZER Comirnaty(Gray Top)Covid-19 Tri-Sucrose Vaccine 07/31/2020   PFIZER(Purple Top)SARS-COV-2 Vaccination 07/23/2019, 08/13/2019, 02/14/2020, 02/05/2021   Pfizer Covid-19 Vaccine Bivalent Booster 117yr& up 02/05/2021, 03/10/2022   Pneumococcal Conjugate-13 05/20/2021   Pneumococcal Polysaccharide-23  04/04/2019   Td 05/02/2006   Tdap 05/31/2016   Zoster Recombinat (Shingrix) 01/11/2019, 03/25/2019    Past Medical History:  Diagnosis Date   Allergy    Seasonal   Chicken pox    Heart murmur    Asymptomatic -- Patient endorses previous negative evaluation   Hypertension    Sleep apnea    CPAP nightly    Tobacco History: Social History   Tobacco Use  Smoking Status Former   Packs/day: 0.25   Years: 25.00   Total pack years: 6.25   Types: Cigarettes   Quit date: 05/18/2015   Years since quitting: 7.0  Smokeless Tobacco Never   Counseling given: Not Answered   Outpatient Medications Prior to Visit  Medication Sig Dispense Refill   aspirin EC 81 MG tablet Take 81 mg by mouth daily.     atorvastatin (LIPITOR) 10 MG tablet Take 1 tablet (10 mg total) by mouth daily. 90 tablet 1   Azelastine HCl 137 MCG/SPRAY SOLN PLACE 1-2 SPRAYS INTO BOTH NOSTRILS 2 (TWO) TIMES DAILY. USE IN EACH NOSTRIL AS DIRECTED 90 mL 3   Ciclopirox 0.77 % gel Apply 1 application  topically 2 (two) times daily. 100 g 0   fluticasone (FLONASE) 50 MCG/ACT nasal spray USE 2 SPRAYS IN BOTH  NOSTRILS DAILY 48 g 11   lisinopril (ZESTRIL) 10 MG tablet TAKE 1 TABLET BY MOUTH  DAILY 90 tablet 3   Multiple Vitamins-Minerals (CENTRUM ADULTS) TABS Take 1 tablet by mouth daily.     Omega-3 Fatty Acids (FISH OIL) 1200 MG CAPS Take 2 capsules by mouth daily.  psyllium (REGULOID) 0.52 g capsule Take 0.52 g by mouth daily.     terazosin (HYTRIN) 1 MG capsule TAKE 1 CAPSULE BY MOUTH AT  BEDTIME 90 capsule 3   No facility-administered medications prior to visit.     Review of Systems:   Constitutional:   No  weight loss, night sweats,  Fevers, chills, fatigue, or  lassitude.  HEENT:   No headaches,  Difficulty swallowing,  Tooth/dental problems, or  Sore throat,                No sneezing, itching, ear ache, nasal congestion, post nasal drip,   CV:  No chest pain,  Orthopnea, PND, swelling in lower  extremities, anasarca, dizziness, palpitations, syncope.   GI  No heartburn, indigestion, abdominal pain, nausea, vomiting, diarrhea, change in bowel habits, loss of appetite, bloody stools.   Resp: No shortness of breath with exertion or at rest.  No excess mucus, no productive cough,  No non-productive cough,  No coughing up of blood.  No change in color of mucus.  No wheezing.  No chest wall deformity  Skin: no rash or lesions.  GU: no dysuria, change in color of urine, no urgency or frequency.  No flank pain, no hematuria   MS:  No joint pain or swelling.  No decreased range of motion.  No back pain.    Physical Exam  BP 118/70 (BP Location: Left Arm, Patient Position: Sitting, Cuff Size: Large)   Pulse 72   Temp 98 F (36.7 C) (Oral)   Ht '5\' 9"'$  (1.753 m)   Wt 254 lb 9.6 oz (115.5 kg)   SpO2 97%   BMI 37.60 kg/m   GEN: A/Ox3; pleasant , NAD, well nourished    HEENT:  /AT,  EACs-clear, TMs-wnl, NOSE-clear, THROAT-clear, no lesions, no postnasal drip or exudate noted.  Class 3 MP airway   NECK:  Supple w/ fair ROM; no JVD; normal carotid impulses w/o bruits; no thyromegaly or nodules palpated; no lymphadenopathy.    RESP  Clear  P & A; w/o, wheezes/ rales/ or rhonchi. no accessory muscle use, no dullness to percussion  CARD:  RRR, Gr 2-3 SM , no peripheral edema, pulses intact, no cyanosis or clubbing.  GI:   Soft & nt; nml bowel sounds; no organomegaly or masses detected.   Musco: Warm bil, no deformities or joint swelling noted.   Neuro: alert, no focal deficits noted.    Skin: Warm, no lesions or rashes      BNP No results found for: "BNP"  ProBNP No results found for: "PROBNP"  Imaging: No results found.       Latest Ref Rng & Units 05/17/2022    2:57 PM  PFT Results  FVC-Pre L 4.52  P  FVC-Predicted Pre % 97  P  FVC-Post L 4.48  P  FVC-Predicted Post % 96  P  Pre FEV1/FVC % % 75  P  Post FEV1/FCV % % 78  P  FEV1-Pre L 3.41  P   FEV1-Predicted Pre % 96  P  FEV1-Post L 3.50  P  DLCO uncorrected ml/min/mmHg 27.55  P  DLCO UNC% % 102  P  DLCO corrected ml/min/mmHg 27.55  P  DLCO COR %Predicted % 102  P  DLVA Predicted % 100  P  TLC L 7.48  P  TLC % Predicted % 110  P  RV % Predicted % 134  P    P Preliminary result    No results  found for: "NITRICOXIDE"      Assessment & Plan:   OSA (obstructive sleep apnea) Excellent control compliance on CPAP.  Continue on current settings no changes  Emphysema lung (HCC) Mild emphysema noted on CT scan.  Lung function testing today shows normal lung function with no airflow obstruction or restriction.  Diffusing capacity is normal.  Patient is asymptomatic.  No need for inhalers at this time.  Continue with lung cancer CT screening program yearly  Systolic murmur Known aortic valve stenosis.  Remains asymptomatic continue with follow-up with cardiology  Class 2 severe obesity due to excess calories with serious comorbidity and body mass index (BMI) of 37.0 to 37.9 in adult [facility]) Patient is doing well with weight loss.  Continue with a healthy diet and exercise plan     Rexene Edison, NP 05/17/2022

## 2022-05-17 NOTE — Assessment & Plan Note (Signed)
Mild emphysema noted on CT scan.  Lung function testing today shows normal lung function with no airflow obstruction or restriction.  Diffusing capacity is normal.  Patient is asymptomatic.  No need for inhalers at this time.  Continue with lung cancer CT screening program yearly

## 2022-05-17 NOTE — Assessment & Plan Note (Signed)
Excellent control compliance on CPAP.  Continue on current settings no changes

## 2022-05-17 NOTE — Progress Notes (Signed)
PFT done today. 

## 2022-05-17 NOTE — Assessment & Plan Note (Signed)
Known aortic valve stenosis.  Remains asymptomatic continue with follow-up with cardiology

## 2022-05-17 NOTE — Assessment & Plan Note (Signed)
Patient is doing well with weight loss.  Continue with a healthy diet and exercise plan

## 2022-05-18 NOTE — Progress Notes (Signed)
Reviewed and agree with assessment/plan.   Chesley Mires, MD [facility] Pulmonary/Critical Care 05/18/2022, 10:36 AM Pager:  [phone]

## 2022-05-19 ENCOUNTER — Other Ambulatory Visit: Payer: Self-pay | Admitting: Internal Medicine

## 2022-05-19 DIAGNOSIS — N1831 Chronic kidney disease, stage 3a: Secondary | ICD-10-CM

## 2022-06-01 ENCOUNTER — Ambulatory Visit
Admission: RE | Admit: 2022-06-01 | Discharge: 2022-06-01 | Disposition: A | Discharge status: Home / Self Care | Payer: BC Managed Care – PPO | Source: Ambulatory Visit | Attending: Internal Medicine | Admitting: Internal Medicine

## 2022-06-01 DIAGNOSIS — N1831 Chronic kidney disease, stage 3a: Secondary | ICD-10-CM

## 2022-06-02 NOTE — Progress Notes (Signed)
This encounter was created in error - please disregard.

## 2022-06-22 ENCOUNTER — Other Ambulatory Visit: Payer: Self-pay | Admitting: Acute Care

## 2022-06-22 DIAGNOSIS — Z87891 Personal history of nicotine dependence: Secondary | ICD-10-CM

## 2022-06-22 DIAGNOSIS — Z122 Encounter for screening for malignant neoplasm of respiratory organs: Secondary | ICD-10-CM

## 2022-07-08 NOTE — Progress Notes (Unsigned)
   Cardiology Clinic Note   Date: 07/08/2022 ID: Steven Sloan, DOB [birth year], MRN [id number]  Primary Cardiologist:  Sanda Klein, MD  Patient Profile    Steven Sloan is a 62 y.o. male who presents to the clinic today for follow-up of chronic cardiac conditions.  Past medical history significant for: Systolic murmur. Echo 04/02/2021: EF 60%.  Mild LVH.  Mild MR.  Aortic valve with indeterminate number of cusps, moderate calcification, mild AR, severe aortic valve stenosis with mean gradient 40 mmHg. Hypertension.   History of Present Illness    Steven Sloan was first evaluated by Dr. Sallyanne Kuster on 03/11/2021 for evaluation of cardiac murmur of increasing intensity at the request of Dr. Carlota Raspberry, PCP.  Patient denied any dizziness, presyncope, dyspnea   Today, patient ***   ROS: All other systems reviewed and are otherwise negative except as noted in History of Present Illness.  Studies Reviewed    ECG personally reviewed by me today: ***  No significant changes from ***  Risk Assessment/Calculations    {Does this patient have ATRIAL FIBRILLATION?:[phone]} No BP recorded.  {Refresh Note OR Click here to enter BP  :1}***        Physical Exam    VS:  There were no vitals taken for this visit. , BMI There is no height or weight on file to calculate BMI.  GEN: Well nourished, well developed, in no acute distress. Neck: No JVD or carotid bruits. Cardiac: *** RRR. No murmurs. No rubs or gallops.   Respiratory:  Respirations regular and unlabored. Clear to auscultation without rales, wheezing or rhonchi. GI: Soft, nontender, nondistended. Extremities: Radials/DP/PT 2+ and equal bilaterally. No clubbing or cyanosis. No edema ***  Skin: Warm and dry, no rash. Neuro: Strength intact.  Assessment & Plan   ***  Disposition: ***     {Are you ordering a CV Procedure (e.g. stress test, cath, DCCV, TEE, etc)?   Press F2        :[id number]}   Signed, Justice Britain.  Elizzie Westergard, DNP, NP-C

## 2022-07-11 ENCOUNTER — Encounter: Payer: Self-pay | Admitting: Student

## 2022-07-11 ENCOUNTER — Ambulatory Visit: Payer: BC Managed Care – PPO | Attending: Cardiovascular Disease | Admitting: Student

## 2022-07-11 VITALS — BP 124/70 | HR 58 | Ht 69.0 in | Wt 241.6 lb

## 2022-07-11 DIAGNOSIS — E782 Mixed hyperlipidemia: Secondary | ICD-10-CM

## 2022-07-11 DIAGNOSIS — I350 Nonrheumatic aortic (valve) stenosis: Secondary | ICD-10-CM

## 2022-07-11 DIAGNOSIS — I10 Essential (primary) hypertension: Secondary | ICD-10-CM

## 2022-07-11 NOTE — Patient Instructions (Signed)
Medication Instructions:  Your physician recommends that you continue on your current medications as directed. Please refer to the Current Medication list given to you today.  *If you need a refill on your cardiac medications before your next appointment, please call your pharmacy*   Lab Work: NONE If you have labs (blood work) drawn today and your tests are completely normal, you will receive your results only by: [place] (if you have MyChart) OR A paper copy in the mail If you have any lab test that is abnormal or we need to change your treatment, we will call you to review the results.   Testing/Procedures: Your physician has requested that you have an echocardiogram. Echocardiography is a painless test that uses sound waves to create images of your heart. It provides your doctor with information about the size and shape of your heart and how well your heart's chambers and valves are working. This procedure takes approximately one hour. There are no restrictions for this procedure. Please do NOT wear cologne, perfume, aftershave, or lotions (deodorant is allowed). Please arrive 15 minutes prior to your appointment time.    Follow-Up: At [facility], you and your health needs are our priority.  As part of our continuing mission to provide you with exceptional heart care, we have created designated Provider Care Teams.  These Care Teams include your primary Cardiologist (physician) and Advanced Practice Providers (APPs -  Physician Assistants and Nurse Practitioners) who all work together to provide you with the care you need, when you need it.  We recommend signing up for the patient portal called "MyChart".  Sign up information is provided on this After Visit Summary.  MyChart is used to connect with patients for Virtual Visits (Telemedicine).  Patients are able to view lab/test results, encounter notes, upcoming appointments, etc.  Non-urgent messages can be sent to your  provider as well.   To learn more about what you can do with MyChart, go to [email or web address].    Your next appointment:   6 month(s)  Provider:   Sanda Klein, MD

## 2022-07-25 ENCOUNTER — Ambulatory Visit: Payer: BC Managed Care – PPO | Admitting: Cardiovascular Disease

## 2022-08-10 ENCOUNTER — Ambulatory Visit (HOSPITAL_COMMUNITY): Payer: BC Managed Care – PPO | Attending: Internal Medicine

## 2022-08-10 DIAGNOSIS — I350 Nonrheumatic aortic (valve) stenosis: Secondary | ICD-10-CM | POA: Insufficient documentation

## 2022-08-10 HISTORY — DX: Nonrheumatic aortic (valve) stenosis: I35.0

## 2022-08-10 LAB — ECHOCARDIOGRAM COMPLETE
AR max vel: 0.93 cm2
AV Area VTI: 1.05 cm2
AV Area mean vel: 0.96 cm2
AV Mean grad: 45 mmHg
AV Peak grad: 87.5 mmHg
Ao pk vel: 4.68 m/s
Area-P 1/2: 3.21 cm2
Est EF: >75
P 1/2 time: 1007 msec
S' Lateral: 2.8 cm

## 2022-08-18 ENCOUNTER — Ambulatory Visit (HOSPITAL_BASED_OUTPATIENT_CLINIC_OR_DEPARTMENT_OTHER): Payer: BC Managed Care – PPO

## 2022-08-22 ENCOUNTER — Ambulatory Visit (HOSPITAL_BASED_OUTPATIENT_CLINIC_OR_DEPARTMENT_OTHER)
Admission: RE | Admit: 2022-08-22 | Discharge: 2022-08-22 | Disposition: A | Discharge status: Home / Self Care | Payer: BC Managed Care – PPO | Source: Ambulatory Visit | Attending: Family Medicine | Admitting: Family Medicine

## 2022-08-22 DIAGNOSIS — J439 Emphysema, unspecified: Secondary | ICD-10-CM | POA: Diagnosis not present

## 2022-08-22 DIAGNOSIS — I700 Atherosclerosis of aorta: Secondary | ICD-10-CM | POA: Diagnosis not present

## 2022-08-22 DIAGNOSIS — Z122 Encounter for screening for malignant neoplasm of respiratory organs: Secondary | ICD-10-CM | POA: Diagnosis not present

## 2022-08-22 DIAGNOSIS — I350 Nonrheumatic aortic (valve) stenosis: Secondary | ICD-10-CM | POA: Diagnosis not present

## 2022-08-22 DIAGNOSIS — Z87891 Personal history of nicotine dependence: Secondary | ICD-10-CM

## 2022-08-24 ENCOUNTER — Other Ambulatory Visit: Payer: Self-pay

## 2022-08-24 ENCOUNTER — Telehealth: Payer: Self-pay | Admitting: Acute Care

## 2022-08-24 DIAGNOSIS — Z87891 Personal history of nicotine dependence: Secondary | ICD-10-CM

## 2022-08-24 NOTE — Telephone Encounter (Signed)
Spoke with patient by phone, using two patient identifiers, to review results of LDCT. No suspicious findings for lung cancer.  Atherosclerosis and emphysema, as previously seen.  Noted were calcifications of the aortic valve.  Patient states he 'has known issues' with his heart valves and is followed by cardiology.  Copy of results faxed to PCP and cardiology ([place]) for review.  Order placed for annual 2025 LDCT.  Patient had no questions.

## 2022-09-15 ENCOUNTER — Other Ambulatory Visit: Payer: Self-pay | Admitting: Family Medicine

## 2022-09-15 DIAGNOSIS — I10 Essential (primary) hypertension: Secondary | ICD-10-CM

## 2022-10-14 ENCOUNTER — Encounter: Payer: Self-pay | Admitting: Gastroenterology

## 2022-10-17 ENCOUNTER — Ambulatory Visit (INDEPENDENT_AMBULATORY_CARE_PROVIDER_SITE_OTHER): Payer: BC Managed Care – PPO | Admitting: Family Medicine

## 2022-10-17 ENCOUNTER — Encounter: Payer: Self-pay | Admitting: Family Medicine

## 2022-10-17 VITALS — BP 132/80 | HR 78 | Temp 97.6°F | Ht 69.0 in | Wt 239.4 lb

## 2022-10-17 DIAGNOSIS — I700 Atherosclerosis of aorta: Secondary | ICD-10-CM

## 2022-10-17 DIAGNOSIS — K0889 Other specified disorders of teeth and supporting structures: Secondary | ICD-10-CM

## 2022-10-17 DIAGNOSIS — E669 Obesity, unspecified: Secondary | ICD-10-CM

## 2022-10-17 DIAGNOSIS — B351 Tinea unguium: Secondary | ICD-10-CM

## 2022-10-17 DIAGNOSIS — I10 Essential (primary) hypertension: Secondary | ICD-10-CM | POA: Diagnosis not present

## 2022-10-17 DIAGNOSIS — E66812 Obesity, class 2: Secondary | ICD-10-CM

## 2022-10-17 DIAGNOSIS — E785 Hyperlipidemia, unspecified: Secondary | ICD-10-CM | POA: Diagnosis not present

## 2022-10-17 DIAGNOSIS — Z6835 Body mass index (BMI) 35.0-35.9, adult: Secondary | ICD-10-CM

## 2022-10-17 MED ORDER — TERBINAFINE HCL 250 MG PO TABS: 250.0000 mg | ORAL_TABLET | Freq: Every day | ORAL | 1 refills | Status: DC

## 2022-10-17 MED ORDER — ATORVASTATIN CALCIUM 10 MG PO TABS: 10.0000 mg | ORAL_TABLET | Freq: Every day | ORAL | 1 refills | Status: DC

## 2022-10-17 NOTE — Patient Instructions (Addendum)
No med changes other than restart of lamisil. Recheck in 6 weeks.  Lab visit later this week - fasting.  Keep follow-up as planned with cardiology, including if any new symptoms. I do not see any sign of infection in the ear or ear canal at this time.  If any increasing pain in the mouth or tooth area, be seen by your dental provider right away. See information below regarding healthy weight and wellness to help with weight loss.  You should not need a referral but let me know if that is needed. Take care!  [facility]  [phone]

## 2022-10-17 NOTE — Progress Notes (Signed)
Subjective:  Patient ID: Steven Sloan, male    DOB: [birth year]  Age: 62 y.o. MRN: [id number]  CC:  Chief Complaint  Patient presents with   Medical Management of Chronic Issues    6/13 5pm 128/73, 6/14 2:45pm 118/63, doing well   Nail Problem    Pt notes still fighting nail fungus that he has had issues with over the past few years would like another round of treatment    Ear Problem    Pt notes infection in tooth on the Rt side of jaw but notes since infection started having ear pressure wants to ensure doe not look like infection spreading to the ear    Referral    Would like the referral to the dietician now as he is trying to lose weight and is anticipating surgery in the next 18 months     HPI Steven Sloan presents for  Follow-up with other concerns as above  Hypertension: Lisinopril 10 mg daily, also on Terazosin 1 mg daily. Home readings: 128/73, 118/63. Home machine in office similar to our readings.  Osa on cpap.  Severe aortic valve stenosis, but no CP, fatigue, DOE. Plan for possible repair in next 13mo to a year. Followed by Dr. Royann Shivers.  BP Readings from Last 3 Encounters:  10/17/22 132/80  07/11/22 124/70  05/17/22 118/70   Lab Results  Component Value Date   CREATININE 1.33 04/14/2022   Hyperlipidemia: Lipitor 10 mg daily, also takes aspirin - daily 81mg .   No new bleeding, no abd pain., no hx of PUD.  Lab Results  Component Value Date   CHOL 125 01/17/2022   HDL 57.30 01/17/2022   LDLCALC 55 01/17/2022   TRIG 65.0 01/17/2022   CHOLHDL 2 01/17/2022   Lab Results  Component Value Date   ALT 39 03/09/2022   AST 26 03/09/2022   ALKPHOS 50 03/09/2022   BILITOT 0.9 03/09/2022   Allergic rhinitis Azelastine nasal spray, Flonase nasal spray - as needed, working well.   Toenail discoloration/onychomycosis. Still having some issues, treated previously with Lamisil, last prescription last year.  Additionally had used topical ciclopirox.  Improving in  December with topical ciclopirox on the left great toenail halfway better at that time.  Right cleared. Left great toe looks similar - still using topical ciclopirox. Would like to try lamisil again. R great toe clear.  Prior elevation of lfts, normalized on recheck.  Lab Results  Component Value Date   ALT 39 03/09/2022   AST 26 03/09/2022   ALKPHOS 50 03/09/2022   BILITOT 0.9 03/09/2022    Right ear pain Has been treated a dental infection on upper R molar - tooth #2 - periodontal treatment - laser surgery for bacteria last Wednesday. 5 days ago. Some swelling upper gum, face area. Swelling near ear, slight soreness in canal, no d/c, no hearing loss.   Obesity Plan on surgery in the next 18 months  for possible heart valve replacement - aortic valve replacement, would like to have some weight loss prior to that time.  Weight down 2 pounds from March, up from 236 in July 2023. Does not want to start meds or surgery. Would like to meet with weight loss specialist.  Wt Readings from Last 3 Encounters:  10/17/22 239 lb 6.4 oz (108.6 kg)  08/22/22 240 lb (108.9 kg)  07/11/22 241 lb 9.6 oz (109.6 kg)   Body mass index is 35.35 kg/m. Lab Results  Component Value Date   HGBA1C 5.3 11/16/2020  History Patient Active Problem List   Diagnosis Date Noted   Emphysema lung (HCC) 01/13/2022   Systolic murmur 01/11/2019   Class 2 severe obesity due to excess calories with serious comorbidity and body mass index (BMI) of 37.0 to 37.9 in adult [facility]) 08/13/2018   Prostate cancer screening 06/07/2017   Internal hemorrhoid 06/07/2017   Visit for preventive health examination 07/26/2015   Quit smoking 07/01/2015   OSA (obstructive sleep apnea) 06/10/2015   Essential hypertension, benign 06/10/2015   Past Medical History:  Diagnosis Date   Allergy    Seasonal   Chicken pox    Heart murmur    Asymptomatic -- Patient endorses previous negative evaluation   Hypertension    Sleep apnea     CPAP nightly   Past Surgical History:  Procedure Laterality Date   COLONOSCOPY     NO PAST SURGERIES     WISDOM TOOTH EXTRACTION     No Known Allergies Prior to Admission medications   Medication Sig Start Date End Date Taking? Authorizing Provider  aspirin EC 81 MG tablet Take 81 mg by mouth daily.   Yes [provider]  atorvastatin (LIPITOR) 10 MG tablet Take 1 tablet (10 mg total) by mouth daily. 04/14/22  Yes Shade Flood, MD  Azelastine HCl 137 MCG/SPRAY SOLN PLACE 1-2 SPRAYS INTO BOTH NOSTRILS 2 (TWO) TIMES DAILY. USE IN EACH NOSTRIL AS DIRECTED 02/04/22  Yes Shade Flood, MD  Ciclopirox 0.77 % gel Apply 1 application  topically 2 (two) times daily. 01/17/22  Yes Shade Flood, MD  fluticasone [facility]) 50 MCG/ACT nasal spray USE 2 SPRAYS IN BOTH  NOSTRILS DAILY 01/11/22  Yes Shade Flood, MD  lisinopril (ZESTRIL) 10 MG tablet TAKE 1 TABLET BY MOUTH DAILY 09/16/22  Yes Shade Flood, MD  Multiple Vitamins-Minerals (CENTRUM ADULTS) TABS Take 1 tablet by mouth daily.   Yes [provider]  Omega-3 Fatty Acids (FISH OIL) 1200 MG CAPS Take 2 capsules by mouth daily.    Yes [provider]  psyllium (REGULOID) 0.52 g capsule Take 0.52 g by mouth daily.   Yes [provider]  terazosin (HYTRIN) 1 MG capsule TAKE 1 CAPSULE BY MOUTH AT  BEDTIME 09/16/22  Yes Shade Flood, MD   Social History   Socioeconomic History   Marital status: Married    Spouse name: Not on file   Number of children: Not on file   Years of education: Not on file   Highest education level: Master's degree (e.g., MA, MS, MEng, MEd, MSW, MBA)  Occupational History   Not on file  Tobacco Use   Smoking status: Former    Packs/day: 0.25    Years: 25.00    Additional pack years: 0.00    Total pack years: 6.25    Types: Cigarettes    Quit date: 05/18/2015    Years since quitting: 7.4   Smokeless tobacco: Never  Vaping Use   Vaping Use: Never used   Substance and Sexual Activity   Alcohol use: Yes    Alcohol/week: 2.0 - 4.0 standard drinks of alcohol    Types: 2 - 4 Standard drinks or equivalent per week    Comment: social   Drug use: Not Currently    Types: Marijuana   Sexual activity: Yes  Other Topics Concern   Not on file  Social History Narrative   Not on file   Social Determinants of Health   Financial Resource Strain: Low Risk  (  10/13/2022)   Overall Financial Resource Strain (CARDIA)    Difficulty of Paying Living Expenses: Not hard at all  Food Insecurity: No Food Insecurity (10/13/2022)   Hunger Vital Sign    Worried About Running Out of Food in the Last Year: Never true    Ran Out of Food in the Last Year: Never true  Transportation Needs: No Transportation Needs (10/13/2022)   PRAPARE - Administrator, Civil Service (Medical): No    Lack of Transportation (Non-Medical): No  Physical Activity: Sufficiently Active (10/13/2022)   Exercise Vital Sign    Days of Exercise per Week: 5 days    Minutes of Exercise per Session: 60 min  Stress: No Stress Concern Present (10/13/2022)   [employer] of Occupational Health - Occupational Stress Questionnaire    Feeling of Stress : Only a little  Social Connections: Socially Isolated (10/13/2022)   Social Connection and Isolation Panel [NHANES]    Frequency of Communication with Friends and Family: Once a week    Frequency of Social Gatherings with Friends and Family: Once a week    Attends Religious Services: Never    Database administrator or Organizations: No    Attends Engineer, structural: Not on file    Marital Status: Married  Catering manager Violence: Not on file    Review of Systems  Constitutional:  Negative for fatigue and unexpected weight change.  Eyes:  Negative for visual disturbance.  Respiratory:  Negative for cough, chest tightness and shortness of breath.   Cardiovascular:  Negative for chest pain, palpitations and leg  swelling.  Gastrointestinal:  Negative for abdominal pain and blood in stool.  Neurological:  Negative for dizziness, light-headedness and headaches.     Objective:   Vitals:   10/17/22 0936  BP: 132/80  Pulse: 78  Temp: 97.6 F (36.4 C)  TempSrc: Temporal  SpO2: 97%  Weight: 239 lb 6.4 oz (108.6 kg)  Height: 5\' 9"  (1.753 m)     Physical Exam Vitals reviewed.  Constitutional:      Appearance: He is well-developed.  HENT:     Head: Normocephalic and atraumatic.     Right Ear: Tympanic membrane, ear canal and external ear normal. There is no impacted cerumen.     Ears:     Comments: Pinna nontender, TMJ nontender, no focal tenderness or soft tissue swelling appreciated.  Pain-free opening and closing of jaw at TMJ.    Mouth/Throat:     Comments: Moist oral mucosa, no lesions or appreciable gum swelling noted. Neck:     Vascular: No carotid bruit or JVD.  Cardiovascular:     Rate and Rhythm: Normal rate and regular rhythm.     Heart sounds: Murmur (3/6 systolic.) heard.  Pulmonary:     Effort: Pulmonary effort is normal.     Breath sounds: Normal breath sounds. No rales.  Musculoskeletal:     Right lower leg: No edema.     Left lower leg: No edema.  Skin:    General: Skin is warm and dry.     Comments: Thickened discolored left great toenail, see photo  Neurological:     Mental Status: He is alert and oriented to person, place, and time.  Psychiatric:        Mood and Affect: Mood normal.        L great toe.  Assessment & Plan:  Steven Sloan is a 62 y.o. male . Essential hypertension, benign  -Stable with  current regimen, continue same.  Future labs planned, adjust meds accordingly.  Atherosclerosis of aorta (HCC) - Plan: atorvastatin (LIPITOR) 10 MG tablet Hyperlipidemia, unspecified hyperlipidemia type - Plan: atorvastatin (LIPITOR) 10 MG tablet, Comprehensive metabolic panel, Lipid panel  -Tolerating Lipitor, check updated labs, adjust meds  accordingly.  Refilled  Onychomycosis - Plan: Comprehensive metabolic panel, terbinafine (LAMISIL) 250 MG tablet  -Left great toe with persistent involvement, improvement previously on Lamisil.  Restart with LFTs now and again in 6 weeks, slight increased LFTs in the past.  Dental pain Followed by dentist, no appreciable face swelling or gum swelling on exam, ear without concerns.  RTC/ER precautions and follow-up with dentist as planned.  Obesity  -Declines meds or surgical treatment at this time but would like to meet with weight loss specialist.  Phone number provided for healthy weight and wellness.  Okay to refer if needed.  Meds ordered this encounter  Medications   atorvastatin (LIPITOR) 10 MG tablet    Sig: Take 1 tablet (10 mg total) by mouth daily.    Dispense:  90 tablet    Refill:  1   terbinafine (LAMISIL) 250 MG tablet    Sig: Take 1 tablet (250 mg total) by mouth daily.    Dispense:  90 tablet    Refill:  1   Patient Instructions  No med changes other than restart of lamisil. Recheck in 6 weeks.  Lab visit later this week - fasting.  Keep follow-up as planned with cardiology, including if any new symptoms. I do not see any sign of infection in the ear or ear canal at this time.  If any increasing pain in the mouth or tooth area, be seen by your dental provider right away. See information below regarding healthy weight and wellness to help with weight loss.  You should not need a referral but let me know if that is needed. Take care!  [facility]  [phone]      Signed,   Meredith Staggers, MD [facility], [facility] 10/17/22 9:46 AM

## 2022-10-19 ENCOUNTER — Other Ambulatory Visit (INDEPENDENT_AMBULATORY_CARE_PROVIDER_SITE_OTHER): Payer: BC Managed Care – PPO

## 2022-10-19 DIAGNOSIS — E785 Hyperlipidemia, unspecified: Secondary | ICD-10-CM

## 2022-10-19 DIAGNOSIS — B351 Tinea unguium: Secondary | ICD-10-CM

## 2022-10-19 LAB — LIPID PANEL
Cholesterol: 113 mg/dL (ref 0–200)
HDL: 45.9 mg/dL (ref >39.00)
LDL Cholesterol: 56 mg/dL (ref 0–99)
NonHDL: 67.12
Total CHOL/HDL Ratio: 2
Triglycerides: 57 mg/dL (ref 0.0–149.0)
VLDL: 11.4 mg/dL (ref 0.0–40.0)

## 2022-10-19 LAB — COMPREHENSIVE METABOLIC PANEL
ALT: 24 U/L (ref 0–53)
AST: 24 U/L (ref 0–37)
Albumin: 4.3 g/dL (ref 3.5–5.2)
Alkaline Phosphatase: 50 U/L (ref 39–117)
BUN: 28 mg/dL — ABNORMAL HIGH (ref 6–23)
CO2: 28 mEq/L (ref 19–32)
Calcium: 9.5 mg/dL (ref 8.4–10.5)
Chloride: 103 mEq/L (ref 96–112)
Creatinine, Ser: 1.36 mg/dL (ref 0.40–1.50)
GFR: 56.92 mL/min — ABNORMAL LOW (ref >60.00)
Glucose, Bld: 96 mg/dL (ref 70–99)
Potassium: 4.4 mEq/L (ref 3.5–5.1)
Sodium: 138 mEq/L (ref 135–145)
Total Bilirubin: 1 mg/dL (ref 0.2–1.2)
Total Protein: 7.5 g/dL (ref 6.0–8.3)

## 2022-12-06 ENCOUNTER — Encounter: Payer: Self-pay | Admitting: Gastroenterology

## 2022-12-06 ENCOUNTER — Ambulatory Visit (AMBULATORY_SURGERY_CENTER): Payer: BC Managed Care – PPO | Admitting: *Deleted

## 2022-12-06 VITALS — Ht 69.0 in | Wt 244.0 lb

## 2022-12-06 DIAGNOSIS — Z86010 Personal history of colonic polyps: Secondary | ICD-10-CM

## 2022-12-06 MED ORDER — NA SULFATE-K SULFATE-MG SULF 17.5-3.13-1.6 GM/177ML PO SOLN: 1.0000 | Freq: Once | ORAL | 0 refills | Status: AC

## 2022-12-06 NOTE — Progress Notes (Signed)
Pt's name and DOB verified at the beginning of the pre-visit.  Pt denies any difficulty with ambulating,sitting, laying down or rolling side to side Gave both [facility] main # and MD on call # prior to instructions.  No egg or soy allergy known to patient  No issues known to pt with past sedation with any surgeries or procedures Patient denies ever being intubated Pt has no issues moving head neck or swallowing No FH of Malignant Hyperthermia Pt is not on diet pills Pt is not on home 02  Pt is not on blood thinners  Pt denies issues with constipation  Pt is not on dialysis Pt denise any abnormal heart rhythms  Pt denies any upcoming cardiac testing Pt encouraged to use to use Singlecare or Goodrx to reduce cost  Patient's chart reviewed by Cathlyn Parsons CNRA prior to pre-visit and patient appropriate for the [facility].  Pre-visit completed and red dot placed by patient's name on their procedure day (on provider's schedule).  . Visit by phone Pt states weight is 244 lb Instructed pt why it is important to and  to call if they have any changes in health or new medications. Directed them to the # given and on instructions.   Pt states they will.  Instructions reviewed with pt and pt states understanding. Instructed to review again prior to procedure. Pt states they will.  Instructions sent by mail with coupon and by my chart

## 2022-12-07 ENCOUNTER — Ambulatory Visit (INDEPENDENT_AMBULATORY_CARE_PROVIDER_SITE_OTHER): Payer: BC Managed Care – PPO | Admitting: Family Medicine

## 2022-12-07 VITALS — BP 126/70 | HR 62 | Temp 98.2°F | Ht 69.0 in | Wt 245.4 lb

## 2022-12-07 DIAGNOSIS — Z5181 Encounter for therapeutic drug level monitoring: Secondary | ICD-10-CM

## 2022-12-07 DIAGNOSIS — F439 Reaction to severe stress, unspecified: Secondary | ICD-10-CM | POA: Diagnosis not present

## 2022-12-07 DIAGNOSIS — B351 Tinea unguium: Secondary | ICD-10-CM

## 2022-12-07 LAB — HEPATIC FUNCTION PANEL
ALT: 25 U/L (ref 0–53)
AST: 20 U/L (ref 0–37)
Albumin: 4.7 g/dL (ref 3.5–5.2)
Alkaline Phosphatase: 51 U/L (ref 39–117)
Bilirubin, Direct: 0.2 mg/dL (ref 0.0–0.3)
Total Bilirubin: 1 mg/dL (ref 0.2–1.2)
Total Protein: 7.8 g/dL (ref 6.0–8.3)

## 2022-12-07 NOTE — Patient Instructions (Addendum)
No change in Lamisil or topical treatment for nails for now. I will check labs.   See info on stress management below. I do think it would be helpful to meet with therapist to continue discussions on how to manage your various stressors and demands at this time. Continue to look at you day to day to see if there are areas where others can help, but I understand amount of help is limited. Continue to discuss systems and assistance at work as that will be even more important when you do have to be out for your own health. We can also look at some intermittent time off if needed to help with your home responsibilities with your spouse's health issues.   Here are a few options for counseling, but EAP at work may be a great start.   [facility]:  [phone]  [facility]:  [phone]  [facility] [phone]   Managing Stress, Adult Feeling a certain amount of stress is normal. Stress helps our body and mind get ready to deal with the demands of life. Stress hormones can motivate you to do well at work and meet your responsibilities. But severe or long-term (chronic) stress can affect your mental and physical health. Chronic stress puts you at higher risk for: Anxiety and depression. Other health problems such as digestive problems, muscle aches, heart disease, high blood pressure, and stroke. What are the causes? Common causes of stress include: Demands from work, such as deadlines, feeling overworked, or having long hours. Pressures at home, such as money issues, disagreements with a spouse, or parenting issues. Pressures from major life changes, such as divorce, moving, loss of a loved one, or chronic illness. You may be at higher risk for stress-related problems if you: Do not get enough sleep. Are in poor health. Do not have emotional support. Have a mental health disorder such as anxiety or depression. How to recognize  stress Stress can make you: Have trouble sleeping. Feel sad, anxious, irritable, or overwhelmed. Lose your appetite. Overeat or want to eat unhealthy foods. Want to use drugs or alcohol. Stress can also cause physical symptoms, such as: Sore, tense muscles, especially in the shoulders and neck. Headaches. Trouble breathing. A faster heart rate. Stomach pain, nausea, or vomiting. Diarrhea or constipation. Trouble concentrating. Follow these instructions at home: Eating and drinking Eat a healthy diet. This includes: Eating foods that are high in fiber, such as beans, whole grains, and fresh fruits and vegetables. Limiting foods that are high in fat and processed sugars, such as fried or sweet foods. Do not skip meals or overeat. Drink enough fluid to keep your urine pale yellow. Alcohol use Do not drink alcohol if: Your health care provider tells you not to drink. You are pregnant, may be pregnant, or are planning to become pregnant. Drinking alcohol is a way some people try to ease their stress. This can be dangerous, so if you drink alcohol: Limit how much you have to: 0-1 drink a day for women. 0-2 drinks a day for men. Know how much alcohol is in your drink. In the [place] one drink equals one 12 oz bottle of beer (355 mL), one 5 oz glass of wine (148 mL), or one 1 oz glass of hard liquor (44 mL). Activity  Include 30 minutes of exercise in your daily schedule. Exercise is a good stress reducer. Include time in your day for an activity that you find relaxing. Try taking a walk, going  on a bike ride, reading a book, or listening to music. Schedule your time in a way that lowers stress, and keep a regular schedule. Focus on doing what is most important to get done. Lifestyle Identify the source of your stress and your reaction to it. See a therapist who can help you change unhelpful reactions. When there are stressful events: Talk about them with family, friends, or  coworkers. Try to think realistically about stressful events and not ignore them or overreact. Try to find the positives in a stressful situation and not focus on the negatives. Cut back on responsibilities at work and home, if possible. Ask for help from friends or family members if you need it. Find ways to manage stress, such as: Mindfulness, meditation, or deep breathing. Yoga or tai chi. Progressive muscle relaxation. Spending time in nature. Doing art, playing music, or reading. Making time for fun activities. Spending time with family and friends. Get support from family, friends, or spiritual resources. General instructions Get enough sleep. Try to go to sleep and get up at about the same time every day. Take over-the-counter and prescription medicines only as told by your health care provider. Do not use any products that contain nicotine or tobacco. These products include cigarettes, chewing tobacco, and vaping devices, such as e-cigarettes. If you need help quitting, ask your health care provider. Do not use drugs or smoke to deal with stress. Keep all follow-up visits. This is important. Where to find support Talk with your health care provider about stress management or finding a support group. Find a therapist to work with you on your stress management techniques. Where to find more information [employer] on Mental Illness: www.nami.org American Psychological Association: [employer].ca Contact a health care provider if: Your stress symptoms get worse. You are unable to manage your stress at home. You are struggling to stop using drugs or alcohol. Get help right away if: You may be a danger to yourself or others. You have any thoughts of death or suicide. Get help right awayif you feel like you may hurt yourself or others, or have thoughts about taking your own life. Go to your nearest emergency room or: Call 911. Call the National Suicide Prevention Lifeline at  828-854-0416 or 988 in the [place] This is open 24 hours a day. Text the Crisis Text Line at 424-504-2786. Summary Feeling a certain amount of stress is normal, but severe or long-term (chronic) stress can affect your mental and physical health. Chronic stress can put you at higher risk for anxiety, depression, and other health problems such as digestive problems, muscle aches, heart disease, high blood pressure, and stroke. You may be at higher risk for stress-related problems if you do not get enough sleep, are in poor health, lack emotional support, or have a mental health disorder such as anxiety or depression. Identify the source of your stress and your reaction to it. Try talking about stressful events with family, friends, or coworkers, finding a coping method, or getting support from spiritual resources. If you need more help, talk with your health care provider about finding a support group or a mental health therapist. This information is not intended to replace advice given to you by your health care provider. Make sure you discuss any questions you have with your health care provider. Document Revised: 11/12/2020 Document Reviewed: 11/10/2020 Elsevier Patient Education  2024 [employer].

## 2022-12-07 NOTE — Progress Notes (Unsigned)
Subjective:  Patient ID: Steven Sloan, male    DOB: 1960-11-21  Age: 62 y.o. MRN: [id number]  CC:  Chief Complaint  Patient presents with   Nail Problem    Pt reports doing better there is one area that seems to be slower than the others to heal    Anxiety    Pt wife was recently dx with breast cancer and he is feeling a lot of stress due to this as well as his own upcoming surgery expressed understanding if needed to make a separate appt for these concerns     HPI [name] presents for   Onychomycosis: Follow-up from June.  Prior use of topical ciclopirox, right toenail discoloration he cleared, persistent left great toe discoloration with prior improvement on Lamisil.  Restarted Lamisil 250 mg daily at his June visit with planned close monitoring of LFTs as elevation in the past. Still applying ciclopirox BID.  Possible slight improvement since last visit - not sure it is improving in inside corner. No abd pain, n/v/yellowing of skin/eyes.   Lab Results  Component Value Date   ALT 24 10/19/2022   AST 24 10/19/2022   ALKPHOS 50 10/19/2022   BILITOT 1.0 10/19/2022    Obesity: Discussed in June, and declined any medication or surgical treatment options at that time but was amenable to meeting with weight loss specialist, phone number provided for healthy weight and wellness. In next 12 months may need surgery for possible heart valve replacement for aortic valve stenosis, plan for weight loss prior to that time. Weight up 5 pounds since that visit. Has not called weight mgt specialist. No change in diet/exercise. Increased stressors, recent vacation.   Body mass index is 36.24 kg/m. Wt Readings from Last 3 Encounters:  12/07/22 245 lb 6.4 oz (111.3 kg)  12/06/22 244 lb (110.7 kg)  10/17/22 239 lb 6.4 oz (108.6 kg)   Situational stress Wife diagnosed with breast cancer few months ago, lumpectomy, breast reconstruction, plan for radiation. She also has some knee issues  - unable to have knee replacement d/t weight, and not able to help at home since 2017.  More responsibilities fall on him at home. More work with full time job, increased work demands. Has discussed some of these issues with his employer, but difficult as 2 experienced employees have left and figuring out things from those that have left. Employer is understanding.  Daughter on spectrum. 56 yo, with function around 9-10yo. 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 His parents are about 1.5 hrs away. In laws in town not in sufficient health to help significantly, but some small help only. Does have cleaner for home and lawn care. Overwhelmed with current stressors, and impending stress - breast cancer diagnosis in spouse few months ago.  Has not met with therapist recently - marriage therapy. .  Has talked with his parent. Some arguments with family at times.  No SI.  Alcohol: not daily, on vacation, or Friday or Saturday - up to a 6 pack.  Walking 1 hour per day.      12/07/2022    8:35 AM 10/17/2022    9:22 AM 04/14/2022   10:51 AM 01/17/2022   11:03 AM 11/25/2021    8:51 AM  Depression screen PHQ 2/9  Decreased Interest 0 0 0 0 0  Down, Depressed, Hopeless 1 0 0 0 0  PHQ - 2 Score 1 0 0 0 0  Altered sleeping 0 0 0 0   Tired, decreased energy 0  0 0 0   Change in appetite 1 0 0 0   Feeling bad or failure about yourself  1 0 0 0   Trouble concentrating 1 0 0 0   Moving slowly or fidgety/restless 0 0 0 0   Suicidal thoughts 0 0 0 0   PHQ-9 Score 4 0 0 0   Difficult doing work/chores    Not difficult at all        History Patient Active Problem List   Diagnosis Date Noted   Emphysema lung (HCC) 01/13/2022   Systolic murmur 01/11/2019   Class 2 severe obesity due to excess calories with serious comorbidity and body mass index (BMI) of 37.0 to 37.9 in adult [facility]) 08/13/2018   Prostate cancer screening 06/07/2017   Internal hemorrhoid 06/07/2017   Visit for preventive health examination 07/26/2015   Quit  smoking 07/01/2015   OSA (obstructive sleep apnea) 06/10/2015   Essential hypertension, benign 06/10/2015   Past Medical History:  Diagnosis Date   Allergy    Seasonal   Chicken pox    Heart murmur    Asymptomatic -- Patient endorses previous negative evaluation   Hypertension    Sleep apnea    CPAP nightly   Past Surgical History:  Procedure Laterality Date   COLONOSCOPY     WISDOM TOOTH EXTRACTION     No Known Allergies Prior to Admission medications   Medication Sig Start Date End Date Taking? Authorizing Provider  aspirin EC 81 MG tablet Take 81 mg by mouth daily.   Yes [provider]  atorvastatin (LIPITOR) 10 MG tablet Take 1 tablet (10 mg total) by mouth daily. 10/17/22  Yes Shade Flood, MD  Azelastine HCl 137 MCG/SPRAY SOLN PLACE 1-2 SPRAYS INTO BOTH NOSTRILS 2 (TWO) TIMES DAILY. USE IN EACH NOSTRIL AS DIRECTED 02/04/22  Yes Shade Flood, MD  Ciclopirox 0.77 % gel Apply 1 application  topically 2 (two) times daily. 01/17/22  Yes Shade Flood, MD  fluticasone [facility]) 50 MCG/ACT nasal spray USE 2 SPRAYS IN BOTH  NOSTRILS DAILY 01/11/22  Yes Shade Flood, MD  lisinopril (ZESTRIL) 10 MG tablet TAKE 1 TABLET BY MOUTH DAILY 09/16/22  Yes Shade Flood, MD  psyllium (REGULOID) 0.52 g capsule Take 0.52 g by mouth daily.   Yes [provider]  terazosin (HYTRIN) 1 MG capsule TAKE 1 CAPSULE BY MOUTH AT  BEDTIME 09/16/22  Yes Shade Flood, MD  terbinafine (LAMISIL) 250 MG tablet Take 1 tablet (250 mg total) by mouth daily. 10/17/22  Yes Shade Flood, MD  Multiple Vitamins-Minerals (CENTRUM ADULTS) TABS Take 1 tablet by mouth daily. Patient not taking: Reported on 12/06/2022    [provider]  Omega-3 Fatty Acids (FISH OIL) 1200 MG CAPS Take 2 capsules by mouth daily.  Patient not taking: Reported on 12/06/2022    [provider]   Social History   Socioeconomic History   Marital status: Married    Spouse name: Not  on file   Number of children: Not on file   Years of education: Not on file   Highest education level: Master's degree (e.g., MA, MS, MEng, MEd, MSW, MBA)  Occupational History   Not on file  Tobacco Use   Smoking status: Former    Current packs/day: 0.00    Average packs/day: 0.3 packs/day for 25.0 years (6.3 ttl pk-yrs)    Types: Cigarettes    Start date: 05/17/1990    Quit date: 05/18/2015  Years since quitting: 7.5   Smokeless tobacco: Never  Vaping Use   Vaping status: Never Used  Substance and Sexual Activity   Alcohol use: Yes    Alcohol/week: 2.0 - 4.0 standard drinks of alcohol    Types: 2 - 4 Standard drinks or equivalent per week    Comment: social   Drug use: Not Currently    Types: Marijuana   Sexual activity: Yes  Other Topics Concern   Not on file  Social History Narrative   Not on file   Social Determinants of Health   Financial Resource Strain: Low Risk  (10/13/2022)   Overall Financial Resource Strain (CARDIA)    Difficulty of Paying Living Expenses: Not hard at all  Food Insecurity: No Food Insecurity (10/13/2022)   Hunger Vital Sign    Worried About Running Out of Food in the Last Year: Never true    Ran Out of Food in the Last Year: Never true  Transportation Needs: No Transportation Needs (10/13/2022)   PRAPARE - Administrator, Civil Service (Medical): No    Lack of Transportation (Non-Medical): No  Physical Activity: Sufficiently Active (10/13/2022)   Exercise Vital Sign    Days of Exercise per Week: 5 days    Minutes of Exercise per Session: 60 min  Stress: No Stress Concern Present (10/13/2022)   [employer] of Occupational Health - Occupational Stress Questionnaire    Feeling of Stress : Only a little  Social Connections: Socially Isolated (10/13/2022)   Social Connection and Isolation Panel [NHANES]    Frequency of Communication with Friends and Family: Once a week    Frequency of Social Gatherings with Friends and Family:  Once a week    Attends Religious Services: Never    Database administrator or Organizations: No    Attends Engineer, structural: Not on file    Marital Status: Married  Catering manager Violence: Not on file    Review of Systems  Per HPI.  Objective:   Vitals:   12/07/22 0956  BP: 126/70  Pulse: 62  Temp: 98.2 F (36.8 C)  TempSrc: Temporal  SpO2: 97%  Weight: 245 lb 6.4 oz (111.3 kg)  Height: 5\' 9"  (1.753 m)     Physical Exam Vitals reviewed.  Constitutional:      General: He is not in acute distress.    Appearance: Normal appearance. He is well-developed.  HENT:     Head: Normocephalic and atraumatic.  Cardiovascular:     Rate and Rhythm: Normal rate.  Pulmonary:     Effort: Pulmonary effort is normal.  Skin:    Comments: Left great toenail, discoloration, thickening, distal half to three quarters, see photo.  No surrounding skin erythema or wounds.  Neurological:     Mental Status: He is alert and oriented to person, place, and time.  Psychiatric:        Mood and Affect: Mood normal.        55 minutes spent during visit, including chart review, counseling regarding stressors, anxiety/mood symptoms and assimilation of information, exam, discussion of plan, and chart completion.   Assessment & Plan:  Izea Brensinger is a 62 y.o. male . Onychomycosis - Plan: Hepatic function panel  -Stable, appears to have some possible slight improvement in affected nail.  Will continue ciclopirox topical, Lamisil, check LFTs. Medication monitoring encounter - Plan: Hepatic function panel  Situational stress  -Situational stressors with work, home demands, and impending concerns of his own  health and possible surgery in the future.  Component of adjustment disorder due to these stressors, increase stressors.  Feels overwhelmed.  Recommended meeting with EAP program or therapist, decided against medication at this time.  Handout given on stress and stress management.   We also discussed ways to potentially have some help with his day-to-day responsibilities, as well as with work responsibilities.  This may be discussed in further detail with therapist, as well as his employer.  Option of intermittent time off if needed for his various responsibilities, and can complete FMLA paperwork if that is needed.  1 month virtual visit follow-up.  No orders of the defined types were placed in this encounter.  Patient Instructions  No change in Lamisil or topical treatment for nails for now. I will check labs.   See info on stress management below. I do think it would be helpful to meet with therapist to continue discussions on how to manage your various stressors and demands at this time. Continue to look at you day to day to see if there are areas where others can help, but I understand amount of help is limited. Continue to discuss systems and assistance at work as that will be even more important when you do have to be out for your own health. We can also look at some intermittent time off if needed to help with your home responsibilities with your spouse's health issues.   Here are a few options for counseling, but EAP at work may be a great start.   [facility]:  [phone]  [facility]:  [phone]  [facility] [phone]   Managing Stress, Adult Feeling a certain amount of stress is normal. Stress helps our body and mind get ready to deal with the demands of life. Stress hormones can motivate you to do well at work and meet your responsibilities. But severe or long-term (chronic) stress can affect your mental and physical health. Chronic stress puts you at higher risk for: Anxiety and depression. Other health problems such as digestive problems, muscle aches, heart disease, high blood pressure, and stroke. What are the causes? Common causes of stress include: Demands from work, such as  deadlines, feeling overworked, or having long hours. Pressures at home, such as money issues, disagreements with a spouse, or parenting issues. Pressures from major life changes, such as divorce, moving, loss of a loved one, or chronic illness. You may be at higher risk for stress-related problems if you: Do not get enough sleep. Are in poor health. Do not have emotional support. Have a mental health disorder such as anxiety or depression. How to recognize stress Stress can make you: Have trouble sleeping. Feel sad, anxious, irritable, or overwhelmed. Lose your appetite. Overeat or want to eat unhealthy foods. Want to use drugs or alcohol. Stress can also cause physical symptoms, such as: Sore, tense muscles, especially in the shoulders and neck. Headaches. Trouble breathing. A faster heart rate. Stomach pain, nausea, or vomiting. Diarrhea or constipation. Trouble concentrating. Follow these instructions at home: Eating and drinking Eat a healthy diet. This includes: Eating foods that are high in fiber, such as beans, whole grains, and fresh fruits and vegetables. Limiting foods that are high in fat and processed sugars, such as fried or sweet foods. Do not skip meals or overeat. Drink enough fluid to keep your urine pale yellow. Alcohol use Do not drink alcohol if: Your health care provider tells you not to drink. You are pregnant,  may be pregnant, or are planning to become pregnant. Drinking alcohol is a way some people try to ease their stress. This can be dangerous, so if you drink alcohol: Limit how much you have to: 0-1 drink a day for women. 0-2 drinks a day for men. Know how much alcohol is in your drink. In the [place] one drink equals one 12 oz bottle of beer (355 mL), one 5 oz glass of wine (148 mL), or one 1 oz glass of hard liquor (44 mL). Activity  Include 30 minutes of exercise in your daily schedule. Exercise is a good stress reducer. Include time in your day  for an activity that you find relaxing. Try taking a walk, going on a bike ride, reading a book, or listening to music. Schedule your time in a way that lowers stress, and keep a regular schedule. Focus on doing what is most important to get done. Lifestyle Identify the source of your stress and your reaction to it. See a therapist who can help you change unhelpful reactions. When there are stressful events: Talk about them with family, friends, or coworkers. Try to think realistically about stressful events and not ignore them or overreact. Try to find the positives in a stressful situation and not focus on the negatives. Cut back on responsibilities at work and home, if possible. Ask for help from friends or family members if you need it. Find ways to manage stress, such as: Mindfulness, meditation, or deep breathing. Yoga or tai chi. Progressive muscle relaxation. Spending time in nature. Doing art, playing music, or reading. Making time for fun activities. Spending time with family and friends. Get support from family, friends, or spiritual resources. General instructions Get enough sleep. Try to go to sleep and get up at about the same time every day. Take over-the-counter and prescription medicines only as told by your health care provider. Do not use any products that contain nicotine or tobacco. These products include cigarettes, chewing tobacco, and vaping devices, such as e-cigarettes. If you need help quitting, ask your health care provider. Do not use drugs or smoke to deal with stress. Keep all follow-up visits. This is important. Where to find support Talk with your health care provider about stress management or finding a support group. Find a therapist to work with you on your stress management techniques. Where to find more information [employer] on Mental Illness: www.nami.org American Psychological Association: [employer]ice[employer].ca Contact a health care provider  if: Your stress symptoms get worse. You are unable to manage your stress at home. You are struggling to stop using drugs or alcohol. Get help right away if: You may be a danger to yourself or others. You have any thoughts of death or suicide. Get help right awayif you feel like you may hurt yourself or others, or have thoughts about taking your own life. Go to your nearest emergency room or: Call 911. Call the National Suicide Prevention Lifeline at 339-008-5505 or 988 in the [place] This is open 24 hours a day. Text the Crisis Text Line at (805)577-8713. Summary Feeling a certain amount of stress is normal, but severe or long-term (chronic) stress can affect your mental and physical health. Chronic stress can put you at higher risk for anxiety, depression, and other health problems such as digestive problems, muscle aches, heart disease, high blood pressure, and stroke. You may be at higher risk for stress-related problems if you do not get enough sleep, are in poor health, lack emotional support,  or have a mental health disorder such as anxiety or depression. Identify the source of your stress and your reaction to it. Try talking about stressful events with family, friends, or coworkers, finding a coping method, or getting support from spiritual resources. If you need more help, talk with your health care provider about finding a support group or a mental health therapist. This information is not intended to replace advice given to you by your health care provider. Make sure you discuss any questions you have with your health care provider. Document Revised: 11/12/2020 Document Reviewed: 11/10/2020 Elsevier Patient Education  2024 Elsevier Inc.      Signed,   Meredith Staggers, MD [facility], [facility] 12/07/22 10:17 AM

## 2022-12-08 ENCOUNTER — Encounter: Payer: Self-pay | Admitting: Family Medicine

## 2022-12-08 ENCOUNTER — Other Ambulatory Visit: Payer: Self-pay | Admitting: Family Medicine

## 2022-12-08 DIAGNOSIS — B351 Tinea unguium: Secondary | ICD-10-CM

## 2022-12-12 ENCOUNTER — Encounter: Payer: Self-pay | Admitting: Cardiovascular Disease

## 2022-12-17 ENCOUNTER — Encounter: Payer: Self-pay | Admitting: Certified Registered Nurse Anesthetist

## 2022-12-18 ENCOUNTER — Telehealth: Payer: Self-pay | Admitting: *Deleted

## 2022-12-18 NOTE — Telephone Encounter (Signed)
Dr. Meridee Score,  This pt is scheduled with you on 8/20.  He has severe AS so does not qualify for care at [facility].  Best regards,  Cathlyn Parsons

## 2022-12-19 ENCOUNTER — Telehealth: Payer: Self-pay

## 2022-12-19 NOTE — Telephone Encounter (Signed)
Called patient to cancel procedure for tomorrow, was unable to talk to patient but left a voicemail for him to call back before starting any of his prep.

## 2022-12-20 ENCOUNTER — Encounter: Payer: BC Managed Care – PPO | Admitting: Gastroenterology

## 2022-12-20 ENCOUNTER — Encounter: Payer: Self-pay | Admitting: Cardiovascular Disease

## 2023-01-09 ENCOUNTER — Telehealth (INDEPENDENT_AMBULATORY_CARE_PROVIDER_SITE_OTHER): Payer: BC Managed Care – PPO | Admitting: Family Medicine

## 2023-01-09 ENCOUNTER — Encounter: Payer: Self-pay | Admitting: Family Medicine

## 2023-01-09 DIAGNOSIS — F439 Reaction to severe stress, unspecified: Secondary | ICD-10-CM | POA: Diagnosis not present

## 2023-01-09 NOTE — Patient Instructions (Addendum)
Good talking with you today. Sounds like you are managing stressors fairly well right now including recent stressors but follow-up with therapist as planned.  I think that will be helpful.  No new medications for now.  Follow-up me in 6 weeks, but let me know if I can help you sooner.  Take care

## 2023-01-09 NOTE — Progress Notes (Signed)
Virtual Visit via Video Note  I connected with [name] on 01/09/23 at 9:01 AM by a video enabled telemedicine application and verified that I am speaking with the correct person using two identifiers.  Patient location: home, by self.  My location: office - [place].    I discussed the limitations, risks, security and privacy concerns of performing an evaluation and management service by telephone and the availability of in person appointments. I also discussed with the patient that there may be a patient responsible charge related to this service. The patient expressed understanding and agreed to proceed, consent obtained  Chief complaint:  Chief Complaint  Patient presents with   Follow-up    Pt is here to F/U on stress. Pt reports stress has been stress due to family.(Daughter and Wife)     History of Present Illness: Steven Sloan is a 62 y.o. male  Anxiety, situational stress: Follow-up from August 7 visit.  Discussed significant stressors at that time with work, home 100 manage and possible surgery in the future with his own health.  Adjustment disorder due to stressors and felt overwhelmed.  Discussed meeting with EAP program therapist or independent therapist and medication was discussed but decided against medication at that time.  Information provided on handout regarding stress and stress management and discussed ways to help with his day-to-day responsibilities and work responsibilities.  I also discussed options for time off if needed and FMLA as option.  More drama past 2 weeks. Situation with daughter - special needs, with high functioning autism. On swim team - she went on high school portal profile, she sent her info to colleges, but created a fake profile with other swimmer, up to a Zoom call with a college. Other family upset - possible legal action, has met with their own attorney, but at this point they would like to move own - dtr will be off current  swim team, on new swim team.  Wife is still going through her health issues but she is getting better, able to do a little more activity on her own.  Has appt with Serafina Mitchell on 9/17 for therapy.  Sleeping ok. Walking 2 miles, 2 times per day.  Cardiology appt this week.       12/07/2022    8:35 AM 10/17/2022    9:22 AM 04/14/2022   10:51 AM 01/17/2022   11:03 AM 11/25/2021    8:51 AM  Depression screen PHQ 2/9  Decreased Interest 0 0 0 0 0  Down, Depressed, Hopeless 1 0 0 0 0  PHQ - 2 Score 1 0 0 0 0  Altered sleeping 0 0 0 0   Tired, decreased energy 0 0 0 0   Change in appetite 1 0 0 0   Feeling bad or failure about yourself  1 0 0 0   Trouble concentrating 1 0 0 0   Moving slowly or fidgety/restless 0 0 0 0   Suicidal thoughts 0 0 0 0   PHQ-9 Score 4 0 0 0   Difficult doing work/chores    Not difficult at all       12/07/2022    8:35 AM 10/17/2022    9:22 AM  GAD 7 : Generalized Anxiety Score  Nervous, Anxious, on Edge 2 0  Control/stop worrying 2 0  Worry too much - different things 2 0  Trouble relaxing 0 0  Restless 0 0  Easily annoyed or irritable 2 0  Afraid - awful  might happen 2 0  Total GAD 7 Score 10 0        Patient Active Problem List   Diagnosis Date Noted   Emphysema lung (HCC) 01/13/2022   Systolic murmur 01/11/2019   Class 2 severe obesity due to excess calories with serious comorbidity and body mass index (BMI) of 37.0 to 37.9 in adult [facility]) 08/13/2018   Prostate cancer screening 06/07/2017   Internal hemorrhoid 06/07/2017   Visit for preventive health examination 07/26/2015   Quit smoking 07/01/2015   OSA (obstructive sleep apnea) 06/10/2015   Essential hypertension, benign 06/10/2015   Past Medical History:  Diagnosis Date   Allergy    Seasonal   Aortic valve stenosis 08/10/2022   severe   Chicken pox    Heart murmur    Asymptomatic -- Patient endorses previous negative evaluation   Hypertension    Sleep apnea    CPAP nightly    Past Surgical History:  Procedure Laterality Date   COLONOSCOPY     WISDOM TOOTH EXTRACTION     No Known Allergies Prior to Admission medications   Medication Sig Start Date End Date Taking? Authorizing Provider  aspirin EC 81 MG tablet Take 81 mg by mouth daily.   Yes [provider]  atorvastatin (LIPITOR) 10 MG tablet Take 1 tablet (10 mg total) by mouth daily. 10/17/22  Yes Shade Flood, MD  Azelastine HCl 137 MCG/SPRAY SOLN PLACE 1-2 SPRAYS INTO BOTH NOSTRILS 2 (TWO) TIMES DAILY. USE IN EACH NOSTRIL AS DIRECTED 02/04/22  Yes Shade Flood, MD  Ciclopirox 0.77 % gel APPLY TO AFFECTED AREA TWICE A DAY 12/08/22  Yes Shade Flood, MD  fluticasone [facility]) 50 MCG/ACT nasal spray USE 2 SPRAYS IN BOTH  NOSTRILS DAILY 01/11/22  Yes Shade Flood, MD  lisinopril (ZESTRIL) 10 MG tablet TAKE 1 TABLET BY MOUTH DAILY 09/16/22  Yes Shade Flood, MD  psyllium (REGULOID) 0.52 g capsule Take 0.52 g by mouth daily.   Yes [provider]  terazosin (HYTRIN) 1 MG capsule TAKE 1 CAPSULE BY MOUTH AT  BEDTIME 09/16/22  Yes Shade Flood, MD  terbinafine (LAMISIL) 250 MG tablet Take 1 tablet (250 mg total) by mouth daily. 10/17/22  Yes Shade Flood, MD  Multiple Vitamins-Minerals (CENTRUM ADULTS) TABS Take 1 tablet by mouth daily. Patient not taking: Reported on 12/06/2022    [provider]  Omega-3 Fatty Acids (FISH OIL) 1200 MG CAPS Take 2 capsules by mouth daily.  Patient not taking: Reported on 12/06/2022    [provider]   Social History   Socioeconomic History   Marital status: Married    Spouse name: Not on file   Number of children: Not on file   Years of education: Not on file   Highest education level: Master's degree (e.g., MA, MS, MEng, MEd, MSW, MBA)  Occupational History   Not on file  Tobacco Use   Smoking status: Former    Current packs/day: 0.00    Average packs/day: 0.3 packs/day for 25.0 years (6.3 ttl pk-yrs)     Types: Cigarettes    Start date: 05/17/1990    Quit date: 05/18/2015    Years since quitting: 7.6   Smokeless tobacco: Never  Vaping Use   Vaping status: Never Used  Substance and Sexual Activity   Alcohol use: Yes    Alcohol/week: 2.0 - 4.0 standard drinks of alcohol    Types: 2 - 4 Standard drinks or equivalent per week  Comment: social   Drug use: Not Currently    Types: Marijuana   Sexual activity: Yes  Other Topics Concern   Not on file  Social History Narrative   Not on file   Social Determinants of Health   Financial Resource Strain: Low Risk  (10/13/2022)   Overall Financial Resource Strain (CARDIA)    Difficulty of Paying Living Expenses: Not hard at all  Food Insecurity: No Food Insecurity (10/13/2022)   Hunger Vital Sign    Worried About Running Out of Food in the Last Year: Never true    Ran Out of Food in the Last Year: Never true  Transportation Needs: No Transportation Needs (10/13/2022)   PRAPARE - Administrator, Civil Service (Medical): No    Lack of Transportation (Non-Medical): No  Physical Activity: Sufficiently Active (10/13/2022)   Exercise Vital Sign    Days of Exercise per Week: 5 days    Minutes of Exercise per Session: 60 min  Stress: No Stress Concern Present (10/13/2022)   [employer] of Occupational Health - Occupational Stress Questionnaire    Feeling of Stress : Only a little  Social Connections: Socially Isolated (10/13/2022)   Social Connection and Isolation Panel [NHANES]    Frequency of Communication with Friends and Family: Once a week    Frequency of Social Gatherings with Friends and Family: Once a week    Attends Religious Services: Never    Database administrator or Organizations: No    Attends Engineer, structural: Not on file    Marital Status: Married  Catering manager Violence: Not on file    Observations/Objective: Nontoxic appearance on video, speaking full sentences without respiratory distress.   Good eye contact euthymic mood, no apparent psychomotor agitation.  Does not appear to be responding to internal stimuli.  No SI/HI.  All questions answered with understanding plan expressed  Assessment and Plan: Situational stress Overall stable, has appointment with therapist in approximately 8 days.  Recent additional stressor as above but appears to be heading in the right direction and handling okay at this time.  Continue low intensity exercise, at the discretion of his cardiologist.  Hold on medications for now.  6-week follow-up.  Sooner if needed.  Follow Up Instructions: 6-week follow-up.   I discussed the assessment and treatment plan with the patient. The patient was provided an opportunity to ask questions and all were answered. The patient agreed with the plan and demonstrated an understanding of the instructions.   The patient was advised to call back or seek an in-person evaluation if the symptoms worsen or if the condition fails to improve as anticipated.   Shade Flood, MD

## 2023-01-10 NOTE — Progress Notes (Signed)
Cardiology Office Note:    Date:  01/13/2023   ID:  Steven Sloan, DOB 04-09-1961, MRN [id number]  PCP:  Shade Flood, MD   [facility] Providers Cardiologist:  Thurmon Fair, MD     Referring MD: Shade Flood, MD   Chief Complaint  Patient presents with   Cardiac Valve Problem      History of Present Illness:    Steven Sloan is a 62 y.o. male with a hx of severe aortic stenosis (asymptomatic to date) and hypertension.  He continues to be asymptomatic.  He walks on his treadmill at 2.5 miles an hour on an incline of 8% for 1 hour every day and then walks 2 miles in the afternoon with his dog (small).  He has not had any angina, dizziness/syncope or shortness of breath with activity.  He has not had to curtail his exercise regimen.  He has had occasional vasovagal like episodes in the past including when we first discussed his diagnosis of severe aortic stenosis in the office.  He has not had any recent events.  He has known about the fact that he has a murmur for at least the last 26 years, and his primary care provider told him last year that had become more intense, which led to any workup.  He has an excellent lipid profile on atorvastatin with an LDL cholesterol of 56.  He does not have diabetes mellitus.  He does not smoke.  He is now seeing Dr. Signe Colt in the nephrology clinic.  His most recent creatinine was 1.36.   Past Medical History:  Diagnosis Date   Allergy    Seasonal   Aortic valve stenosis 08/10/2022   severe   Chicken pox    Heart murmur    Asymptomatic -- Patient endorses previous negative evaluation   Hypertension    Sleep apnea    CPAP nightly    Past Surgical History:  Procedure Laterality Date   COLONOSCOPY     WISDOM TOOTH EXTRACTION      Current Medications: Current Meds  Medication Sig   aspirin EC 81 MG tablet Take 81 mg by mouth daily.   atorvastatin (LIPITOR) 10 MG tablet Take 1 tablet (10 mg total) by mouth daily.    Azelastine HCl 137 MCG/SPRAY SOLN PLACE 1-2 SPRAYS INTO BOTH NOSTRILS 2 (TWO) TIMES DAILY. USE IN EACH NOSTRIL AS DIRECTED   Ciclopirox 0.77 % gel APPLY TO AFFECTED AREA TWICE A DAY   fluticasone (FLONASE) 50 MCG/ACT nasal spray USE 2 SPRAYS IN BOTH  NOSTRILS DAILY   lisinopril (ZESTRIL) 10 MG tablet TAKE 1 TABLET BY MOUTH DAILY   psyllium (REGULOID) 0.52 g capsule Take 0.52 g by mouth daily.   terazosin (HYTRIN) 1 MG capsule TAKE 1 CAPSULE BY MOUTH AT  BEDTIME   terbinafine (LAMISIL) 250 MG tablet Take 1 tablet (250 mg total) by mouth daily.     Allergies:   Patient has no known allergies.   Social History   Socioeconomic History   Marital status: Married    Spouse name: Not on file   Number of children: Not on file   Years of education: Not on file   Highest education level: Master's degree (e.g., MA, MS, MEng, MEd, MSW, MBA)  Occupational History   Not on file  Tobacco Use   Smoking status: Former    Current packs/day: 0.00    Average packs/day: 0.3 packs/day for 25.0 years (6.3 ttl pk-yrs)    Types: Cigarettes  Start date: 05/17/1990    Quit date: 05/18/2015    Years since quitting: 7.6   Smokeless tobacco: Never  Vaping Use   Vaping status: Never Used  Substance and Sexual Activity   Alcohol use: Yes    Alcohol/week: 2.0 - 4.0 standard drinks of alcohol    Types: 2 - 4 Standard drinks or equivalent per week    Comment: social   Drug use: Not Currently    Types: Marijuana   Sexual activity: Yes  Other Topics Concern   Not on file  Social History Narrative   Not on file   Social Determinants of Health   Financial Resource Strain: Low Risk  (10/13/2022)   Overall Financial Resource Strain (CARDIA)    Difficulty of Paying Living Expenses: Not hard at all  Food Insecurity: No Food Insecurity (10/13/2022)   Hunger Vital Sign    Worried About Running Out of Food in the Last Year: Never true    Ran Out of Food in the Last Year: Never true  Transportation Needs: No  Transportation Needs (10/13/2022)   PRAPARE - Administrator, Civil Service (Medical): No    Lack of Transportation (Non-Medical): No  Physical Activity: Sufficiently Active (10/13/2022)   Exercise Vital Sign    Days of Exercise per Week: 5 days    Minutes of Exercise per Session: 60 min  Stress: No Stress Concern Present (10/13/2022)   [employer] of Occupational Health - Occupational Stress Questionnaire    Feeling of Stress : Only a little  Social Connections: Socially Isolated (10/13/2022)   Social Connection and Isolation Panel [NHANES]    Frequency of Communication with Friends and Family: Once a week    Frequency of Social Gatherings with Friends and Family: Once a week    Attends Religious Services: Never    Database administrator or Organizations: No    Attends Engineer, structural: Not on file    Marital Status: Married     Family History: The patient's family history includes Alcohol abuse in his sister; Cancer (age of onset: 84) in his maternal grandmother; Heart attack (age of onset: 23) in his father and paternal uncle; Heart attack (age of onset: 19) in his paternal grandfather; Heart disease in his father. There is no history of Colon cancer, Colon polyps, Esophageal cancer, Rectal cancer, or Stomach cancer. His paternal uncle needed a "pig valve"  ROS:   Please see the history of present illness.     All other systems reviewed and are negative.  EKGs/Labs/Other Studies Reviewed:    The following studies were reviewed today: Echocardiogram 08/10/2022   1. Left ventricular ejection fraction, by estimation, is >75%. The left  ventricle has hyperdynamic function. The left ventricle has no regional  wall motion abnormalities. There is mild left ventricular hypertrophy.  Left ventricular diastolic parameters  were normal.   2. Right ventricular systolic function is normal. The right ventricular  size is normal. Tricuspid regurgitation signal is  inadequate for assessing  PA pressure.   3. The mitral valve is abnormal. Mild mitral valve regurgitation.   4. The aortic valve has an indeterminant number of cusps. There is severe  calcifcation of the aortic valve. Aortic valve regurgitation is moderate.  Severe aortic valve stenosis. Aortic valve area, by VTI measures 1.05 cm.  Aortic valve mean gradient  measures 45.0 mmHg. Aortic valve Vmax measures 4.68 m/s. Peak gradient  87.5 mmHg, DI 0.23.   5. The inferior vena  cava is normal in size with greater than 50%  respiratory variability, suggesting right atrial pressure of 3 mmHg.   Comparison(s): Changes from prior study are noted. 04/02/2021: LVEF 60%,  severe AS, mean gradient 40 mmHg.    EKG:  EKG is ordered today.  The ekg ordered today demonstrates mild sinus bradycardia, left anterior fascicular block, no ischemic repolarization abnormalities.  Uncorrected QT 400 ms  Recent Labs: 10/19/2022: BUN 28; Creatinine, Ser 1.36; Potassium 4.4; Sodium 138 12/07/2022: ALT 25  Recent Lipid Panel    Component Value Date/Time   CHOL 113 10/19/2022 0958   TRIG 57.0 10/19/2022 0958   HDL 45.90 10/19/2022 0958   CHOLHDL 2 10/19/2022 0958   VLDL 11.4 10/19/2022 0958   LDLCALC 56 10/19/2022 0958     Risk Assessment/Calculations:           Physical Exam:    VS:  BP 134/72 (BP Location: Left Arm, Patient Position: Sitting, Cuff Size: Large)   Pulse (!) 53   Ht 5\' 9"  (1.753 m)   Wt 241 lb 9.6 oz (109.6 kg)   SpO2 94%   BMI 35.68 kg/m     Wt Readings from Last 3 Encounters:  01/11/23 241 lb 9.6 oz (109.6 kg)  12/07/22 245 lb 6.4 oz (111.3 kg)  12/06/22 244 lb (110.7 kg)      General: Alert, oriented x3, no distress, severely obese. Head: no evidence of trauma, PERRL, EOMI, no exophtalmos or lid lag, no myxedema, no xanthelasma; normal ears, nose and oropharynx Neck: normal jugular venous pulsations and no hepatojugular reflux; brisk carotid pulses without delay.  Carotid  bruits radiating from the chest bilaterally Chest: clear to auscultation, no signs of consolidation by percussion or palpation, normal fremitus, symmetrical and full respiratory excursions Cardiovascular: normal position and quality of the apical impulse, regular rhythm, normal first heart sound and barely audible aortic component of the second heart sound, 3/6 mid peaking harsh aortic ejection murmur, no diastolic murmurs, rubs or gallops Abdomen: no tenderness or distention, no masses by palpation, no abnormal pulsatility or arterial bruits, normal bowel sounds, no hepatosplenomegaly Extremities: no clubbing, cyanosis or edema; 2+ radial, ulnar and brachial pulses bilaterally; 2+ right femoral, posterior tibial and dorsalis pedis pulses; 2+ left femoral, posterior tibial and dorsalis pedis pulses; no subclavian or femoral bruits Neurological: grossly nonfocal Psych: Normal mood and affect   ASSESSMENT:    1. Nonrheumatic aortic valve stenosis   2. Essential hypertension, benign   3. Class 2 severe obesity due to excess calories with serious comorbidity and body mass index (BMI) of 37.0 to 37.9 in adult [facility])     PLAN:    In order of problems listed above:  Severe aortic stenosis: He clearly has severe aortic stenosis and based on his echo findings in April it is highly likely that he will soon develop exertional symptoms.  Will increase the frequency of his evaluation to every 6 months.  Nevertheless he is quite active and remains asymptomatic to date.  Most likely has a bicuspid aortic valve (although I cannot hear a distinct systolic click).  Aortic stenosis treatment can be delayed until he developed symptoms (exertional angina, exertional dyspnea, exertional syncope). Work-up before aortic valve replacement will include a cardiac catheterization to look for coronary stenoses or coronary anomalies and also an evaluation of his aortic root for any evidence of aneurysm formation.  He will be a  good candidate for a SAVR with bioprosthesis, may be followed by a TAVR valve  in valve as a second procedure when he is in his 37s.   HTN: Good blood pressure control. Obesity: He does not have diabetes mellitus and does not appear to have any of the symptoms of obstructive sleep apnea.  He seems reasonably fit.  Once again advised him to try to lose some weight, since this will make his recovery from heart surgery easier.  Avoid anaerobic/straining type exercise such as weightlifting. Probable vasovagal presyncope: He has not had full-blown syncope.  No recent episodes of near syncope.       Medication Adjustments/Labs and Tests Ordered: Current medicines are reviewed at length with the patient today.  Concerns regarding medicines are outlined above.  Orders Placed This Encounter  Procedures   EKG 12-Lead   ECHOCARDIOGRAM COMPLETE   No orders of the defined types were placed in this encounter.    Patient Instructions  Medication Instructions:  No changes *If you need a refill on your cardiac medications before your next appointment, please call your pharmacy*  Testing/Procedures: Your physician has requested that you have an echocardiogram in October. Echocardiography is a painless test that uses sound waves to create images of your heart. It provides your doctor with information about the size and shape of your heart and how well your heart's chambers and valves are working. This procedure takes approximately one hour. There are no restrictions for this procedure. Please do NOT wear cologne, perfume, aftershave, or lotions (deodorant is allowed). Please arrive 15 minutes prior to your appointment time.    Follow-Up: At [facility], you and your health needs are our priority.  As part of our continuing mission to provide you with exceptional heart care, we have created designated Provider Care Teams.  These Care Teams include your primary Cardiologist (physician) and  Advanced Practice Providers (APPs -  Physician Assistants and Nurse Practitioners) who all work together to provide you with the care you need, when you need it.  We recommend signing up for the patient portal called "MyChart".  Sign up information is provided on this After Visit Summary.  MyChart is used to connect with patients for Virtual Visits (Telemedicine).  Patients are able to view lab/test results, encounter notes, upcoming appointments, etc.  Non-urgent messages can be sent to your provider as well.   To learn more about what you can do with MyChart, go to [email or web address].    Your next appointment:   1 year(s)  Provider:   Thurmon Fair, MD        Signed, Thurmon Fair, MD  01/13/2023 7:21 PM     [facility]

## 2023-01-11 ENCOUNTER — Ambulatory Visit: Payer: BC Managed Care – PPO | Attending: Cardiovascular Disease | Admitting: Cardiovascular Disease

## 2023-01-11 ENCOUNTER — Encounter: Payer: Self-pay | Admitting: Cardiovascular Disease

## 2023-01-11 VITALS — BP 134/72 | HR 53 | Ht 69.0 in | Wt 241.6 lb

## 2023-01-11 DIAGNOSIS — Z6837 Body mass index (BMI) 37.0-37.9, adult: Secondary | ICD-10-CM

## 2023-01-11 DIAGNOSIS — I350 Nonrheumatic aortic (valve) stenosis: Secondary | ICD-10-CM

## 2023-01-11 DIAGNOSIS — I10 Essential (primary) hypertension: Secondary | ICD-10-CM

## 2023-01-11 NOTE — Patient Instructions (Signed)
Medication Instructions:  No changes *If you need a refill on your cardiac medications before your next appointment, please call your pharmacy*  Testing/Procedures: Your physician has requested that you have an echocardiogram in October. Echocardiography is a painless test that uses sound waves to create images of your heart. It provides your doctor with information about the size and shape of your heart and how well your heart's chambers and valves are working. This procedure takes approximately one hour. There are no restrictions for this procedure. Please do NOT wear cologne, perfume, aftershave, or lotions (deodorant is allowed). Please arrive 15 minutes prior to your appointment time.    Follow-Up: At [facility], you and your health needs are our priority.  As part of our continuing mission to provide you with exceptional heart care, we have created designated Provider Care Teams.  These Care Teams include your primary Cardiologist (physician) and Advanced Practice Providers (APPs -  Physician Assistants and Nurse Practitioners) who all work together to provide you with the care you need, when you need it.  We recommend signing up for the patient portal called "MyChart".  Sign up information is provided on this After Visit Summary.  MyChart is used to connect with patients for Virtual Visits (Telemedicine).  Patients are able to view lab/test results, encounter notes, upcoming appointments, etc.  Non-urgent messages can be sent to your provider as well.   To learn more about what you can do with MyChart, go to [email or web address].    Your next appointment:   1 year(s)  Provider:   Thurmon Fair, MD

## 2023-01-13 ENCOUNTER — Encounter: Payer: Self-pay | Admitting: Cardiovascular Disease

## 2023-01-17 ENCOUNTER — Ambulatory Visit: Payer: BC Managed Care – PPO | Admitting: Behavioral Health

## 2023-01-17 ENCOUNTER — Encounter: Payer: Self-pay | Admitting: Behavioral Health

## 2023-01-17 DIAGNOSIS — F331 Major depressive disorder, recurrent, moderate: Secondary | ICD-10-CM | POA: Diagnosis not present

## 2023-01-17 DIAGNOSIS — F4323 Adjustment disorder with mixed anxiety and depressed mood: Secondary | ICD-10-CM

## 2023-01-17 DIAGNOSIS — F411 Generalized anxiety disorder: Secondary | ICD-10-CM | POA: Diagnosis not present

## 2023-01-17 NOTE — Progress Notes (Signed)
Steven Sloan, Steven Sloan

## 2023-01-17 NOTE — Progress Notes (Signed)
[facility] Behavioral Health Counselor Initial Assessment  Name: Steven Sloan Date: 01/17/2023 MRN: [id number] DOB: 13-Apr-1961 PCP: Shade Flood, MD  Time spent: 65 minutes, 11 AM until 12:05 PM spent face-to-face with the patient in the outpatient therapist office.  Guardian/Payee: Self  Paperwork requested: No   Reason for Visit /Presenting Problem: Patient feeling overwhelmed anxious and depressed.  Steven Sloan is a 62 year old male who is married to his wife Steven Sloan for 30 years.  He has one 38 year old daughter named Steven Sloan.  He reports only a fair relationship with his wife.  He reports a good relationship with his daughter but she is diagnosed on the autism spectrum.  Intellectually she is in a good place but emotionally he says she acts more as if she is 73 or 61 years old.  There are multiple things which are concerning to the patient creating significant anxiety.  He reports being extremely tearful and more irritable.  He said he feels completely overwhelmed.  Multiple things drive his anxiety and depression.  He found out in his 30s that he had a heart murmur which she was born with but only recently meeting with a cardiologist to find out that he has to have an aortic valve replaced.  It is very rare and that only 1% of the population have something like this.  2 of 3 aortic valves are fused together and starting not to function as well as they can.  His cardiologist expects that within a year he will have to have surgery but if he does not within 5 years he could die.  He is aware of symptoms to look for such as heaviness in the chest difficulty breathing and getting tired very easily.  So far he is not experiencing any of those but his doctor telling him it would probably be within the year has driven his anxiety up.  He is doing all that he can in walking at least 2 miles every day.  He has lost about 45 pounds but has plateaued and is trying to find ways to lose weight again but  acknowledges that he is a stress and/or emotional eater.  He has been through difficult things before and quit smoking after smoking for 35 years.  He has lost significant amount of weight but acknowledges the need to lose more.  The surgery will be open heart bypass surgery and knows will be a difficult surgery.  There will be a second surgery later.  The first surgery fixes the valve and gives him about 15 years and the second surgery but had about 20 years to that.  He knows 2 people which are distant family members who have been through situations like this.  1 went into surgery but it could not be fixed and he was the last person to speak to this particular family member before he died.  Patient says he can still see that in his mind Zide at times pictures himself in that situation in a negative way.  He his wife's stepgrandmother had stenosis and she went into heart surgery being told if she would be fine and some plaque broke loose and surgery and she died on the operating table.  He said those things keep running through his head.  He has a history of heart issues as well as strokes in his family with his father having had double bypass surgery.  He acknowledges facing mortality is a struggle for him and a fear that he is having a hard time  shaking.  He believes in God and believes in heaven but says there are things here that are concerning to him.  His wife has had multiple health issues.  She had breast cancer and had to have treatment as well as breast reduction.  That was in May of this year.  Thankfully she is doing well with that.  She has 2 bad knees 1 of which was fixed finally in 2017 but it took 3 years to go through all the process with insurance to get her replaced.  She then tore the meniscus in her other knee but was told they would not replace it until she lost 100 pounds.  She has lost about 69 pounds but the knee that is bad she cannot stand for a lengthy amount of time and because of  the breast cancer and surgery cannot do much otherwise.  He said he now is the primary caregiver and housekeeper.  He is concerned about the timing that might take place between him having to have heart surgery and his wife needing knee replacement surgery.  There is concerned because he is someone we need to check on him at least every 30 minutes or so for the first 30 days after surgery and knows that his wife is distracted with her health issues and her daughter cannot be consistent in caring for the patient.  He acknowledges that he and his wife have never had a great marriage and they stay together primarily for their daughter.  His daughter is on the autism spectrum.  She is high functioning and that she does fairly well intellectually but she has the executive functioning level of about a 58 year old.  He says she cannot keep friends and makes them angry or goes into a monologue that just turned people off.  During COVID she somehow got onto a Muslim chat room and was convinced that she wanted to convert to Islam.  That has changed but he said they have had to put a lot of checks and balances in place with her for electronics.  One thing that she is always enjoyed doing is swimming and she was on her school swim team there was a team mate who was a senior and the patient's daughter felt that teammates should get a full scholarship to a Division I team so she for reasons that the patient does not understand acted on that.  She created a quarrel in which she was downloading her teammates swim times and statistics into a portal that college coaches could see.  She went as far setting up a virtual interview posing as her team made for the interview.  That college coach knew their high school coach and reached out to him who in turn reach out to her teammate.  That teammates parents are Wynetta Emery incredibly angry at the patient and his family.  There may be some legal action in the teammates father wants to have the  patient pay for a mediator he will go through all the patient's electronics to see if there is anything else going on.  They have sensed removed their daughter from the school swim team and having her [facility] compet[facility] on a regular basis because that is one of the few things that she will do socially.  He is not sure how all of that will turn out.  The teammates mom is a cardiac surgery nurse and would be at the practice for the patient will have to have surgery.  The teammates father is a Teacher, early years/pre by trade but works monitoring several stores as a Teacher, early years/pre.  With all of the health issue the patient says he is going through the wall deaths of how to care for his daughter if they are not able to more available.  There is a trust fund set up but there are other factors that they have to side to make sure things are covered.  The patient indicates that he becomes tearful very easily and irritable.  We validated those feelings in light of all that he is going on and the fact that he feels overwhelmed.  I introduced a couple of very brief breathing exercises as well as a grounding exercise and we will introduce more in the next session.  He does contract for safety having no thoughts of hurting himself or anyone else.  Mental Status Exam: Appearance:   Well Groomed     Behavior:  Appropriate  Motor:  Normal  Speech/Language:   Normal Rate  Affect:  Appropriate  Mood:  anxious and depressed  Thought process:  normal  Thought content:    WNL  Sensory/Perceptual disturbances:    WNL  Orientation:  oriented to person, place, time/date, situation, day of week, month of year, and year  Attention:  Good  Concentration:  Good  Memory:  WNL  Fund of knowledge:   Good  Insight:    Good  Judgment:   Good  Impulse Control:  Good    Reported Symptoms: Anxiety/depression  Risk Assessment: Danger to Self:  No Self-injurious Behavior: No Danger to Others: No Duty to Warn:no Physical  Aggression / Violence:No  Access to Firearms a concern: No  Gang Involvement:No  Patient / guardian was educated about steps to take if suicide or homicide risk level increases between visits: n/a While future psychiatric events cannot be accurately predicted, the patient does not currently require acute inpatient psychiatric care and does not currently meet [facility] involuntary commitment criteria.  Substance Abuse History: Current substance abuse: No     Past Psychiatric History:   Previous psychological history is significant for anxiety and depression Outpatient Providers:PCP  Established Patient Office Visit  Subjective   Patient ID: Grimm Bassani, male    DOB: 1960/06/16  Age: 62 y.o. MRN: [id number]  Chief Complaint  Patient presents with   Anxiety   Depression    HPI    ROS    Objective:            French Ana, [facility]  History of Psych Hospitalization:  None reported Psychological Testing:  n/a    Abuse History:  Victim of: No.,  None reported    Report needed: No. Victim of Neglect:No. Perpetrator of none reported  Witness / Exposure to Domestic Violence: None reported  Protective Services Involvement: No  Witness to [employer] Violence:   None reported  Family History:  Family History  Problem Relation Age of Onset   Heart attack Father 68   Heart disease Father    Alcohol abuse Sister    Heart attack Paternal Uncle 38   Cancer Maternal Grandmother 104       Unsure   Heart attack Paternal Grandfather 37   Colon cancer Neg Hx    Colon polyps Neg Hx    Esophageal cancer Neg Hx    Rectal cancer Neg Hx    Stomach cancer Neg Hx     Living situation: the patient lives with their family  Sexual Orientation: Straight  Relationship Status: married  Name of spouse / other: Steven Sloan If a parent, number of children / ages: The patient has a 29 year old daughter named Conservation officer, [employer]: spouse Some family and friends  Surveyor, quantity  Stress:  No   Income/Employment/Disability: Employment  Financial planner: No   Educational History: Education: Risk manager: Protestant  Any cultural differences that may affect / interfere with treatment:  not applicable   Recreation/Hobbies: Time with family  Stressors: Health problems   Marital or family conflict   Traumatic event    Strengths: Supportive Relationships, Spirituality, and Hopefulness  Barriers:     Legal History: Pending legal issue / charges: The patient has no significant history of legal issues. History of legal issue / charges:  n/a  Medical History/Surgical History: reviewed Past Medical History:  Diagnosis Date   Allergy    Seasonal   Aortic valve stenosis 08/10/2022   severe   Chicken pox    Heart murmur    Asymptomatic -- Patient endorses previous negative evaluation   Hypertension    Sleep apnea    CPAP nightly    Past Surgical History:  Procedure Laterality Date   COLONOSCOPY     WISDOM TOOTH EXTRACTION      Medications: Current Outpatient Medications  Medication Sig Dispense Refill   aspirin EC 81 MG tablet Take 81 mg by mouth daily.     atorvastatin (LIPITOR) 10 MG tablet Take 1 tablet (10 mg total) by mouth daily. 90 tablet 1   Azelastine HCl 137 MCG/SPRAY SOLN PLACE 1-2 SPRAYS INTO BOTH NOSTRILS 2 (TWO) TIMES DAILY. USE IN EACH NOSTRIL AS DIRECTED 90 mL 3   Ciclopirox 0.77 % gel APPLY TO AFFECTED AREA TWICE A DAY 100 g 0   fluticasone (FLONASE) 50 MCG/ACT nasal spray USE 2 SPRAYS IN BOTH  NOSTRILS DAILY 48 g 11   lisinopril (ZESTRIL) 10 MG tablet TAKE 1 TABLET BY MOUTH DAILY 90 tablet 3   psyllium (REGULOID) 0.52 g capsule Take 0.52 g by mouth daily.     terazosin (HYTRIN) 1 MG capsule TAKE 1 CAPSULE BY MOUTH AT  BEDTIME 90 capsule 3   terbinafine (LAMISIL) 250 MG tablet Take 1 tablet (250 mg total) by mouth daily. 90 tablet 1   No current facility-administered medications for this  visit.    No Known Allergies  Diagnoses:  Generalized anxiety disorder, major depressive disorder, recurrent, moderate  Plan of Care: I will meet with the patient every 2 weeks primarily via video but in office as possible.   French Ana, [facility]

## 2023-02-02 ENCOUNTER — Ambulatory Visit: Payer: BC Managed Care – PPO | Admitting: Behavioral Health

## 2023-02-02 ENCOUNTER — Encounter: Payer: Self-pay | Admitting: Behavioral Health

## 2023-02-02 DIAGNOSIS — F411 Generalized anxiety disorder: Secondary | ICD-10-CM | POA: Diagnosis not present

## 2023-02-02 DIAGNOSIS — F330 Major depressive disorder, recurrent, mild: Secondary | ICD-10-CM

## 2023-02-02 NOTE — Progress Notes (Signed)
                Yurika Pereda M Shanzay Hepworth, LCMHC 

## 2023-02-02 NOTE — Progress Notes (Signed)
[facility] Counselor/Therapist Progress Note  Patient ID: Steven Sloan, MRN: [id number],    Date: 02/02/2023  Time Spent: 58 minutes, 9:04 AM until 10:02 AM. This session was held via video teletherapy. The patient consented to the video teletherapy and was located in his home office during this session. He is aware it is the responsibility of the patient to secure confidentiality on his end of the session. The provider was in a private home office for the duration of this session.    Treatment Type: Individual Therapy  Reported Symptoms: Anxiety, stress, depression  Mental Status Exam: Appearance:  Well Groomed     Behavior: Appropriate  Motor: Normal  Speech/Language:  Normal Rate  Affect: Appropriate  Mood: normal  Thought process: normal  Thought content:   WNL  Sensory/Perceptual disturbances:   WNL  Orientation: oriented to person, place, time/date, situation, day of week, month of year, and year  Attention: Good  Concentration: Good  Memory: WNL  Fund of knowledge:  Good  Insight:   Good  Judgment:  Good  Impulse Control: Good   Risk Assessment: Danger to Self:  No Self-injurious Behavior: No Danger to Others: No Duty to Warn:no Physical Aggression / Violence:No  Access to Firearms a concern: No  Gang Involvement:No   Subjective: I reviewed information gleaned from the initial session with the patient and reviewed treatment goals with him to which she agreed.  A formal treatment plan is introduced in this documentation.  Since our last session there has been some resolution of some of the situations creating anxiety for him.  The situation with his daughter interviewing as someone else with her college coach is for now fairly quiet.  He says that he and his family have done everything they can to be compliant including taking his daughter from the swim team and put her on the swim team at the [facility] [employer] where she was doing well.  The other family has not  taken any action otherwise.  His wife is now gone to the weight where she can have knee replacement and that is scheduled for just before Thanksgiving of this year.  That has taken away the anxiety of the possible crossover of her surgery and his possible heart surgery.  He is not noticing any consistent symptoms which would indicate that he is progressing closer to surgery although he knows it probably will be within the next year.  He does report an undercurrent of anticipatory anxiety of when the symptoms might become too much.  We talked about keeping a log of his symptoms so that he can see how many days he goes without any symptoms or only has mild symptoms as a way to reinforce the fact that he is doing what he can with self-care and a way of getting the anticipatory anxiety out of his thought process.  He had done something previous with his blood pressure and like that idea.  We also talked about a grounding exercise so that he can be more observant of his thoughts and use that to better challenge anxiety.  He also struggles with emotional eating even if he is not hungry.  He recognizes its connection to stress but is having difficulty breaking the cycle.  He likes anything that is sweet salty crunchy etc.  We talked about the sensor reconnection so that but also looked at the underlying issues for snacking even when he knows he is not hungry.  We talked about challenging the thoughts of feeling  like he needs that to alleviate anxiety and finding some things to replace that with including a little Fidget device he has on his desk.  We talked about the cognitive approach and introduce gradual reduction therapy by cutting out one snack per day for the next couple of weeks and replacing that by drinking water, using the fidget device, getting up and moving away from the desk and not going to the kitchen.  He has been practicing the relaxation breathing with some success and encouraged use of the grounding  exercises.  We will introduce a more mindfulness type exercises in the next session.  He does contract for safety having no thoughts of hurting himself or anyone else.  Interventions: Cognitive Behavioral Therapy and Dialectical Behavioral Therapy Diagnosis:Generalized anxiety disorder  Plan: I will meet with the patient every 2 to 3 weeks. Treatment plan: Goals are to reduce the patient's anxiety and some mild depression primarily related to medical condition but also other family factors by at least 50% with a target date of July 31, 2023.  Goals for minimizing depression are for the patient to have less sadness as indicated by his report and PHQ-9 scores, have improved mood and return to a healthier level of functioning.  We will look at any other causes for depression in addition to those stated above.  We will explore how depression is experienced by him, use cognitive behavioral therapy to explore and replace unhealthy thought and behavior patterns contributing to depression as well as encouraging the sharing of feelings related to causes and symptoms of depression.  We will introduce coping skills for managing depressive symptoms.  Goals for reducing anxiety include improving his ability to better manage stress and anxiety symptoms, identify causes for anxiety and introduce ways to lower it including resolving core conflicts contributing to the anxiety.  Finally a goal for the patient will be to manage thoughts and worrisome thinking contributing to feelings of anxiety.  Interventions include providing education about anxiety to help him understand his causes and triggers, facilitate problem solution skills and coping skills for managing anxiety.  We will also use cognitive behavioral therapy to identify and change anxiety provoking thought and behavior patterns as well as dialectical behavior therapy to introduce distress tolerance and mindfulness skills.  Progress: 25%  French Ana,  [facility]

## 2023-02-09 ENCOUNTER — Other Ambulatory Visit: Payer: Self-pay | Admitting: Family Medicine

## 2023-02-16 ENCOUNTER — Ambulatory Visit: Payer: BC Managed Care – PPO | Admitting: Behavioral Health

## 2023-02-20 ENCOUNTER — Telehealth (INDEPENDENT_AMBULATORY_CARE_PROVIDER_SITE_OTHER): Payer: BC Managed Care – PPO | Admitting: Family Medicine

## 2023-02-20 ENCOUNTER — Ambulatory Visit (HOSPITAL_COMMUNITY): Payer: BC Managed Care – PPO | Attending: Cardiovascular Disease

## 2023-02-20 ENCOUNTER — Encounter: Payer: Self-pay | Admitting: Family Medicine

## 2023-02-20 VITALS — BP 128/67 | HR 66 | Temp 97.8°F | Ht 69.0 in | Wt 247.0 lb

## 2023-02-20 DIAGNOSIS — I350 Nonrheumatic aortic (valve) stenosis: Secondary | ICD-10-CM | POA: Diagnosis present

## 2023-02-20 DIAGNOSIS — F439 Reaction to severe stress, unspecified: Secondary | ICD-10-CM

## 2023-02-20 LAB — ECHOCARDIOGRAM COMPLETE
AR max vel: 0.88 cm2
AV Area VTI: 0.86 cm2
AV Area mean vel: 0.85 cm2
AV Mean grad: 50 mm[Hg]
AV Peak grad: 90.5 mm[Hg]
Ao pk vel: 4.76 m/s
Area-P 1/2: 3.03 cm2
Height: 69 in
P 1/2 time: 582 ms
S' Lateral: 3.4 cm
Weight: 3952 [oz_av]

## 2023-02-20 NOTE — Patient Instructions (Signed)
Good talking with you today.  Glad you have established with a therapist who can help guide you through the stressors.  Continue exercise, and other coping techniques we have discussed.  I do not think any additional treatments or medications are needed at this time but please let me know if I need to help further or if any worsening of symptoms.  Recheck in office in 2 months and we can review your medications and labs at that time but I am happy to see you sooner or virtual visit sooner if needed.  Take care.   Dr. Neva Seat

## 2023-02-20 NOTE — Progress Notes (Signed)
Virtual Visit via Video Note  I connected with Steven Sloan on 02/20/23 at 8:41 AM by a video enabled telemedicine application and verified that I am speaking with the correct person using two identifiers.  Patient location: home, by self.  My location: office - [place].    I discussed the limitations, risks, security and privacy concerns of performing an evaluation and management service by telephone and the availability of in person appointments. I also discussed with the patient that there may be a patient responsible charge related to this service. The patient expressed understanding and agreed to proceed, consent obtained  Chief complaint:  Chief Complaint  Patient presents with   Follow-up    6 weeks, same stressors    History of Present Illness: Steven Sloan is a 62 y.o. male  Situational stress/anxiety Initially discussed in August, significant stressors at that time with work, spouse's health and his possible surgery in the near future with his own health.  Adjustment disorder discussed, recommended EAP program initially and handout given on adjustment disorder and stress management.  Also discussed options of FMLA.  Additional family stressor discussed in September and follow-up.  Planned on meeting with therapist on September 17 for therapy, was sleeping okay, walking 2 miles couple times per day at that time.Marland Kitchen  Appear to be family stressors at that time.  Continued on low intensity exercise at the discretion of his cardiologist, medication was deferred initially with planned therapy.  Some of the same stressors. Wife is not progressing with healing - incisions not healing. Has been meeting with specialist in [place]. Still some increased home demands. Daughter is on different swim team now. New practice schedule - requires pickup form school - has to leave work at 4 to pick her up, then other responsibilities.  Has met with therapist twice, next appt this Friday.  Serafina Mitchell. Working on mindfulness, wise mind to manage stressors - out of fight/flight mode. Has been somewhat helpful, but trying to take in recommendations.   Denies SI. Denies need for medications at this time. Sleeping ok.  Exercising daily.  Work has been supportive of current needs. No additional needs at this time.      02/20/2023    8:35 AM 12/07/2022    8:35 AM 10/17/2022    9:22 AM 04/14/2022   10:51 AM 01/17/2022   11:03 AM  Depression screen PHQ 2/9  Decreased Interest 0 0 0 0 0  Down, Depressed, Hopeless 1 1 0 0 0  PHQ - 2 Score 1 1 0 0 0  Altered sleeping  0 0 0 0  Tired, decreased energy  0 0 0 0  Change in appetite  1 0 0 0  Feeling bad or failure about yourself   1 0 0 0  Trouble concentrating  1 0 0 0  Moving slowly or fidgety/restless  0 0 0 0  Suicidal thoughts  0 0 0 0  PHQ-9 Score  4 0 0 0  Difficult doing work/chores     Not difficult at all   Appt scheduled with weight mgt in next few weeks.    Patient Active Problem List   Diagnosis Date Noted   Emphysema lung (HCC) 01/13/2022   Systolic murmur 01/11/2019   Class 2 severe obesity due to excess calories with serious comorbidity and body mass index (BMI) of 37.0 to 37.9 in adult [facility]) 08/13/2018   Prostate cancer screening 06/07/2017   Internal hemorrhoid 06/07/2017   Visit for preventive health  examination 07/26/2015   Quit smoking 07/01/2015   OSA (obstructive sleep apnea) 06/10/2015   Essential hypertension, benign 06/10/2015   Past Medical History:  Diagnosis Date   Allergy    Seasonal   Anxiety    Aortic valve stenosis 08/10/2022   severe   Chicken pox    Heart murmur    Asymptomatic -- Patient endorses previous negative evaluation   Hypertension    Sleep apnea    CPAP nightly   Past Surgical History:  Procedure Laterality Date   COLONOSCOPY     WISDOM TOOTH EXTRACTION     No Known Allergies Prior to Admission medications   Medication Sig Start Date End Date Taking? Authorizing  Provider  aspirin EC 81 MG tablet Take 81 mg by mouth daily.   Yes [provider]  atorvastatin (LIPITOR) 10 MG tablet Take 1 tablet (10 mg total) by mouth daily. 10/17/22  Yes Shade Flood, MD  Azelastine HCl 137 MCG/SPRAY SOLN PLACE 1-2 SPRAYS INTO BOTH NOSTRILS 2 TIMES DAILY. USE IN EACH NOSTRIL AS DIRECTED 02/09/23  Yes Shade Flood, MD  Ciclopirox 0.77 % gel APPLY TO AFFECTED AREA TWICE A DAY 12/08/22  Yes Shade Flood, MD  fluticasone Alegent [facility]) 50 MCG/ACT nasal spray USE 2 SPRAYS IN BOTH  NOSTRILS DAILY 01/11/22  Yes Shade Flood, MD  lisinopril (ZESTRIL) 10 MG tablet TAKE 1 TABLET BY MOUTH DAILY 09/16/22  Yes Shade Flood, MD  psyllium (REGULOID) 0.52 g capsule Take 0.52 g by mouth daily.   Yes [provider]  terazosin (HYTRIN) 1 MG capsule TAKE 1 CAPSULE BY MOUTH AT  BEDTIME 09/16/22  Yes Shade Flood, MD  terbinafine (LAMISIL) 250 MG tablet Take 1 tablet (250 mg total) by mouth daily. 10/17/22  Yes Shade Flood, MD   Social History   Socioeconomic History   Marital status: Married    Spouse name: Not on file   Number of children: Not on file   Years of education: Not on file   Highest education level: Master's degree (e.g., MA, MS, MEng, MEd, MSW, MBA)  Occupational History   Not on file  Tobacco Use   Smoking status: Former    Current packs/day: 0.00    Average packs/day: 0.3 packs/day for 25.0 years (6.3 ttl pk-yrs)    Types: Cigarettes    Start date: 05/17/1990    Quit date: 05/18/2015    Years since quitting: 7.7   Smokeless tobacco: Never  Vaping Use   Vaping status: Never Used  Substance and Sexual Activity   Alcohol use: Yes    Alcohol/week: 2.0 - 4.0 standard drinks of alcohol    Types: 2 - 4 Standard drinks or equivalent per week    Comment: social   Drug use: Not Currently    Types: Marijuana   Sexual activity: Yes  Other Topics Concern   Not on file  Social History Narrative   Not on file   Social  Determinants of Health   Financial Resource Strain: Low Risk  (10/13/2022)   Overall Financial Resource Strain (CARDIA)    Difficulty of Paying Living Expenses: Not hard at all  Food Insecurity: No Food Insecurity (10/13/2022)   Hunger Vital Sign    Worried About Running Out of Food in the Last Year: Never true    Ran Out of Food in the Last Year: Never true  Transportation Needs: No Transportation Needs (10/13/2022)   PRAPARE - Transportation    Lack of  Transportation (Medical): No    Lack of Transportation (Non-Medical): No  Physical Activity: Sufficiently Active (10/13/2022)   Exercise Vital Sign    Days of Exercise per Week: 5 days    Minutes of Exercise per Session: 60 min  Stress: No Stress Concern Present (10/13/2022)   [employer] of Occupational Health - Occupational Stress Questionnaire    Feeling of Stress : Only a little  Social Connections: Socially Isolated (10/13/2022)   Social Connection and Isolation Panel [NHANES]    Frequency of Communication with Friends and Family: Once a week    Frequency of Social Gatherings with Friends and Family: Once a week    Attends Religious Services: Never    Database administrator or Organizations: No    Attends Engineer, structural: Not on file    Marital Status: Married  Catering manager Violence: Not on file    Observations/Objective: Vitals:   02/20/23 0836  BP: 128/67  Pulse: 66  Temp: 97.8 F (36.6 C)  TempSrc: Oral  Weight: 247 lb (112 kg)  Height: 5\' 9"  (1.753 m)  Nontoxic appearance on video.  Speaking in full sentences without respiratory distress.  Euthymic mood, does not appear to be responding to internal stimuli.  Affect mood congruent.  No SI.  All questions answered with understanding of plan expressed   Assessment and Plan: Situational stress Overall stable.  Has now met with therapist multiple times with action plan on managing the stressors.  Work has been supportive.  Denies FMLA or other  paperwork for me at this time.  Commended on exercise, other coping techniques.  Sleeping well.  No new meds for now.  74-month follow-up for chronic meds, labs, and to review stressors at that time.  Sooner or video visit if needed.  Follow Up Instructions: 17-month in office appointment   I discussed the assessment and treatment plan with the patient. The patient was provided an opportunity to ask questions and all were answered. The patient agreed with the plan and demonstrated an understanding of the instructions.   The patient was advised to call back or seek an in-person evaluation if the symptoms worsen or if the condition fails to improve as anticipated.   Shade Flood, MD

## 2023-02-24 ENCOUNTER — Ambulatory Visit (INDEPENDENT_AMBULATORY_CARE_PROVIDER_SITE_OTHER): Payer: BC Managed Care – PPO | Admitting: Behavioral Health

## 2023-02-24 ENCOUNTER — Encounter: Payer: Self-pay | Admitting: Behavioral Health

## 2023-02-24 DIAGNOSIS — F411 Generalized anxiety disorder: Secondary | ICD-10-CM

## 2023-02-24 NOTE — Progress Notes (Signed)
[facility] Counselor/Therapist Progress Note  Patient ID: Steven Sloan, MRN: [id number],    Date: 02/24/2023  Time Spent: 58 minutes, 8 AM until 858 AM. This session was held via video teletherapy. The patient consented to the video teletherapy and was located in his home office during this session. He is aware it is the responsibility of the patient to secure confidentiality on his end of the session. The provider was in a private home office for the duration of this session.    Treatment Type: Individual Therapy  Reported Symptoms: Anxiety, stress, depression  Mental Status Exam: Appearance:  Well Groomed     Behavior: Appropriate  Motor: Normal  Speech/Language:  Normal Rate  Affect: Appropriate  Mood: normal  Thought process: normal  Thought content:   WNL  Sensory/Perceptual disturbances:   WNL  Orientation: oriented to person, place, time/date, situation, day of week, month of year, and year  Attention: Good  Concentration: Good  Memory: WNL  Fund of knowledge:  Good  Insight:   Good  Judgment:  Good  Impulse Control: Good   Risk Assessment: Danger to Self:  No Self-injurious Behavior: No Danger to Others: No Duty to Warn:no Physical Aggression / Violence:No  Access to Firearms a concern: No  Gang Involvement:No   Subjective: The patient has had additional testing done and the result of his severe stenosis.  His cardiologist is now saying probably more like a few months as opposed to sometime within the next year and that he will have to have procedures.  The first step will be cleaning out any arteries that might be clogged as well as trying to clean up anything else that they might find.  A few weeks after that we will be open heart surgery to repair the valve.  It became much more reality with that conversation.  He is remembering his wife's grandfather who had a heart procedure but waited too long to tell anyone and when he went to do the surgery  it was too late.  They had to have a conversation with him before he basically had a heart attack and died.  He remembers seeing his wife's grandfather laying in the hospital on the ventilator.  We talked about using visualization to change how he sees what he is going through.  We talked about positive affirmations with knowledge of the doctor's experience technology support.  We talked about how some information might help reduce his anxiety but minimizing how much information he gathers.  There is still some hold-ups with his wife's health and a series of things starting with some mild plastic surgery next week will affect how quickly she can get her health things taken care of.  He needs her to be healthy so she can be available for him.  We looked at other options including his parents helping with him for a few weeks after surgery or even if his insurance might help provide as a way to reduce his anxiety.  He also was trying to train some other people to do what he does because he would be out for 4 to 6 weeks at minimum.  We introduced a couple of different coping skills including a vagus nerve stimulation technique as well as the importance of cognitive reframing to replace/challenge anxious thoughts.  He does contract for safety having no thoughts of hurting himself or anyone else.  Interventions: Cognitive Behavioral Therapy and Dialectical Behavioral Therapy Diagnosis:Generalized anxiety disorder  Plan: I will meet with the  patient every 2 to 3 weeks. Treatment plan: Goals are to reduce the patient's anxiety and some mild depression primarily related to medical condition but also other family factors by at least 50% with a target date of July 31, 2023.  Goals for minimizing depression are for the patient to have less sadness as indicated by his report and PHQ-9 scores, have improved mood and return to a healthier level of functioning.  We will look at any other causes for depression in addition  to those stated above.  We will explore how depression is experienced by him, use cognitive behavioral therapy to explore and replace unhealthy thought and behavior patterns contributing to depression as well as encouraging the sharing of feelings related to causes and symptoms of depression.  We will introduce coping skills for managing depressive symptoms.  Goals for reducing anxiety include improving his ability to better manage stress and anxiety symptoms, identify causes for anxiety and introduce ways to lower it including resolving core conflicts contributing to the anxiety.  Finally a goal for the patient will be to manage thoughts and worrisome thinking contributing to feelings of anxiety.  Interventions include providing education about anxiety to help him understand his causes and triggers, facilitate problem solution skills and coping skills for managing anxiety.  We will also use cognitive behavioral therapy to identify and change anxiety provoking thought and behavior patterns as well as dialectical behavior therapy to introduce distress tolerance and mindfulness skills.  Progress: 25%  French Ana, [facility]                  [place], [facility]

## 2023-02-27 ENCOUNTER — Encounter: Payer: Self-pay | Admitting: Cardiovascular Disease

## 2023-03-01 ENCOUNTER — Ambulatory Visit (INDEPENDENT_AMBULATORY_CARE_PROVIDER_SITE_OTHER): Payer: BC Managed Care – PPO | Admitting: Adult Health

## 2023-03-01 ENCOUNTER — Encounter (INDEPENDENT_AMBULATORY_CARE_PROVIDER_SITE_OTHER): Payer: Self-pay | Admitting: Adult Health

## 2023-03-01 VITALS — BP 163/85 | HR 60 | Temp 98.0°F | Ht 68.5 in | Wt 245.0 lb

## 2023-03-01 DIAGNOSIS — Z0289 Encounter for other administrative examinations: Secondary | ICD-10-CM

## 2023-03-01 DIAGNOSIS — I10 Essential (primary) hypertension: Secondary | ICD-10-CM

## 2023-03-01 DIAGNOSIS — E559 Vitamin D deficiency, unspecified: Secondary | ICD-10-CM | POA: Diagnosis not present

## 2023-03-01 DIAGNOSIS — Z6836 Body mass index (BMI) 36.0-36.9, adult: Secondary | ICD-10-CM

## 2023-03-01 NOTE — Progress Notes (Signed)
Office: [phone]  /  Fax: [phone]   Initial Visit  Steven Sloan was seen in clinic today to evaluate for obesity. He is interested in losing weight to improve overall health and reduce the risk of weight related complications. He presents today to review program treatment options, initial physical assessment, and evaluation.     He was referred by: PCP  When asked what else they would like to accomplish? He states: Adopt healthier eating patterns, Improve energy levels and physical activity, Improve existing medical conditions, Reduce number of medications, and Improve quality of life  Weight history: Per pt "I have always been heavier".  When asked how has your weight affected you? He states: Contributed to medical problems, Contributed to orthopedic problems or mobility issues, Having fatigue, and Having poor endurance  Some associated conditions: Nonrheumatic aortic valve stenosis, HTN, HLD  Contributing factors: Family history, Nutritional, Reduced physical activity, and Other:   Nonrheumatic aortic valve stenosis- Required Valve replacement  Weight promoting medications identified: None  Current nutrition plan: None  Current level of physical activity: None  Current or previous pharmacotherapy: None  Response to medication: Never tried medications   Past medical history includes:   Past Medical History:  Diagnosis Date   Allergy    Seasonal   Anxiety    Aortic valve stenosis 08/10/2022   severe   Chicken pox    Heart murmur    Asymptomatic -- Patient endorses previous negative evaluation   Hypertension    Sleep apnea    CPAP nightly     Objective:   BP (!) 163/85   Pulse 60   Temp 98 F (36.7 C)   Ht 5' 8.5" (1.74 m)   Wt 245 lb (111.1 kg)   SpO2 99%   BMI 36.71 kg/m  He was weighed on the bioimpedance scale: Body mass index is 36.71 kg/m.  Peak Weight:285 , Body Fat%:31.7, Visceral Fat Rating:19, Weight trend over the last 12 months:  Increasing  General:  Alert, oriented and cooperative. Patient is in no acute distress.  Respiratory: Normal respiratory effort, no problems with respiration noted   Gait: able to ambulate independently  Mental Status: Normal mood and affect. Normal behavior. Normal judgment and thought content.   DIAGNOSTIC DATA REVIEWED:  BMET    Component Value Date/Time   NA 138 10/19/2022 0958   K 4.4 10/19/2022 0958   CL 103 10/19/2022 0958   CO2 28 10/19/2022 0958   GLUCOSE 96 10/19/2022 0958   BUN 28 (H) 10/19/2022 0958   CREATININE 1.36 10/19/2022 0958   CALCIUM 9.5 10/19/2022 0958   GFRNONAA >60 04/09/2015 1140   GFRAA >60 04/09/2015 1140   Lab Results  Component Value Date   HGBA1C 5.3 11/16/2020   HGBA1C 5.0 07/22/2015   No results found for: "INSULIN" CBC    Component Value Date/Time   WBC 7.2 01/11/2019 0858   RBC 4.99 01/11/2019 0858   HGB 15.5 01/11/2019 0858   HCT 45.3 01/11/2019 0858   PLT 208.0 01/11/2019 0858   MCV 90.7 01/11/2019 0858   MCH 31.8 04/09/2015 1140   MCHC 34.2 01/11/2019 0858   RDW 12.9 01/11/2019 0858   Iron/TIBC/Ferritin/ %Sat No results found for: "IRON", "TIBC", "FERRITIN", "IRONPCTSAT" Lipid Panel     Component Value Date/Time   CHOL 113 10/19/2022 0958   TRIG 57.0 10/19/2022 0958   HDL 45.90 10/19/2022 0958   CHOLHDL 2 10/19/2022 0958   VLDL 11.4 10/19/2022 0958   LDLCALC 56 10/19/2022 0958  Hepatic Function Panel     Component Value Date/Time   PROT 7.8 12/07/2022 0933   ALBUMIN 4.7 12/07/2022 0933   AST 20 12/07/2022 0933   ALT 25 12/07/2022 0933   ALKPHOS 51 12/07/2022 0933   BILITOT 1.0 12/07/2022 0933   BILIDIR 0.2 12/07/2022 0933      Component Value Date/Time   TSH 0.76 05/20/2021 1028     Assessment and Plan:   Essential hypertension, benign  Vitamin D deficiency  Morbid obesity (HCC), Starting BMI 36.71  ESTABLISH WITH HWW   Obesity Treatment / Action Plan:  Patient will work on garnering support from  family and friends to begin weight loss journey. Will work on eliminating or reducing the presence of highly palatable, calorie dense foods in the home. Will complete provided nutritional and psychosocial assessment questionnaire before the next appointment. Will be scheduled for indirect calorimetry to determine resting energy expenditure in a fasting state.  This will allow [place] to create a reduced calorie, high-protein meal plan to promote loss of fat mass while preserving muscle mass. Counseled on the health benefits of losing 5%-15% of total body weight. Was counseled on nutritional approaches to weight loss and benefits of reducing processed foods and consuming plant-based foods and high quality protein as part of nutritional weight management. Was counseled on pharmacotherapy and role as an adjunct in weight management.   Obesity Education Performed Today:  He was weighed on the bioimpedance scale and results were discussed and documented in the synopsis.  We discussed obesity as a disease and the importance of a more detailed evaluation of all the factors contributing to the disease.  We discussed the importance of long term lifestyle changes which include nutrition, exercise and behavioral modifications as well as the importance of customizing this to his specific health and social needs.  We discussed the benefits of reaching a healthier weight to alleviate the symptoms of existing conditions and reduce the risks of the biomechanical, metabolic and psychological effects of obesity.  Steven Sloan appears to be in the action stage of change and states they are ready to start intensive lifestyle modifications and behavioral modifications.  30 minutes was spent today on this visit including the above counseling, pre-visit chart review, and post-visit documentation.  Reviewed by clinician on day of visit: allergies, medications, problem list, medical history, surgical history, family  history, social history, and previous encounter notes pertinent to obesity diagnosis.   Steven Sloan d. Steven Schoenfeld, NP-C

## 2023-03-07 ENCOUNTER — Other Ambulatory Visit: Payer: Self-pay | Admitting: Family Medicine

## 2023-03-09 ENCOUNTER — Encounter: Payer: Self-pay | Admitting: Behavioral Health

## 2023-03-09 ENCOUNTER — Ambulatory Visit: Payer: BC Managed Care – PPO | Admitting: Behavioral Health

## 2023-03-09 DIAGNOSIS — F411 Generalized anxiety disorder: Secondary | ICD-10-CM

## 2023-03-09 NOTE — Progress Notes (Signed)
[facility] Counselor/Therapist Progress Note  Patient ID: Steven Sloan, MRN: [id number],    Date: 03/09/2023  Time Spent: 58 minutes, 9 AM until 9:58 AM. This session was held via video teletherapy. The patient consented to the video teletherapy and was located in his home office during this session. He is aware it is the responsibility of the patient to secure confidentiality on his end of the session. The provider was in a private home office for the duration of this session.    Treatment Type: Individual Therapy  Reported Symptoms: Anxiety, stress, depression  Mental Status Exam: Appearance:  Well Groomed     Behavior: Appropriate  Motor: Normal  Speech/Language:  Normal Rate  Affect: Appropriate  Mood: normal  Thought process: normal  Thought content:   WNL  Sensory/Perceptual disturbances:   WNL  Orientation: oriented to person, place, time/date, situation, day of week, month of year, and year  Attention: Good  Concentration: Good  Memory: WNL  Fund of knowledge:  Good  Insight:   Good  Judgment:  Good  Impulse Control: Good   Risk Assessment: Danger to Self:  No Self-injurious Behavior: No Danger to Others: No Duty to Warn:no Physical Aggression / Violence:No  Access to Firearms a concern: No  Gang Involvement:No   Subjective: The patient has a lot going on.  Work is very busy and he typically works at least 8-5 30 every day not taking much time for lunch.  Because he [place] from [place] there is some expectation that he be available at least part of the time during the lunch hour.  I encouraged him to spend at least 30 minutes of his lunch hour performing some type of downtime or relaxation.  He is taking physical therapy and says he can use 15 minutes of lunch to work on the abdominal physical therapy exercises he has been given.  He thought he was having some chest pains but recognized that part of the was the posture  in which she was sitting because he said so much so physical therapist are helping him work on the shoulder chest abdominal area so he does not confuse pain.  He says he does have some ongoing anxiety anticipating when he may start to feel some of the symptoms his doctor told him to expect but does not think he has yet.  There is a positive in that his wife did get one of her procedures done hopefully she can heal and is scheduled for knee surgery right before Thanksgiving.  He does not anticipate her being able to help very much once he has his surgery.  His daughter can help do some of the minor things.  He was anticipating his parents being able to help but after spending some time with them recently found out that his mother is having some heart issues and his father starting to show some cognitive decline so he does not think that will be a possibility.  We looked at alternatives including neighbors being able to help do some small things for him but the possibility of seeing if insurance will pay for some in-home health care for him especially the first couple of weeks after surgery because that is when there is an increased stroke risk.  He did do some fun things including hosting his annual blood draw that he started years ago and met and exceeded the number of units wanted.  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  We did review coping skills some of which he is using very successfully in terms of anxiety reduction and we reviewed the mindfulness part of challenging those negative and anxious thoughts also.  He especially says the breathing and the vagus nerve stimulation are beneficial to him.  We introduced a couple of different coping skills including a vagus nerve stimulation technique as well as the importance of cognitive reframing to replace/challenge anxious thoughts.  He does contract for safety having no thoughts of hurting himself or anyone  else.  Interventions: Cognitive Behavioral Therapy and Dialectical Behavioral Therapy Diagnosis:Generalized anxiety disorder  Plan: I will meet with the patient every 2 to 3 weeks. Treatment plan: Goals are to reduce the patient's anxiety and some mild depression primarily related to medical condition but also other family factors by at least 50% with a target date of July 31, 2023.  Goals for minimizing depression are for the patient to have less sadness as indicated by his report and PHQ-9 scores, have improved mood and return to a healthier level of functioning.  We will look at any other causes for depression in addition to those stated above.  We will explore how depression is experienced by him, use cognitive behavioral therapy to explore and replace unhealthy thought and behavior patterns contributing to depression as well as encouraging the sharing of feelings related to causes and symptoms of depression.  We will introduce coping skills for managing depressive symptoms.  Goals for reducing anxiety include improving his ability to better manage stress and anxiety symptoms, identify causes for anxiety and introduce ways to lower it including resolving core conflicts contributing to the anxiety.  Finally a goal for the patient will be to manage thoughts and worrisome thinking contributing to feelings of anxiety.  Interventions include providing education about anxiety to help him understand his causes and triggers, facilitate problem solution skills and coping skills for managing anxiety.  We will also use cognitive behavioral therapy to identify and change anxiety provoking thought and behavior patterns as well as dialectical behavior therapy to introduce distress tolerance and mindfulness skills.  Progress: 25%  [place], [facility]                  [place], [facility]               [place], [facility]

## 2023-03-16 ENCOUNTER — Encounter (INDEPENDENT_AMBULATORY_CARE_PROVIDER_SITE_OTHER): Payer: Self-pay | Admitting: *Deleted

## 2023-03-17 ENCOUNTER — Encounter: Payer: Self-pay | Admitting: Gastroenterology

## 2023-03-17 ENCOUNTER — Ambulatory Visit (INDEPENDENT_AMBULATORY_CARE_PROVIDER_SITE_OTHER): Payer: BC Managed Care – PPO | Admitting: Gastroenterology

## 2023-03-17 ENCOUNTER — Other Ambulatory Visit (INDEPENDENT_AMBULATORY_CARE_PROVIDER_SITE_OTHER): Payer: BC Managed Care – PPO

## 2023-03-17 VITALS — BP 144/80 | HR 72 | Ht 68.5 in | Wt 252.1 lb

## 2023-03-17 DIAGNOSIS — Z860100 Personal history of colon polyps, unspecified: Secondary | ICD-10-CM | POA: Diagnosis not present

## 2023-03-17 DIAGNOSIS — I350 Nonrheumatic aortic (valve) stenosis: Secondary | ICD-10-CM | POA: Diagnosis not present

## 2023-03-17 DIAGNOSIS — Z860101 Personal history of adenomatous and serrated colon polyps: Secondary | ICD-10-CM | POA: Diagnosis not present

## 2023-03-17 LAB — CBC
HCT: 45.4 % (ref 39.0–52.0)
Hemoglobin: 15.5 g/dL (ref 13.0–17.0)
MCHC: 34 g/dL (ref 30.0–36.0)
MCV: 92.4 fL (ref 78.0–100.0)
Platelets: 199 10*3/uL (ref 150.0–400.0)
RBC: 4.92 Mil/uL (ref 4.22–5.81)
RDW: 12.5 % (ref 11.5–15.5)
WBC: 6.7 10*3/uL (ref 4.0–10.5)

## 2023-03-17 LAB — BASIC METABOLIC PANEL
BUN: 27 mg/dL — ABNORMAL HIGH (ref 6–23)
CO2: 26 meq/L (ref 19–32)
Calcium: 9.9 mg/dL (ref 8.4–10.5)
Chloride: 107 meq/L (ref 96–112)
Creatinine, Ser: 1.48 mg/dL (ref 0.40–1.50)
GFR: 51.28 mL/min — ABNORMAL LOW (ref >60.00)
Glucose, Bld: 106 mg/dL — ABNORMAL HIGH (ref 70–99)
Potassium: 4.3 meq/L (ref 3.5–5.1)
Sodium: 141 meq/L (ref 135–145)

## 2023-03-17 MED ORDER — NA SULFATE-K SULFATE-MG SULF 17.5-3.13-1.6 GM/177ML PO SOLN: 1.0000 | ORAL | 0 refills | Status: DC

## 2023-03-17 NOTE — Progress Notes (Unsigned)
GASTROENTEROLOGY OUTPATIENT CLINIC VISIT   Primary Care Provider Shade Flood, MD [address] [place] [address] [phone]  Referring Provider Shade Flood, MD [address] [place],  [place] [address] [phone]  Patient Profile: Steven Sloan is a 62 y.o. male with a pmh significant for severe aortic stenosis (asymptomatic), hypertension, OSA, allergies, colon polyps (TA's).   The patient presents to the [facility] for an evaluation and management of problem(s) noted below:  Problem List 1. Hx of adenomatous colonic polyps   2. Severe aortic stenosis    Discussed the use of AI scribe software for clinical note transcription with the patient, who gave verbal consent to proceed.  History of Present Illness The patient presents for clinic evaluation to discuss surveillance colonoscopy scheduling.  He has a history of severe aortic stenosis but thankfully does not have significant symptomatology at this time.  He follows with Cardiology, and if he becomes symptomatic, then coronary catheterization angiography will be pursued.  He has had adenomas removed in the past.  He reports no recent gastrointestinal symptoms, including blood in the stool, heartburn, reflux, or difficulty swallowing. He has regular bowel movements, facilitated by daily use of psyllium fiber (Metamucil).  The patient is currently maintaining an active lifestyle, walking two miles five nights a week. 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 He reports no other health concerns at this time.   GI Review of Systems Positive as above Negative for fecal urgency, melena, hematochezia  Review of Systems General: Denies fevers/chills/weight loss unintentionally Cardiovascular: Denies chest pain Pulmonary: Denies shortness of breath Gastroenterological: See HPI Genitourinary: Denies darkened urine Hematological: Denies easy  bruising/bleeding Dermatological: Denies jaundice Psychological: Mood is stable   Medications Current Outpatient Medications  Medication Sig Dispense Refill   aspirin EC 81 MG tablet Take 81 mg by mouth daily.     atorvastatin (LIPITOR) 10 MG tablet Take 1 tablet (10 mg total) by mouth daily. 90 tablet 1   Azelastine HCl 137 MCG/SPRAY SOLN INSTILL 1-2 SPRAYS INTO BOTH NOSTRILS 2 TIMES DAILY AS DIRECTED 30 mL 2   chlorhexidine (PERIDEX) 0.12 % solution      Ciclopirox 0.77 % gel APPLY TO AFFECTED AREA TWICE A DAY 100 g 0   fluticasone (FLONASE) 50 MCG/ACT nasal spray USE 2 SPRAYS IN BOTH  NOSTRILS DAILY 48 g 11   lisinopril (ZESTRIL) 10 MG tablet TAKE 1 TABLET BY MOUTH DAILY 90 tablet 3   Na Sulfate-K Sulfate-Mg Sulf (SUPREP BOWEL PREP KIT) 17.5-3.13-1.6 GM/177ML SOLN Take 1 kit by mouth as directed. For colonoscopy prep 354 mL 0   psyllium (REGULOID) 0.52 g capsule Take 0.52 g by mouth daily.     terazosin (HYTRIN) 1 MG capsule TAKE 1 CAPSULE BY MOUTH AT  BEDTIME 90 capsule 3   terbinafine (LAMISIL) 250 MG tablet Take 1 tablet (250 mg total) by mouth daily. 90 tablet 1   No current facility-administered medications for this visit.    Allergies No Known Allergies  Histories Past Medical History:  Diagnosis Date   Allergy    Seasonal   Anxiety    Aortic valve stenosis 08/10/2022   severe   Chicken pox    Heart murmur    Asymptomatic -- Patient endorses previous negative evaluation   Hypertension    Sleep apnea    CPAP nightly   Past Surgical History:  Procedure Laterality Date   COLONOSCOPY     WISDOM  TOOTH EXTRACTION     Social History   Socioeconomic History   Marital status: Married    Spouse name: Not on file   Number of children: Not on file   Years of education: Not on file   Highest education level: Master's degree (e.g., MA, MS, MEng, MEd, MSW, MBA)  Occupational History   Not on file  Tobacco Use   Smoking status: Former    Current packs/day: 0.00     Average packs/day: 0.3 packs/day for 25.0 years (6.3 ttl pk-yrs)    Types: Cigarettes    Start date: 05/17/1990    Quit date: 05/18/2015    Years since quitting: 7.8   Smokeless tobacco: Never  Vaping Use   Vaping status: Never Used  Substance and Sexual Activity   Alcohol use: Yes    Alcohol/week: 2.0 - 4.0 standard drinks of alcohol    Types: 2 - 4 Standard drinks or equivalent per week    Comment: social   Drug use: Not Currently    Types: Marijuana   Sexual activity: Yes  Other Topics Concern   Not on file  Social History Narrative   Not on file   Social Determinants of Health   Financial Resource Strain: Low Risk  (10/13/2022)   Overall Financial Resource Strain (CARDIA)    Difficulty of Paying Living Expenses: Not hard at all  Food Insecurity: No Food Insecurity (10/13/2022)   Hunger Vital Sign    Worried About Running Out of Food in the Last Year: Never true    Ran Out of Food in the Last Year: Never true  Transportation Needs: No Transportation Needs (10/13/2022)   PRAPARE - Administrator, Civil Service (Medical): No    Lack of Transportation (Non-Medical): No  Physical Activity: Sufficiently Active (10/13/2022)   Exercise Vital Sign    Days of Exercise per Week: 5 days    Minutes of Exercise per Session: 60 min  Stress: No Stress Concern Present (10/13/2022)   [employer] of Occupational Health - Occupational Stress Questionnaire    Feeling of Stress : Only a little  Social Connections: Socially Isolated (10/13/2022)   Social Connection and Isolation Panel [NHANES]    Frequency of Communication with Friends and Family: Once a week    Frequency of Social Gatherings with Friends and Family: Once a week    Attends Religious Services: Never    Database administrator or Organizations: No    Attends Engineer, structural: Not on file    Marital Status: Married  Catering manager Violence: Not on file   Family History  Problem Relation Age of  Onset   Heart attack Father 76   Heart disease Father    Alcohol abuse Sister    Heart attack Paternal Uncle 68   Cancer Maternal Grandmother 64       Unsure   Heart attack Paternal Grandfather 43   Colon cancer Neg Hx    Colon polyps Neg Hx    Esophageal cancer Neg Hx    Rectal cancer Neg Hx    Stomach cancer Neg Hx    Inflammatory bowel disease Neg Hx    Liver disease Neg Hx    Pancreatic cancer Neg Hx    I have reviewed his medical, social, and family history in detail and updated the electronic medical record as necessary.    PHYSICAL EXAMINATION  BP (!) 144/80   Pulse 72   Ht 5' 8.5" (1.74 m) Comment:  height measured without shoes  Wt 252 lb 2 oz (114.4 kg)   BMI 37.78 kg/m  Wt Readings from Last 3 Encounters:  03/17/23 252 lb 2 oz (114.4 kg)  03/01/23 245 lb (111.1 kg)  02/20/23 247 lb (112 kg)  GEN: NAD, appears stated age, doesn't appear chronically ill PSYCH: Cooperative, without pressured speech EYE: Conjunctivae pink, sclerae anicteric ENT: MMM CV: Systolic aortic stenosis murmur appreciated, nontachycardic RESP: No audible wheezing GI: NABS, soft, protuberant abdomen, nontender, without rebound MSK/EXT: No significant lower extremity edema SKIN: No jaundice NEURO:  Alert & Oriented x 3, no focal deficits   REVIEW OF DATA  I reviewed the following data at the time of this encounter:  GI Procedures and Studies  2017 colonoscopy - Three 3 to 5 mm polyps in the sigmoid colon and in the transverse colon, removed with a cold snare. Resected and retrieved. - The examination was otherwise normal on direct and retroflexion views  Laboratory Studies  Reviewed those in epic  Imaging Studies  No relevant studies to review   ASSESSMENT  Mr. Trippe is a 62 y.o. male with a pmh significant for severe aortic stenosis (asymptomatic), hypertension, OSA, allergies, colon polyps (TA's).  The patient is seen today for evaluation and management of:  1. Hx of  adenomatous colonic polyps   2. Severe aortic stenosis    The patient is hemodynamically and clinically stable at this time from a GI perspective.  The patient is overdue for surveillance colonoscopy at this point in time.  With that being said, his polyps were previously low risk (no advanced adenomas).  The patient is a reasonable candidate for colon polyp surveillance.  Unfortunately due to his severe aortic stenosis, even though he is not symptomatic, it makes sense that we have to do his procedure at [facility] from a cardiac standpoint.  We will work on trying to schedule this during one of my hospital weeks in the next few months.  The risks and benefits of endoscopic evaluation were discussed with the patient; these include but are not limited to the risk of perforation, infection, bleeding, missed lesions, lack of diagnosis, severe illness requiring hospitalization, as well as anesthesia and sedation related illnesses.  The patient and/or family is agreeable to proceed.  All patient questions were answered to the best of my ability, and the patient agrees to the aforementioned plan of action with follow-up as indicated.   PLAN  Proceed with scheduling colonoscopy at [facility] due to severe aortic stenosis Preprocedure labs to be obtained   Orders Placed This Encounter  Procedures   Procedural/ Surgical Case Request: COLONOSCOPY WITH PROPOFOL   CBC   Basic Metabolic Panel (BMET)   Ambulatory referral to Gastroenterology    New Prescriptions   NA SULFATE-K SULFATE-MG SULF (SUPREP BOWEL PREP KIT) 17.5-3.13-1.6 GM/177ML SOLN    Take 1 kit by mouth as directed. For colonoscopy prep   Modified Medications   No medications on file    Planned Follow Up No follow-ups on file.   Total Time in Face-to-Face and in Coordination of Care for patient including independent/personal interpretation/review of prior testing, medical history, examination, medication adjustment, communicating  results with the patient directly, and documentation within the EHR is 35 minutes.   Corliss Parish, MD [facility] Advanced Endoscopy Office # [phone]

## 2023-03-17 NOTE — Patient Instructions (Addendum)
You have been scheduled for a colonoscopy. Please follow written instructions given to you at your visit today.   Please pick up your prep supplies at the pharmacy within the next 1-3 days.  If you use inhalers (even only as needed), please bring them with you on the day of your procedure.  DO NOT TAKE 7 DAYS PRIOR TO TEST- Trulicity (dulaglutide) Ozempic, Wegovy (semaglutide) Mounjaro (tirzepatide) Bydureon Bcise (exanatide extended release)  DO NOT TAKE 1 DAY PRIOR TO YOUR TEST Rybelsus (semaglutide) Adlyxin (lixisenatide) Victoza (liraglutide) Byetta (exanatide) ___________________________________________________________________________  We have sent the following medications to your pharmacy for you to pick up at your convenience: Suprep    Your provider has requested that you go to the basement level for lab work before leaving today. Press "B" on the elevator. The lab is located at the first door on the left as you exit the elevator.  _______________________________________________________  If your blood pressure at your visit was 140/90 or greater, please contact your primary care physician to follow up on this.  _______________________________________________________  If you are age 17 or older, your body mass index should be between 23-30. Your Body mass index is 37.78 kg/m. If this is out of the aforementioned range listed, please consider follow up with your Primary Care Provider.  If you are age 65 or younger, your body mass index should be between 19-25. Your Body mass index is 37.78 kg/m. If this is out of the aformentioned range listed, please consider follow up with your Primary Care Provider.   ________________________________________________________  The [facility] providers would like to encourage you to use [facility] to communicate with providers for non-urgent requests or questions.  Due to long hold times on the telephone, sending your provider a message by  [facility] may be a faster and more efficient way to get a response.  Please allow 48 business hours for a response.  Please remember that this is for non-urgent requests.  _______________________________________________________  Thank you for choosing me and [facility].  Dr. Meridee Score

## 2023-03-20 ENCOUNTER — Encounter (INDEPENDENT_AMBULATORY_CARE_PROVIDER_SITE_OTHER): Payer: Self-pay

## 2023-03-20 ENCOUNTER — Encounter: Payer: Self-pay | Admitting: Gastroenterology

## 2023-03-20 ENCOUNTER — Telehealth: Payer: Self-pay

## 2023-03-20 DIAGNOSIS — Z860101 Personal history of adenomatous and serrated colon polyps: Secondary | ICD-10-CM | POA: Insufficient documentation

## 2023-03-20 DIAGNOSIS — I350 Nonrheumatic aortic (valve) stenosis: Secondary | ICD-10-CM | POA: Insufficient documentation

## 2023-03-20 NOTE — Telephone Encounter (Signed)
   Name: Steven Sloan  DOB: Jul 22, 1960  MRN: [id number]  Primary Cardiologist: Thurmon Fair, MD   Preoperative team, please contact this patient and set up a phone call appointment for further preoperative risk assessment. Please obtain consent and complete medication review. Thank you for your help.  I confirm that guidance regarding antiplatelet and oral anticoagulation therapy has been completed and, if necessary, noted below.  Patient on ASA 81 mg but not indicated to be held   I also confirmed the patient resides in the state of [place]. As per [facility] Medical Board telemedicine laws, the patient must reside in the state in which the provider is licensed.   Napoleon Form, Leodis Rains, NP 03/20/2023, 1:54 PM [facility]

## 2023-03-20 NOTE — Telephone Encounter (Signed)
Request for surgical clearance:     Endoscopy Procedure  What type of surgery is being performed?     Colonoscopy   When is this surgery scheduled?     04/14/23  What type of clearance is required ?   Medical/Cardiac Clearance   Are there any medications that need to be held prior to surgery and how long? No medicine to be held. Cardiac Clearance needed.   Practice name and name of physician performing surgery?      [facility]  What is your office phone and fax number?      Phone- [phone]  Fax- [phone]  Anesthesia type (None, local, MAC, general) ?       MAC

## 2023-03-20 NOTE — Telephone Encounter (Signed)
Left message to call back to set up tele pre op appt.  

## 2023-03-21 ENCOUNTER — Encounter (INDEPENDENT_AMBULATORY_CARE_PROVIDER_SITE_OTHER): Payer: Self-pay | Admitting: Internal Medicine

## 2023-03-21 ENCOUNTER — Telehealth: Payer: Self-pay | Admitting: *Deleted

## 2023-03-21 ENCOUNTER — Ambulatory Visit (INDEPENDENT_AMBULATORY_CARE_PROVIDER_SITE_OTHER): Payer: BC Managed Care – PPO | Admitting: Internal Medicine

## 2023-03-21 VITALS — BP 161/80 | HR 71 | Temp 97.9°F | Ht 69.0 in | Wt 247.0 lb

## 2023-03-21 DIAGNOSIS — R944 Abnormal results of kidney function studies: Secondary | ICD-10-CM | POA: Insufficient documentation

## 2023-03-21 DIAGNOSIS — I10 Essential (primary) hypertension: Secondary | ICD-10-CM

## 2023-03-21 DIAGNOSIS — G4733 Obstructive sleep apnea (adult) (pediatric): Secondary | ICD-10-CM

## 2023-03-21 DIAGNOSIS — R0602 Shortness of breath: Secondary | ICD-10-CM

## 2023-03-21 DIAGNOSIS — R5383 Other fatigue: Secondary | ICD-10-CM

## 2023-03-21 DIAGNOSIS — E785 Hyperlipidemia, unspecified: Secondary | ICD-10-CM | POA: Insufficient documentation

## 2023-03-21 DIAGNOSIS — I350 Nonrheumatic aortic (valve) stenosis: Secondary | ICD-10-CM

## 2023-03-21 DIAGNOSIS — Z1331 Encounter for screening for depression: Secondary | ICD-10-CM

## 2023-03-21 NOTE — Assessment & Plan Note (Signed)
 See obesity treatment plan

## 2023-03-21 NOTE — Progress Notes (Signed)
Office: [phone]  /  Fax: [phone]   Subjective   Initial Visit  Steven Sloan (MR# [id number]) is a 62 y.o. male who presents for evaluation and treatment of obesity and related comorbidities. Current BMI is Body mass index is 36.48 kg/m. Steven Sloan has been struggling with his weight for many years and has been unsuccessful in either losing weight, maintaining weight loss, or reaching his healthy weight goal.  Steven Sloan is currently in the action stage of change and ready to dedicate time achieving and maintaining a healthier weight. Steven Sloan is interested in becoming our patient and working on intensive lifestyle modifications including (but not limited to) diet and exercise for weight loss.  When asked how their weight has affected their life and health, he states: Contributed to medical problems, Having fatigue, and Having poor endurance  When asked what else they would like to accomplish? He states: Adopt healthier eating patterns, Improve energy levels and physical activity, Improve existing medical conditions, Improve quality of life, and Improve appearance  Weight history:  He starting to note weight gain during : adulthood.  Life events associated with weight gain include : education.   Their highest weight has been:  285 lbs.  Previous weight-loss programs : Ketogenic.  Their maximum weight loss was:  45 lbs.  Their greatest challenge with dieting: difficulty maintaining reduced calorie state.  Weight promoting medications identified: None  Current or previous pharmacotherapy: None.  Response to medication: Never tried medications  Nutritional History:  Current nutrition plan: Portion control / smart choices.  How often do they eat breakfast : 3-5 days a week.  Number of times they eat per day: 3  What beverages do they drink: water and unsweetened tea .   Use of artificial sweetners : No  Food intolerances or restrictions: none.  Food triggers: Stress,  Boredom, Help stay awake, and To help comfort.  Food cravings: Salty and chips savory and sweets  Current level of physical activity: Walking  Past medical history includes:   Past Medical History:  Diagnosis Date   Allergy    Seasonal   Anxiety    Aortic valve stenosis 08/10/2022   severe   Chicken pox    Heart murmur    Asymptomatic -- Patient endorses previous negative evaluation   High cholesterol    Hypertension    Sleep apnea    CPAP nightly     Objective   BP (!) 161/80 Comment: White Coat Syndrome  Pulse 71   Temp 97.9 F (36.6 C)   Ht 5\' 9"  (1.753 m)   Wt 247 lb (112 kg)   SpO2 98%   BMI 36.48 kg/m  He was weighed on the bioimpedance scale: Body mass index is 36.48 kg/m.    Anthropometrics:  Vitals Temp: 97.9 F (36.6 C) BP: (!) 161/80 (White Coat Syndrome) Pulse Rate: 71 SpO2: 98 %   Anthropometric Measurements Height: 5\' 9"  (1.753 m) Weight: 247 lb (112 kg) BMI (Calculated): 36.46 Peak Weight: 285 lb Waist Measurement : 48 inches   Body Composition  Body Fat %: 31.5 % Fat Mass (lbs): 77.8 lbs Muscle Mass (lbs): 161 lbs Total Body Water (lbs): 115.4 lbs Visceral Fat Rating : 19   Other Clinical Data Fasting: yes Labs: yes Today's Visit #: 1 Starting Date: 03/21/23    Physical Exam:  General: He is overweight, cooperative, alert, well developed, and in no acute distress. PSYCH: Has normal mood, affect and thought process.   HEENT: EOMI, sclerae are anicteric. Lungs:  Normal breathing effort, no conversational dyspnea. Extremities: No edema.  Neurologic: No gross sensory or motor deficits. No tremors or fasciculations noted.    Diagnostic Data Reviewed  EKG: Normal sinus rhythm, rate 52 BPM.  Indirect Calorimeter completed today shows a VO2 of 409 and a REE of 2822.  His calculated basal metabolic rate is 0981 thus his resting energy expenditure same as calculated.  Depression Screen  Steven Sloan's PHQ-9 score was: 8.      02/20/2023    8:35 AM  Depression screen PHQ 2/9  Decreased Interest 0  Down, Depressed, Hopeless 1  PHQ - 2 Score 1    Screening for Sleep Related Breathing Disorders  Steven Sloan denies daytime somnolence and denies waking up still tired. Patient has a history of symptoms of OSA, but now uses a CPAP machine nightly. Steven Sloan generally gets 8 hours of sleep per night, and states that he has generally restful sleep. Snoring is not present. Apneic episodes are not present. Epworth Sleepiness Score is 3.   BMET    Component Value Date/Time   NA 141 03/17/2023 1044   K 4.3 03/17/2023 1044   CL 107 03/17/2023 1044   CO2 26 03/17/2023 1044   GLUCOSE 106 (H) 03/17/2023 1044   BUN 27 (H) 03/17/2023 1044   CREATININE 1.48 03/17/2023 1044   CALCIUM 9.9 03/17/2023 1044   GFRNONAA >60 04/09/2015 1140   GFRAA >60 04/09/2015 1140   Lab Results  Component Value Date   HGBA1C 5.3 11/16/2020   HGBA1C 5.0 07/22/2015   No results found for: "INSULIN" CBC    Component Value Date/Time   WBC 6.7 03/17/2023 1044   RBC 4.92 03/17/2023 1044   HGB 15.5 03/17/2023 1044   HCT 45.4 03/17/2023 1044   PLT 199.0 03/17/2023 1044   MCV 92.4 03/17/2023 1044   MCH 31.8 04/09/2015 1140   MCHC 34.0 03/17/2023 1044   RDW 12.5 03/17/2023 1044   Iron/TIBC/Ferritin/ %Sat No results found for: "IRON", "TIBC", "FERRITIN", "IRONPCTSAT" Lipid Panel     Component Value Date/Time   CHOL 113 10/19/2022 0958   TRIG 57.0 10/19/2022 0958   HDL 45.90 10/19/2022 0958   CHOLHDL 2 10/19/2022 0958   VLDL 11.4 10/19/2022 0958   LDLCALC 56 10/19/2022 0958   Hepatic Function Panel     Component Value Date/Time   PROT 7.8 12/07/2022 0933   ALBUMIN 4.7 12/07/2022 0933   AST 20 12/07/2022 0933   ALT 25 12/07/2022 0933   ALKPHOS 51 12/07/2022 0933   BILITOT 1.0 12/07/2022 0933   BILIDIR 0.2 12/07/2022 0933      Component Value Date/Time   TSH 0.76 05/20/2021 1028     Assessment and Plan   TREATMENT PLAN FOR  OBESITY:  Recommended Dietary Goals  Steven Sloan is currently in the action stage of change. As such, his goal is to implement medically supervised weight loss plan.  He has agreed to implement: the Category 4 plan - 1800 kcal per day  Behavioral Intervention  We discussed the following Behavioral Modification Strategies today: increasing lean protein intake to established goals, decreasing simple carbohydrates , increasing vegetables, increasing lower glycemic fruits, increasing fiber rich foods, avoiding skipping meals, increasing water intake, work on meal planning and preparation, reading food labels , keeping healthy foods at home, identifying sources and decreasing liquid calories, decreasing eating out or consumption of processed foods, and making healthy choices when eating convenient foods, planning for success, and better snacking choices  Additional resources provided today:  Category  4 packet  Recommended Physical Activity Goals  Greeley has been advised to work up to 150 minutes of moderate intensity aerobic activity a week and strengthening exercises 2-3 times per week for cardiovascular health, weight loss maintenance and preservation of muscle mass.   He has agreed to :  Think about enjoyable ways to increase daily physical activity and overcoming barriers to exercise and Increase physical activity in their day and reduce sedentary time (increase NEAT).  Pharmacotherapy We will work on building a Therapist, art and behavioral strategies. We will discuss the role of pharmacotherapy as an adjunct at subsequent visits.   ASSOCIATED CONDITIONS ADDRESSED TODAY  Other Fatigue Toree denies daytime somnolence and denies waking up still tired. Patient has a history of symptoms of OSA, but now uses a CPAP machine nightly. Belvin generally gets 8 hours of sleep per night, and states that he has generally restful sleep. Snoring is not present. Apneic episodes are not  present. Epworth Sleepiness Score is 3.  Crescencio does feel that his weight is causing his energy to be lower than it should be. Fatigue may be related to obesity, depression or many other causes. Labs will be ordered, and in the meanwhile, Dolores will focus on self care including making healthy food choices, increasing physical activity and focusing on stress reduction.  Shortness of Breath Shondale notes increasing shortness of breath with exercising and seems to be worsening over time with weight gain. He notes getting out of breath sooner with activity than he used to. This has not gotten worse recently. Juandiego denies shortness of breath at rest or orthopnea.Yosmar notes increasing shortness of breath with exercising and seems to be worsening over time with weight gain. He notes getting out of breath sooner with activity than he used to. This has not gotten worse recently. Mizell denies shortness of breath at rest or orthopnea.  Other fatigue -     Vitamin B12  SOB (shortness of breath) on exertion  Depression screen  Morbid obesity (HCC) Assessment & Plan: See obesity treatment plan   Essential hypertension, benign Assessment & Plan: His blood pressure was elevated today.  He reports having whitecoat hypertension.  He is currently on lisinopril 10 mg/day and terazosin 1 mg/day.  I recommend that he check his blood pressure in the morning and also before bedtime to assess for blood pressure control as well as cardiovascular risk.  If his blood pressure remains above target he will reach out to his primary care team or cardiologist for medication intensification.  Losing 10% of body weight may improve blood pressure control also reducing sodium in diet.   Hyperlipidemia, unspecified hyperlipidemia type Assessment & Plan: LDL is at goal. Elevated LDL may be secondary to nutrition, genetics and spillover effect from excess adiposity. Recommended LDL goal is <70 to reduce the risk of fatty  streaks and the progression to obstructive ASCVD in the future.   Lab Results  Component Value Date   CHOL 113 10/19/2022   HDL 45.90 10/19/2022   LDLCALC 56 10/19/2022   TRIG 57.0 10/19/2022   CHOLHDL 2 10/19/2022   He is currently on atorvastatin 10 mg once a day without any adverse effects.  He will continue statin therapy.  Continue weight loss therapy, losing 10% or more of body weight may improve condition. Also advised to reduce saturated fats in diet to less than 10% of daily calories.        Decreased GFR Assessment & Plan: I looked back at  GFR's up to 2017 patient has had a decline since then his baseline at the time was 60s.  This coincides with initiation of ACE inhibitor.  He reports seeing a nephrologist but considering 3 or more months of GFR's under 60 he does meet criteria for chronic kidney disease 3a, we reviewed this with patient also the importance of maintaining blood pressure control.  We also counseled on avoidance of nephrotoxins.  He will likely be receiving a dye load with his open heart surgery and therefore is at risk for acute kidney injury.  He is also to maintain adequate hydration.  Losing weight may also improve kidney function.   Severe aortic stenosis Assessment & Plan: Currently asymptomatic.  Patient reports having a bicuspid aortic valve with severe aortic stenosis.  He is looking at having open heart surgery in the near future.  He would like to lose weight before having procedure.  His blood pressure is not well-controlled and he will work with primary care team and cardiologist for treatment intensification.  We also encourage patient to begin monitoring his blood pressure at home.   Class 2 severe obesity with serious comorbidity and body mass index (BMI) of 36.0 to 36.9 in adult, unspecified obesity type [facility]) Assessment & Plan: See obesity treatment plan  Orders: -     Hemoglobin A1c -     Insulin, random -     VITAMIN D 25 Hydroxy  (Vit-D Deficiency, Fractures) -     Glucose, fasting  OSA (obstructive sleep apnea) Assessment & Plan: On CPAP with reported good compliance. Continue PAP therapy. Losing 15% or more of body weight may improve AHI.        Follow-up  He was informed of the importance of frequent follow-up visits to maximize his success with intensive lifestyle modifications for his multiple health conditions. He was informed we would discuss his lab results at his next visit unless there is a critical issue that needs to be addressed sooner. Dyson agreed to keep his next visit at the agreed upon time to discuss these results.  Attestation Statement  This is the patient's first visit at [facility]. The patient's Health Questionnaire was reviewed at length. Included in the packet: current and past health history, medications, allergies, ROS, gynecologic history (women only), surgical history, family history, social history, weight history, weight loss surgery history (for those that have had weight loss surgery), nutritional evaluation, mood and food questionnaire, PHQ9, Epworth questionnaire, sleep habits questionnaire, patient life and health improvement goals questionnaire. These will all be scanned into the patient's chart under media.   During the visit we reviewed bioimpedance scale results, and indirect calorimeter results. I used this information to tailor a meal plan for the patient that will help him to lose weight and will improve his obesity-related conditions going forward. I performed a medically necessary appropriate examination and/or evaluation. I discussed the assessment and treatment plan with the patient. The patient was provided an opportunity to ask questions and all were answered. The patient agreed with the plan and demonstrated an understanding of the instructions. Labs were ordered at this visit and will be reviewed at the next visit unless more critical results need to be  addressed immediately. Clinical information was updated and documented in the EMR.   Time spent on visit including pre-visit chart review and post-visit care was 60 minutes      Worthy Rancher, MD

## 2023-03-21 NOTE — Assessment & Plan Note (Signed)
LDL is at goal. Elevated LDL may be secondary to nutrition, genetics and spillover effect from excess adiposity. Recommended LDL goal is <70 to reduce the risk of fatty streaks and the progression to obstructive ASCVD in the future.   Lab Results  Component Value Date   CHOL 113 10/19/2022   HDL 45.90 10/19/2022   LDLCALC 56 10/19/2022   TRIG 57.0 10/19/2022   CHOLHDL 2 10/19/2022   He is currently on atorvastatin 10 mg once a day without any adverse effects.  He will continue statin therapy.  Continue weight loss therapy, losing 10% or more of body weight may improve condition. Also advised to reduce saturated fats in diet to less than 10% of daily calories.

## 2023-03-21 NOTE — Assessment & Plan Note (Signed)
Currently asymptomatic.  Patient reports having a bicuspid aortic valve with severe aortic stenosis.  He is looking at having open heart surgery in the near future.  He would like to lose weight before having procedure.  His blood pressure is not well-controlled and he will work with primary care team and cardiologist for treatment intensification.  We also encourage patient to begin monitoring his blood pressure at home.

## 2023-03-21 NOTE — Assessment & Plan Note (Signed)
On CPAP with reported good compliance. Continue PAP therapy. Losing 15% or more of body weight may improve AHI.    

## 2023-03-21 NOTE — Assessment & Plan Note (Signed)
I looked back at [facility] up to 2017 patient has had a decline since then his baseline at the time was 60s.  This coincides with initiation of ACE inhibitor.  He reports seeing a nephrologist but considering 3 or more months of GFR's under 60 he does meet criteria for chronic kidney disease 3a, we reviewed this with patient also the importance of maintaining blood pressure control.  We also counseled on avoidance of nephrotoxins.  He will likely be receiving a dye load with his open heart surgery and therefore is at risk for acute kidney injury.  He is also to maintain adequate hydration.  Losing weight may also improve kidney function.

## 2023-03-21 NOTE — Assessment & Plan Note (Addendum)
His blood pressure was elevated today.  He reports having whitecoat hypertension.  He is currently on lisinopril 10 mg/day and terazosin 1 mg/day.  I recommend that he check his blood pressure in the morning and also before bedtime to assess for blood pressure control as well as cardiovascular risk.  If his blood pressure remains above target he will reach out to his primary care team or cardiologist for medication intensification.  Losing 10% of body weight may improve blood pressure control also reducing sodium in diet.

## 2023-03-21 NOTE — Telephone Encounter (Signed)
Pt has been scheduled tele pre op appt 04/06/23. Med rec and consent are done..      Patient Consent for Virtual Visit        Steven Sloan has provided verbal consent on 03/21/2023 for a virtual visit (video or telephone).   CONSENT FOR VIRTUAL VISIT FOR:  Steven Sloan  By participating in this virtual visit I agree to the following:  I hereby voluntarily request, consent and authorize [facility] and its employed or contracted physicians, physician assistants, nurse practitioners or other licensed health care professionals (the Practitioner), to provide me with telemedicine health care services (the "Services") as deemed necessary by the treating Practitioner. I acknowledge and consent to receive the Services by the Practitioner via telemedicine. I understand that the telemedicine visit will involve communicating with the Practitioner through live audiovisual communication technology and the disclosure of certain medical information by electronic transmission. I acknowledge that I have been given the opportunity to request an in-person assessment or other available alternative prior to the telemedicine visit and am voluntarily participating in the telemedicine visit.  I understand that I have the right to withhold or withdraw my consent to the use of telemedicine in the course of my care at any time, without affecting my right to future care or treatment, and that the Practitioner or I may terminate the telemedicine visit at any time. I understand that I have the right to inspect all information obtained and/or recorded in the course of the telemedicine visit and may receive copies of available information for a reasonable fee.  I understand that some of the potential risks of receiving the Services via telemedicine include:  Delay or interruption in medical evaluation due to technological equipment failure or disruption; Information transmitted may not be sufficient (e.g. poor  resolution of images) to allow for appropriate medical decision making by the Practitioner; and/or  In rare instances, security protocols could fail, causing a breach of personal health information.  Furthermore, I acknowledge that it is my responsibility to provide information about my medical history, conditions and care that is complete and accurate to the best of my ability. I acknowledge that Practitioner's advice, recommendations, and/or decision may be based on factors not within their control, such as incomplete or inaccurate data provided by me or distortions of diagnostic images or specimens that may result from electronic transmissions. I understand that the practice of medicine is not an exact science and that Practitioner makes no warranties or guarantees regarding treatment outcomes. I acknowledge that a copy of this consent can be made available to me via my patient portal [facility] MyChart), or I can request a printed copy by calling the office of [facility].    I understand that my insurance will be billed for this visit.   I have read or had this consent read to me. I understand the contents of this consent, which adequately explains the benefits and risks of the Services being provided via telemedicine.  I have been provided ample opportunity to ask questions regarding this consent and the Services and have had my questions answered to my satisfaction. I give my informed consent for the services to be provided through the use of telemedicine in my medical care

## 2023-03-21 NOTE — Telephone Encounter (Signed)
Pt has been scheduled tele pre op appt 04/06/23. Med rec and consent are done.

## 2023-03-22 LAB — VITAMIN D 25 HYDROXY (VIT D DEFICIENCY, FRACTURES): Vit D, 25-Hydroxy: 40.9 ng/mL (ref 30.0–100.0)

## 2023-03-22 LAB — HEMOGLOBIN A1C
Est. average glucose Bld gHb Est-mCnc: 105 mg/dL
Hgb A1c MFr Bld: 5.3 % (ref 4.8–5.6)

## 2023-03-22 LAB — GLUCOSE, FASTING: Glucose, Plasma: 108 mg/dL — ABNORMAL HIGH (ref 70–99)

## 2023-03-22 LAB — VITAMIN B12: Vitamin B-12: 574 pg/mL (ref 232–1245)

## 2023-03-22 LAB — INSULIN, RANDOM: INSULIN: 12.6 u[IU]/mL (ref 2.6–24.9)

## 2023-03-23 ENCOUNTER — Encounter: Payer: Self-pay | Admitting: Behavioral Health

## 2023-03-23 ENCOUNTER — Ambulatory Visit: Payer: BC Managed Care – PPO | Admitting: Behavioral Health

## 2023-03-23 DIAGNOSIS — F411 Generalized anxiety disorder: Secondary | ICD-10-CM
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
## 2023-03-31 ENCOUNTER — Other Ambulatory Visit: Payer: Self-pay | Admitting: Family Medicine

## 2023-04-04 ENCOUNTER — Encounter (INDEPENDENT_AMBULATORY_CARE_PROVIDER_SITE_OTHER): Payer: Self-pay | Admitting: Internal Medicine

## 2023-04-04 ENCOUNTER — Ambulatory Visit (INDEPENDENT_AMBULATORY_CARE_PROVIDER_SITE_OTHER): Payer: BC Managed Care – PPO | Admitting: Internal Medicine

## 2023-04-04 VITALS — BP 148/93 | HR 69 | Temp 97.5°F | Ht 69.0 in | Wt 240.0 lb

## 2023-04-04 DIAGNOSIS — Z9189 Other specified personal risk factors, not elsewhere classified: Secondary | ICD-10-CM | POA: Insufficient documentation

## 2023-04-04 DIAGNOSIS — R7303 Prediabetes: Secondary | ICD-10-CM

## 2023-04-04 DIAGNOSIS — G4733 Obstructive sleep apnea (adult) (pediatric): Secondary | ICD-10-CM | POA: Diagnosis not present

## 2023-04-04 DIAGNOSIS — E66812 Obesity, class 2: Secondary | ICD-10-CM

## 2023-04-04 DIAGNOSIS — E7800 Pure hypercholesterolemia, unspecified: Secondary | ICD-10-CM

## 2023-04-04 DIAGNOSIS — I10 Essential (primary) hypertension: Secondary | ICD-10-CM | POA: Diagnosis not present

## 2023-04-04 DIAGNOSIS — Z6836 Body mass index (BMI) 36.0-36.9, adult: Secondary | ICD-10-CM

## 2023-04-04 DIAGNOSIS — Z87891 Personal history of nicotine dependence: Secondary | ICD-10-CM

## 2023-04-04 DIAGNOSIS — R944 Abnormal results of kidney function studies: Secondary | ICD-10-CM

## 2023-04-04 NOTE — Assessment & Plan Note (Signed)
Patient is currently enrolled in lung cancer screening.  He remains abstinent.

## 2023-04-04 NOTE — Assessment & Plan Note (Signed)
I reviewed GFR for multiple years he started to show a decline around 2018.  His GFR's have mostly been in the mid 50s and above, but most recently was 51.  Urinalysis did not show any protein or blood.  I did not see a UMA.  He used to smoke in the past and took NSAIDs.  He has also had several dye loads.  A renal ultrasound from this year did not show any medical renal disease or asymmetric kidneys.  He will discuss his renal status with his PCP.  I also recommend discussing this with cardiothoracic team as he will be undergoing possible repair which will involve dye exposure.  We also discussed avoidance of nephrotoxins and maintaining adequate hydration.  We will defer further workup to primary care team.  He may need ambulatory blood pressure monitoring as there are some discrepancies between his nighttime blood pressures and blood pressures in the office.

## 2023-04-04 NOTE — Assessment & Plan Note (Addendum)
His cardiovascular risk is elevated.  His father had a heart attack at the age of 69 he was a smoker. I noticed that he has calcifications in his coronary arteries on CT imaging.  He also has a history of hypertension, prediabetes, possible stage IIIa kidney disease and hyperlipidemia.  His LDL cholesterol is well-controlled on current dose of statin he is also exercising and is working on reducing saturated fats in his diet.  He is on antiplatelet therapy.  He will continue on current regimen for cardiovascular risk reduction.  I recommend ongoing monitoring of his blood pressure for goal of less than 120/80 if tolerated

## 2023-04-04 NOTE — Assessment & Plan Note (Signed)
Patient has lost 7 pounds, with reduction in body fat and preservation of muscle mass.  He is feeling hungry and does not feel satiated so we will increase his calories by 200.  He may incorporate healthy fats with his salads.  We also advised on increasing portions of vegetables as these are low calorie foods.  He continues to exercise but is unable to do strengthening because of his severe AS.

## 2023-04-04 NOTE — Assessment & Plan Note (Signed)
On CPAP with reported good compliance. Continue PAP therapy. Losing 15% or more of body weight may improve AHI.    

## 2023-04-04 NOTE — Assessment & Plan Note (Signed)
 LDL is at goal. Elevated LDL may be secondary to nutrition, genetics and spillover effect from excess adiposity. Recommended LDL goal is <70 to reduce the risk of fatty streaks and the progression to obstructive ASCVD in the future.   Lab Results  Component Value Date   CHOL 113 10/19/2022   HDL 45.90 10/19/2022   LDLCALC 56 10/19/2022   TRIG 57.0 10/19/2022   CHOLHDL 2 10/19/2022   He is currently on atorvastatin 10 mg once a day without any adverse effects.  He will continue statin therapy.  Continue weight loss therapy, losing 10% or more of body weight may improve condition. Also advised to reduce saturated fats in diet to less than 10% of daily calories.

## 2023-04-04 NOTE — Assessment & Plan Note (Signed)
He has had 2 consecutive fasting blood sugars above 100 with normal range hemoglobin A1c and mildly elevated insulin levels consistent with mild insulin resistance.  Patient counseled on disease state and risk of progression as well as complications.  He was provided a handout on this today.  He will continue to work on reducing simple and added sugars in his diet and weight loss.  He may be a candidate for pharmacoprophylaxis with metformin or GLP-1 therapy.

## 2023-04-04 NOTE — Progress Notes (Signed)
Office: [phone]  /  Fax: [phone]  Weight Summary And Biometrics  Vitals Temp: (!) 97.5 F (36.4 C) BP: (!) 148/93 (Pt reports WCS) Pulse Rate: 69 SpO2: 96 %   Anthropometric Measurements Height: 5\' 9"  (1.753 m) Weight: 240 lb (108.9 kg) BMI (Calculated): 35.43 Weight at Last Visit: 247 lb Weight Lost Since Last Visit: 7 lb Weight Gained Since Last Visit: 0 lb Starting Weight: 247 lb Total Weight Loss (lbs): 7 lb (3.175 kg) Peak Weight: 285 lb   Body Composition  Body Fat %: 29.1 % Fat Mass (lbs): 70 lbs Muscle Mass (lbs): 162.4 lbs Total Body Water (lbs): 111.6 lbs Visceral Fat Rating : 18   The ASCVD Risk score (Arnett DK, et al., 2019) failed to calculate for the following reasons:   The valid total cholesterol range is 130 to 320 mg/dL  No data recorded Today's Visit #: 2  Starting Date: 03/21/23   Subjective   Chief Complaint: Obesity  Steven Sloan is here to discuss his progress with his obesity treatment plan. He is on the the Category 4 Plan and states he is following his eating plan approximately 95 % of the time. He states he is exercising 60 minutes 5 times per week.  Interval History:   Discussed the use of AI scribe software for clinical note transcription with the patient, who gave verbal consent to proceed.  History of Present Illness    The patient, a 62 year old individual with obesity, presents for a follow-up visit for weight management. He has a history of hypertension, hyperlipidemia, decreased GFR, severe aortic stenosis, and obstructive sleep apnea managed with CPAP. The patient has been monitoring his blood pressure at home, with readings ranging from 123/75 to 135/60. He reports feeling "woozy" and hungry, even after meals, which may be related to his recent dietary changes for weight management.  The patient's kidney function has been decreasing over the past few years, with a recent GFR of 51, indicating stage 3A chronic kidney  disease. He has an upcoming appointment with his primary care provider to discuss this further. The patient has a history of significant ibuprofen use, which may have contributed to his kidney disease.  The patient has also been diagnosed with prediabetes, with fasting blood sugars consistently above 100. He has a small degree of insulin resistance, indicated by slightly elevated insulin levels. The patient has recently lost seven pounds through dietary changes and is working on increasing his physical activity levels.  The patient has a history of smoking for 30 years but quit in 2017. His father had a heart attack at the age of 45, which increases his cardiovascular risk. He has been found to have plaque buildup in his arteries and is on atorvastatin for management.  The patient also reports a history of aortic stenosis and is preparing for a procedure related to this. He has been advised to ensure his healthcare providers are aware of his kidney function before undergoing any procedures involving dye.  The patient is due for a colonoscopy and is up-to-date with lung cancer screening due to his smoking history. He has also been advised to undergo screening for abdominal aortic aneurysm at the age of 17.      Orexigenic Control:  Reports problems with appetite and hunger signals.  Reports problems with satiety and satiation.  Denies problems with eating patterns and portion control.  Denies abnormal cravings. Denies feeling deprived or restricted.   Barriers identified: medical comorbidities.   Pharmacotherapy for weight  loss: He is currently taking no anti-obesity medication.   Assessment and Plan   Treatment Plan For Obesity:  Recommended Dietary Goals  Steven Sloan is currently in the action stage of change. As such, his goal is to continue weight management plan. He has agreed to: continue current plan  Behavioral Intervention  We discussed the following Behavioral Modification  Strategies today: continue to work on maintaining a reduced calorie state, getting the recommended amount of protein, incorporating whole foods, making healthy choices, staying well hydrated and practicing mindfulness when eating..  Additional resources provided today: Handout on traveling and holiday eating strategies and Handout on hyperinsulinemia and health risks  Recommended Physical Activity Goals  Steven Sloan has been advised to work up to 150 minutes of moderate intensity aerobic activity a week and strengthening exercises 2-3 times per week for cardiovascular health, weight loss maintenance and preservation of muscle mass.   He has agreed to :  Continue current level of physical activity   Pharmacotherapy  We discussed various medication options to help Steven Sloan with his weight loss efforts and we both agreed to : continue with nutritional and behavioral strategies  Associated Conditions Addressed Today  Essential hypertension, benign Assessment & Plan: His blood pressure was elevated again today.  He has been monitoring blood pressure but mostly in the evening and these have been consistently under 130/80.  He is currently on lisinopril and has had a reduction in GFR since 2018 and likely has stage IIIa chronic kidney disease.  I feel that he would benefit from ambulatory blood pressure monitoring as he may have hypertensive kidney disease.  He also has discrepancy between arms and has been evaluated by vascular in the past.  In addition he notes some symptoms of orthostatic dizziness likely due to terazosin and inadequate fluid intake.  I advised him to check his blood pressure during these episodes and to increase hydration.  He will discuss decline in kidney function with his primary care provider in the near future.   OSA (obstructive sleep apnea) Assessment & Plan: On CPAP with reported good compliance. Continue PAP therapy. Losing 15% or more of body weight may improve AHI.       Class 2 severe obesity with serious comorbidity and body mass index (BMI) of 36.0 to 36.9 in adult, unspecified obesity type [facility]) Assessment & Plan: Patient has lost 7 pounds, with reduction in body fat and preservation of muscle mass.  He is feeling hungry and does not feel satiated so we will increase his calories by 200.  He may incorporate healthy fats with his salads.  We also advised on increasing portions of vegetables as these are low calorie foods.  He continues to exercise but is unable to do strengthening because of his severe AS.   Decreased GFR Assessment & Plan: I reviewed GFR for multiple years he started to show a decline around 2018.  His GFR's have mostly been in the mid 50s and above, but most recently was 51.  Urinalysis did not show any protein or blood.  I did not see a UMA.  He used to smoke in the past and took NSAIDs.  He has also had several dye loads.  A renal ultrasound from this year did not show any medical renal disease or asymmetric kidneys.  He will discuss his renal status with his PCP.  I also recommend discussing this with cardiothoracic team as he will be undergoing possible repair which will involve dye exposure.  We also discussed avoidance  of nephrotoxins and maintaining adequate hydration.  We will defer further workup to primary care team.  He may need ambulatory blood pressure monitoring as there are some discrepancies between his nighttime blood pressures and blood pressures in the office.   Pure hypercholesterolemia Assessment & Plan: LDL is at goal. Elevated LDL may be secondary to nutrition, genetics and spillover effect from excess adiposity. Recommended LDL goal is <70 to reduce the risk of fatty streaks and the progression to obstructive ASCVD in the future.   Lab Results  Component Value Date   CHOL 113 10/19/2022   HDL 45.90 10/19/2022   LDLCALC 56 10/19/2022   TRIG 57.0 10/19/2022   CHOLHDL 2 10/19/2022   He is currently on  atorvastatin 10 mg once a day without any adverse effects.  He will continue statin therapy.  Continue weight loss therapy, losing 10% or more of body weight may improve condition. Also advised to reduce saturated fats in diet to less than 10% of daily calories.        At increased risk for cardiovascular disease Assessment & Plan: His cardiovascular risk is elevated.  His father had a heart attack at the age of 75 he was a smoker. I noticed that he has calcifications in his coronary arteries on CT imaging.  He also has a history of hypertension, prediabetes, possible stage IIIa kidney disease and hyperlipidemia.  His LDL cholesterol is well-controlled on current dose of statin he is also exercising and is working on reducing saturated fats in his diet.  He is on antiplatelet therapy.  He will continue on current regimen for cardiovascular risk reduction.  I recommend ongoing monitoring of his blood pressure for goal of less than 120/80 if tolerated   Prediabetes Assessment & Plan: He has had 2 consecutive fasting blood sugars above 100 with normal range hemoglobin A1c and mildly elevated insulin levels consistent with mild insulin resistance.  Patient counseled on disease state and risk of progression as well as complications.  He was provided a handout on this today.  He will continue to work on reducing simple and added sugars in his diet and weight loss.  He may be a candidate for pharmacoprophylaxis with metformin or GLP-1 therapy.   Quit smoking Assessment & Plan: Patient is currently enrolled in lung cancer screening.  He remains abstinent.     General Health Maintenance He is up to date with colonoscopy and lung cancer screening, with a scheduled colonoscopy on the 13th and an annual low-dose CT scan for lung cancer screening. We discussed the importance of hydration and diet and scheduled AAA screening at age 3. He will continue annual low-dose CT scans for lung cancer screening,  schedule AAA screening at age 35, and maintain hydration and a balanced diet.        Objective   Physical Exam:  Blood pressure (!) 148/93, pulse 69, temperature (!) 97.5 F (36.4 C), height 5\' 9"  (1.753 m), weight 240 lb (108.9 kg), SpO2 96%. Body mass index is 35.44 kg/m.  General: He is overweight, cooperative, alert, well developed, and in no acute distress. PSYCH: Has normal mood, affect and thought process.   HEENT: EOMI, sclerae are anicteric. Lungs: Normal breathing effort, no conversational dyspnea. Extremities: No edema.  Neurologic: No gross sensory or motor deficits. No tremors or fasciculations noted.    Diagnostic Data Reviewed:  BMET    Component Value Date/Time   NA 141 03/17/2023 1044   K 4.3 03/17/2023 1044   CL  107 03/17/2023 1044   CO2 26 03/17/2023 1044   GLUCOSE 106 (H) 03/17/2023 1044   BUN 27 (H) 03/17/2023 1044   CREATININE 1.48 03/17/2023 1044   CALCIUM 9.9 03/17/2023 1044   GFRNONAA >60 04/09/2015 1140   GFRAA >60 04/09/2015 1140   Lab Results  Component Value Date   HGBA1C 5.3 03/21/2023   HGBA1C 5.0 07/22/2015   Lab Results  Component Value Date   INSULIN 12.6 03/21/2023   Lab Results  Component Value Date   TSH 0.76 05/20/2021   CBC    Component Value Date/Time   WBC 6.7 03/17/2023 1044   RBC 4.92 03/17/2023 1044   HGB 15.5 03/17/2023 1044   HCT 45.4 03/17/2023 1044   PLT 199.0 03/17/2023 1044   MCV 92.4 03/17/2023 1044   MCH 31.8 04/09/2015 1140   MCHC 34.0 03/17/2023 1044   RDW 12.5 03/17/2023 1044   Iron Studies No results found for: "IRON", "TIBC", "FERRITIN", "IRONPCTSAT" Lipid Panel     Component Value Date/Time   CHOL 113 10/19/2022 0958   TRIG 57.0 10/19/2022 0958   HDL 45.90 10/19/2022 0958   CHOLHDL 2 10/19/2022 0958   VLDL 11.4 10/19/2022 0958   LDLCALC 56 10/19/2022 0958   Hepatic Function Panel     Component Value Date/Time   PROT 7.8 12/07/2022 0933   ALBUMIN 4.7 12/07/2022 0933   AST 20  12/07/2022 0933   ALT 25 12/07/2022 0933   ALKPHOS 51 12/07/2022 0933   BILITOT 1.0 12/07/2022 0933   BILIDIR 0.2 12/07/2022 0933      Component Value Date/Time   TSH 0.76 05/20/2021 1028   Nutritional Lab Results  Component Value Date   VD25OH 40.9 03/21/2023    Follow-Up   Return in about 4 weeks (around 05/02/2023) for For Weight Mangement with Dr. Rikki Spearing.Marland Kitchen He was informed of the importance of frequent follow up visits to maximize his success with intensive lifestyle modifications for his multiple health conditions.  Attestation Statement   Reviewed by clinician on day of visit: allergies, medications, problem list, medical history, surgical history, family history, social history, and previous encounter notes.   I have spent 40 minutes in the care of the patient today including: preparing to see patient (e.g. review and interpretation of tests, old notes ), obtaining and/or reviewing separately obtained history, performing a medically appropriate examination or evaluation, counseling and educating the patient, documenting clinical information in the electronic or other health care record, and independently interpreting results and communicating results to the patient, family, or caregiver   Worthy Rancher, MD

## 2023-04-04 NOTE — Assessment & Plan Note (Signed)
His blood pressure was elevated again today.  He has been monitoring blood pressure but mostly in the evening and these have been consistently under 130/80.  He is currently on lisinopril and has had a reduction in GFR since 2018 and likely has stage IIIa chronic kidney disease.  I feel that he would benefit from ambulatory blood pressure monitoring as he may have hypertensive kidney disease.  He also has discrepancy between arms and has been evaluated by vascular in the past.  In addition he notes some symptoms of orthostatic dizziness likely due to terazosin and inadequate fluid intake.  I advised him to check his blood pressure during these episodes and to increase hydration.  He will discuss decline in kidney function with his primary care provider in the near future.

## 2023-04-05 NOTE — Progress Notes (Unsigned)
Virtual Visit via Telephone Note   Because of Steven Sloan co-morbid illnesses, he is at least at moderate risk for complications without adequate follow up.  This format is felt to be most appropriate for this patient at this time.  The patient did not have access to video technology/had technical difficulties with video requiring transitioning to audio format only (telephone).  All issues noted in this document were discussed and addressed.  No physical exam could be performed with this format.  Please refer to the patient's chart for his consent to telehealth for [facility].  Evaluation Performed:  Preoperative cardiovascular risk assessment _____________   Date:  04/05/2023   Patient ID:  Steven Sloan, DOB June 17, 1960, MRN [id number] Patient Location:  Home Provider location:   Office  Primary Care Provider:  Shade Flood, MD Primary Cardiologist:  Thurmon Fair, MD  Chief Complaint / Patient Profile   62 y.o. y/o male with a h/o aortic valve stenosis, hypertension, obesity who is pending colonoscopy and presents today for telephonic preoperative cardiovascular risk assessment.  History of Present Illness    Steven Sloan is a 62 y.o. male who presents via audio/video conferencing for a telehealth visit today.  Pt was last seen in cardiology clinic on 01/11/2023 by Dr. Royann Shivers.  At that time Steven Sloan was doing well .  The patient is now pending procedure as outlined above. Since his last visit, he remains stable from a cardiac standpoint.  Today he denies chest pain, shortness of breath, lower extremity edema, fatigue, palpitations, melena, hematuria, hemoptysis, diaphoresis, weakness, presyncope, syncope, orthopnea, and PND.   Past Medical History    Past Medical History:  Diagnosis Date   Allergy    Seasonal   Anxiety    Aortic valve stenosis 08/10/2022   severe   Chicken pox    Heart murmur    Asymptomatic -- Patient endorses previous  negative evaluation   High cholesterol    Hypertension    Sleep apnea    CPAP nightly   Past Surgical History:  Procedure Laterality Date   COLONOSCOPY     WISDOM TOOTH EXTRACTION      Allergies  No Known Allergies  Home Medications    Prior to Admission medications   Medication Sig Start Date End Date Taking? Authorizing Provider  aspirin EC 81 MG tablet Take 81 mg by mouth daily.    [provider]  atorvastatin (LIPITOR) 10 MG tablet Take 1 tablet (10 mg total) by mouth daily. 10/17/22   Shade Flood, MD  Azelastine HCl 137 MCG/SPRAY SOLN INSTILL 1-2 SPRAYS INTO BOTH NOSTRILS 2 TIMES DAILY AS DIRECTED 04/03/23   Shade Flood, MD  chlorhexidine (PERIDEX) 0.12 % solution  02/06/23   [provider]  Ciclopirox 0.77 % gel APPLY TO AFFECTED AREA TWICE A DAY 12/08/22   Shade Flood, MD  fluticasone [facility]) 50 MCG/ACT nasal spray USE 2 SPRAYS IN BOTH  NOSTRILS DAILY 01/11/22   Shade Flood, MD  lisinopril (ZESTRIL) 10 MG tablet TAKE 1 TABLET BY MOUTH DAILY 09/16/22   Shade Flood, MD  Na Sulfate-K Sulfate-Mg Sulf (SUPREP BOWEL PREP KIT) 17.5-3.13-1.6 GM/177ML SOLN Take 1 kit by mouth as directed. For colonoscopy prep 03/17/23   Mansouraty, Netty Starring., MD  psyllium (REGULOID) 0.52 g capsule Take 0.52 g by mouth daily.    [provider]  terazosin (HYTRIN) 1 MG capsule TAKE 1 CAPSULE BY MOUTH AT  BEDTIME 09/16/22   Meredith Staggers  R, MD  terbinafine (LAMISIL) 250 MG tablet Take 1 tablet (250 mg total) by mouth daily. 10/17/22   Shade Flood, MD    Physical Exam    Vital Signs:  Eliana Friedel does not have vital signs available for review today.  Given telephonic nature of communication, physical exam is limited. AAOx3. NAD. Normal affect.  Speech and respirations are unlabored.  Accessory Clinical Findings    None  Assessment & Plan    1.  Preoperative Cardiovascular Risk Assessment: Colonoscopy 04/14/2023, [place]  [facility], fax #[phone]      Primary Cardiologist: Thurmon Fair, MD  Chart reviewed as part of pre-operative protocol coverage. Given past medical history and time since last visit, based on ACC/AHA guidelines, Rayshaud Rudnik would be at acceptable risk for the planned procedure without further cardiovascular testing.   Patient was advised that if he develops new symptoms prior to surgery to contact our office to arrange a follow-up appointment.  He verbalized understanding.  His aspirin should be continued throughout his surgical procedure.  I will route this recommendation to the requesting party via Epic fax function and remove from pre-op pool.       Time:   Today, I have spent 5 minutes with the patient with telehealth technology discussing medical history, symptoms, and management plan.     Ronney Asters, NP  04/05/2023, 8:41 AM    Prior to patient's phone evaluation I spent greater than 10 minutes reviewing their past medical history and cardiac medications.

## 2023-04-06 ENCOUNTER — Ambulatory Visit: Payer: BC Managed Care – PPO | Attending: Cardiovascular Disease

## 2023-04-06 ENCOUNTER — Ambulatory Visit: Payer: BC Managed Care – PPO | Admitting: Behavioral Health

## 2023-04-06 DIAGNOSIS — Z01810 Encounter for preprocedural cardiovascular examination: Secondary | ICD-10-CM | POA: Diagnosis not present

## 2023-04-10 ENCOUNTER — Ambulatory Visit: Payer: BC Managed Care – PPO | Admitting: Family Medicine

## 2023-04-10 ENCOUNTER — Encounter: Payer: Self-pay | Admitting: Family Medicine

## 2023-04-10 VITALS — BP 138/78 | HR 60 | Temp 98.0°F | Ht 69.0 in | Wt 249.2 lb

## 2023-04-10 DIAGNOSIS — F419 Anxiety disorder, unspecified: Secondary | ICD-10-CM | POA: Diagnosis not present

## 2023-04-10 DIAGNOSIS — E785 Hyperlipidemia, unspecified: Secondary | ICD-10-CM

## 2023-04-10 DIAGNOSIS — I10 Essential (primary) hypertension: Secondary | ICD-10-CM

## 2023-04-10 DIAGNOSIS — R002 Palpitations: Secondary | ICD-10-CM

## 2023-04-10 DIAGNOSIS — I350 Nonrheumatic aortic (valve) stenosis: Secondary | ICD-10-CM | POA: Diagnosis not present

## 2023-04-10 LAB — LIPID PANEL
Cholesterol: 115 mg/dL (ref 0–200)
HDL: 45.4 mg/dL (ref >39.00)
LDL Cholesterol: 54 mg/dL (ref 0–99)
NonHDL: 69.64
Total CHOL/HDL Ratio: 3
Triglycerides: 78 mg/dL (ref 0.0–149.0)
VLDL: 15.6 mg/dL (ref 0.0–40.0)

## 2023-04-10 LAB — COMPREHENSIVE METABOLIC PANEL
ALT: 24 U/L (ref 0–53)
AST: 23 U/L (ref 0–37)
Albumin: 4.8 g/dL (ref 3.5–5.2)
Alkaline Phosphatase: 51 U/L (ref 39–117)
BUN: 28 mg/dL — ABNORMAL HIGH (ref 6–23)
CO2: 25 meq/L (ref 19–32)
Calcium: 10.1 mg/dL (ref 8.4–10.5)
Chloride: 103 meq/L (ref 96–112)
Creatinine, Ser: 1.34 mg/dL (ref 0.40–1.50)
GFR: 57.75 mL/min — ABNORMAL LOW (ref >60.00)
Glucose, Bld: 100 mg/dL — ABNORMAL HIGH (ref 70–99)
Potassium: 4.1 meq/L (ref 3.5–5.1)
Sodium: 139 meq/L (ref 135–145)
Total Bilirubin: 1.4 mg/dL — ABNORMAL HIGH (ref 0.2–1.2)
Total Protein: 7.9 g/dL (ref 6.0–8.3)

## 2023-04-10 LAB — CBC
HCT: 46.9 % (ref 39.0–52.0)
Hemoglobin: 15.9 g/dL (ref 13.0–17.0)
MCHC: 34 g/dL (ref 30.0–36.0)
MCV: 91.8 fL (ref 78.0–100.0)
Platelets: 224 10*3/uL (ref 150.0–400.0)
RBC: 5.11 Mil/uL (ref 4.22–5.81)
RDW: 12.9 % (ref 11.5–15.5)
WBC: 6.4 10*3/uL (ref 4.0–10.5)

## 2023-04-10 LAB — TSH: TSH: 0.78 u[IU]/mL (ref 0.35–5.50)

## 2023-04-10 MED ORDER — SERTRALINE HCL 25 MG PO TABS: 25.0000 mg | ORAL_TABLET | Freq: Every day | ORAL | 3 refills | Status: DC

## 2023-04-10 MED ORDER — HYDROXYZINE HCL 10 MG PO TABS: 10.0000 mg | ORAL_TABLET | Freq: Three times a day (TID) | ORAL | 0 refills | Status: DC | PRN

## 2023-04-10 NOTE — Progress Notes (Signed)
Subjective:  Patient ID: Steven Sloan, male    DOB: 01/03/1961  Age: 62 y.o. MRN: [id number]  CC:  Chief Complaint  Patient presents with   Medical Management of Chronic Issues    2 month, pt brought BP monitor today to compare. Pt has to have bicusbid valve replaced in a few months and thinks he is giving himself panic attacks due to episodes of heart thumping, has been seeing a counselor and has been helping but needs something for immediate relief     HPI Jaquis Dupree presents for   Anxiety, situational stress See previous visits.  Significant stressors with work, spouse's health and his surgery in the near future with his own health and need for likely aortic valve replacement.  Management techniques discussed including meeting with the EAP program or therapist, FMLA option if needed.  We discussed medication as option but deferred initially.  Discussed October 21 on video visit.  He was working on mindfulness and other ways to manage his stressors, some improvement at that time. Has been meeting with therapist, Serafina Mitchell, last visit November 21 noted in chart. Anxiety attacks occurring up to few days at a time as above.  Anxiety has been persistent. Feeling worse when he made some diet changes. Tried to increase fluid intake.      04/10/2023    9:42 AM 02/20/2023    8:35 AM 12/07/2022    8:35 AM 10/17/2022    9:22 AM 04/14/2022   10:51 AM  Depression screen PHQ 2/9  Decreased Interest 0 0 0 0 0  Down, Depressed, Hopeless 1 1 1  0 0  PHQ - 2 Score 1 1 1  0 0  Altered sleeping 1  0 0 0  Tired, decreased energy 0  0 0 0  Change in appetite 0  1 0 0  Feeling bad or failure about yourself  0  1 0 0  Trouble concentrating 0  1 0 0  Moving slowly or fidgety/restless 0  0 0 0  Suicidal thoughts 0  0 0 0  PHQ-9 Score 2  4 0 0        04/10/2023    9:42 AM 12/07/2022    8:35 AM 10/17/2022    9:22 AM  GAD 7 : Generalized Anxiety Score  Nervous, Anxious, on Edge 2 2 0   Control/stop worrying 2 2 0  Worry too much - different things 2 2 0  Trouble relaxing 1 0 0  Restless 0 0 0  Easily annoyed or irritable 1 2 0  Afraid - awful might happen 2 2 0  Total GAD 7 Score 10 10 0       Hypertension: With aortic valve stenosis, followed by Dr. Royann Shivers.  Potential repair next year, denies any chest pain, fatigue or dyspnea on exertion.  Hypertension treated with lisinopril 10 mg daily and on Terrazas and 1 mg daily.  He is on CPAP for OSA. Elevated creatinine/trends prior - nephrology eval in January. CKD 3a - stable, baseline around 1.4, maintenance of hydration and perioperative monitoring if left heart cath needed prior to AV replacement.  Home readings: 101-143(at time of panic attack)/ 62-77. Usually low 100-120's systolic.  No Cp/DOE - walked 4 miles yesterday.  Usually 2 miles.  Pounding sensation in heart when having anxiety attack. No other palpitations.   BP Readings from Last 3 Encounters:  04/10/23 138/78  04/04/23 (!) 148/93  03/21/23 (!) 161/80   Lab Results  Component Value Date  CREATININE 1.48 03/17/2023   Hyperlipidemia: Lipitor 10 mg daily and on daily aspirin 81 mg.  Denies any bleeding new myalgias or new side effects with his medications.  Followed by healthy weight and wellness.  Wt Readings from Last 3 Encounters:  04/10/23 249 lb 3.2 oz (113 kg)  04/04/23 240 lb (108.9 kg)  03/21/23 247 lb (112 kg)     Lab Results  Component Value Date   CHOL 113 10/19/2022   HDL 45.90 10/19/2022   LDLCALC 56 10/19/2022   TRIG 57.0 10/19/2022   CHOLHDL 2 10/19/2022   Lab Results  Component Value Date   ALT 25 12/07/2022   AST 20 12/07/2022   ALKPHOS 51 12/07/2022   BILITOT 1.0 12/07/2022     Colonoscopy planned in 4 days, preop cardiovascular screening December 5 by cardiology, plan for continuance of aspirin.   History Patient Active Problem List   Diagnosis Date Noted   At increased risk for cardiovascular disease  04/04/2023   Prediabetes 04/04/2023   Decreased GFR 03/21/2023   Hyperlipidemia 03/21/2023   Hx of adenomatous colonic polyps 03/20/2023   Severe aortic stenosis 03/20/2023   Emphysema lung (HCC) 01/13/2022   Systolic murmur 01/11/2019   Class 2 severe obesity with serious comorbidity and body mass index (BMI) of 36.0 to 36.9 in adult [facility]) 08/13/2018   Prostate cancer screening 06/07/2017   Internal hemorrhoid 06/07/2017   Visit for preventive health examination 07/26/2015   Quit smoking 07/01/2015   OSA (obstructive sleep apnea) 06/10/2015   Essential hypertension, benign 06/10/2015   Past Medical History:  Diagnosis Date   Allergy    Seasonal   Anxiety    Aortic valve stenosis 08/10/2022   severe   Chicken pox    Heart murmur    Asymptomatic -- Patient endorses previous negative evaluation   High cholesterol    Hypertension    Sleep apnea    CPAP nightly   Past Surgical History:  Procedure Laterality Date   COLONOSCOPY     WISDOM TOOTH EXTRACTION     No Known Allergies Prior to Admission medications   Medication Sig Start Date End Date Taking? Authorizing Provider  aspirin EC 81 MG tablet Take 81 mg by mouth daily.   Yes [provider]  atorvastatin (LIPITOR) 10 MG tablet Take 1 tablet (10 mg total) by mouth daily. 10/17/22  Yes Shade Flood, MD  Azelastine HCl 137 MCG/SPRAY SOLN INSTILL 1-2 SPRAYS INTO BOTH NOSTRILS 2 TIMES DAILY AS DIRECTED 04/03/23  Yes Shade Flood, MD  chlorhexidine (PERIDEX) 0.12 % solution  02/06/23  Yes [provider]  Ciclopirox 0.77 % gel APPLY TO AFFECTED AREA TWICE A DAY 12/08/22  Yes Shade Flood, MD  fluticasone [facility]) 50 MCG/ACT nasal spray USE 2 SPRAYS IN BOTH  NOSTRILS DAILY 01/11/22  Yes Shade Flood, MD  lisinopril (ZESTRIL) 10 MG tablet TAKE 1 TABLET BY MOUTH DAILY 09/16/22  Yes Shade Flood, MD  Na Sulfate-K Sulfate-Mg Sulf (SUPREP BOWEL PREP KIT) 17.5-3.13-1.6 GM/177ML SOLN Take 1 kit by  mouth as directed. For colonoscopy prep 03/17/23  Yes Mansouraty, Netty Starring., MD  psyllium (REGULOID) 0.52 g capsule Take 0.52 g by mouth daily.   Yes [provider]  terazosin (HYTRIN) 1 MG capsule TAKE 1 CAPSULE BY MOUTH AT  BEDTIME 09/16/22  Yes Shade Flood, MD  terbinafine (LAMISIL) 250 MG tablet Take 1 tablet (250 mg total) by mouth daily. 10/17/22  Yes Shade Flood, MD  Social History   Socioeconomic History   Marital status: Married    Spouse name: Baxter Hire   Number of children: Not on file   Years of education: Not on file   Highest education level: Master's degree (e.g., MA, MS, MEng, MEd, MSW, MBA)  Occupational History   Not on file  Tobacco Use   Smoking status: Former    Current packs/day: 0.00    Average packs/day: 0.3 packs/day for 25.0 years (6.3 ttl pk-yrs)    Types: Cigarettes    Start date: 05/17/1990    Quit date: 05/18/2015    Years since quitting: 7.9   Smokeless tobacco: Never  Vaping Use   Vaping status: Never Used  Substance and Sexual Activity   Alcohol use: Yes    Alcohol/week: 2.0 - 4.0 standard drinks of alcohol    Types: 2 - 4 Standard drinks or equivalent per week    Comment: social   Drug use: Not Currently    Types: Marijuana   Sexual activity: Yes  Other Topics Concern   Not on file  Social History Narrative   Not on file   Social Determinants of Health   Financial Resource Strain: Low Risk  (04/09/2023)   Overall Financial Resource Strain (CARDIA)    Difficulty of Paying Living Expenses: Not hard at all  Food Insecurity: No Food Insecurity (04/09/2023)   Hunger Vital Sign    Worried About Running Out of Food in the Last Year: Never true    Ran Out of Food in the Last Year: Never true  Transportation Needs: No Transportation Needs (04/09/2023)   PRAPARE - Administrator, Civil Service (Medical): No    Lack of Transportation (Non-Medical): No  Physical Activity: Sufficiently Active (04/09/2023)   Exercise  Vital Sign    Days of Exercise per Week: 5 days    Minutes of Exercise per Session: 60 min  Stress: Stress Concern Present (04/09/2023)   [employer] of Occupational Health - Occupational Stress Questionnaire    Feeling of Stress : To some extent  Social Connections: Socially Isolated (04/09/2023)   Social Connection and Isolation Panel [NHANES]    Frequency of Communication with Friends and Family: Once a week    Frequency of Social Gatherings with Friends and Family: Once a week    Attends Religious Services: Never    Database administrator or Organizations: No    Attends Engineer, structural: Not on file    Marital Status: Married  Catering manager Violence: Not on file    Review of Systems Per HPI.   Objective:   Vitals:   04/10/23 0944  BP: 138/78  Pulse: 60  Temp: 98 F (36.7 C)  TempSrc: Temporal  SpO2: 98%  Weight: 249 lb 3.2 oz (113 kg)  Height: 5\' 9"  (1.753 m)     Physical Exam Vitals reviewed.  Constitutional:      General: He is not in acute distress.    Appearance: Normal appearance. He is well-developed. He is not ill-appearing, toxic-appearing or diaphoretic.  HENT:     Head: Normocephalic and atraumatic.  Neck:     Vascular: No carotid bruit or JVD.  Cardiovascular:     Rate and Rhythm: Normal rate and regular rhythm.     Heart sounds: Murmur (3-4/6 systolic.) heard.  Pulmonary:     Effort: Pulmonary effort is normal.     Breath sounds: Normal breath sounds. No rales.  Abdominal:     Tenderness: There  is no abdominal tenderness.  Musculoskeletal:     Right lower leg: No edema.     Left lower leg: No edema.  Skin:    General: Skin is warm and dry.  Neurological:     Mental Status: He is alert and oriented to person, place, and time.  Psychiatric:        Mood and Affect: Mood normal.        Behavior: Behavior normal.        Thought Content: Thought content normal.    EKG: Sinus bradycardia, rate 52.  Incomplete right bundle  branch block, LAFB.  Voltage criteria for LVH.  Compared to the 01/11/2023 EKG, rate 53 at that time.  No apparent significant changes from that EKG or apparent acute findings today.   Assessment & Plan:  Rueger Weidel is a 62 y.o. male . Essential hypertension, benign - Plan: Comprehensive metabolic panel  -Borderline but improved in office.  No med changes for now, continue monitoring, continue follow-up with cardiology as planned.  Severe aortic stenosis  -Symptoms with exertion at this time, continue follow-up with cardiology and evaluation if new exertional symptoms  Hyperlipidemia, unspecified hyperlipidemia type - Plan: Comprehensive metabolic panel, Lipid panel  -Check labs and adjust meds accordingly  Anxiety - Plan: sertraline (ZOLOFT) 25 MG tablet, hydrOXYzine (ATARAX) 10 MG tablet Palpitations - Plan: CBC, TSH, EKG 12-Lead  -Few episodes of palpitations during anxiety versus panic attack.  No other symptoms with exertion or exercise.  No apparent acute changes with EKG in office and asymptomatic at present.  Check labs above.  ER/RTC precautions.  -Persistent anxiety symptoms.  Continue counseling, start low-dose sertraline with hydroxyzine for breakthrough anxiety symptoms with potential side effects discussed, 1 month follow-up.  Meds ordered this encounter  Medications   sertraline (ZOLOFT) 25 MG tablet    Sig: Take 1 tablet (25 mg total) by mouth daily.    Dispense:  30 tablet    Refill:  3   hydrOXYzine (ATARAX) 10 MG tablet    Sig: Take 1 tablet (10 mg total) by mouth 3 (three) times daily as needed for anxiety.    Dispense:  30 tablet    Refill:  0   Patient Instructions  I will check some labs for the heart palpitations but it sounds like those are just with anxiety.  If you notice those continue I would recommend discussing with your cardiologist, especially if any symptoms when you are not anxious.  If any new shortness of breath, chest pain or feeling more winded  with activity, let your cardiologist know right away.  See information given below on managing anxiety symptoms.  We can start low-dose sertraline for now to see if that will help bring down the anxiety levels but continue to meet with your therapist.  Hydroxyzine was also prescribed for short-term anxiety treatment if needed in the interim.  We will follow-up in 4 weeks but happy to meet with you sooner if needed to adjust meds or if you have difficulty with these medications.  Overall blood pressures look okay.  Not worried about the mild elevation when you are having the anxiety/panic symptoms.  Again medications above should help lessen the frequency of the anxiety symptoms.  Thanks for coming in today and take care.  Managing Anxiety, Adult After being diagnosed with anxiety, you may be relieved to know why you have felt or behaved a certain way. You may also feel overwhelmed about the treatment ahead and what it  will mean for your life. With care and support, you can manage your anxiety. How to manage lifestyle changes Understanding the difference between stress and anxiety Although stress can play a role in anxiety, it is not the same as anxiety. Stress is your body's reaction to life changes and events, both good and bad. Stress is often caused by something external, such as a deadline, test, or competition. It normally goes away after the event has ended and will last just a few hours. But, stress can be ongoing and can lead to more than just stress. Anxiety is caused by something internal, such as imagining a terrible outcome or worrying that something will go wrong that will greatly upset you. Anxiety often does not go away even after the event is over, and it can become a long-term (chronic) worry. Lowering stress and anxiety Talk with your health care provider or a counselor to learn more about lowering anxiety and stress. They may suggest tension-reduction techniques, such as: Music.  Spend time creating or listening to music that you enjoy and that inspires you. Mindfulness-based meditation. Practice being aware of your normal breaths while not trying to control your breathing. It can be done while sitting or walking. Centering prayer. Focus on a word, phrase, or sacred image that means something to you and brings you peace. Deep breathing. Expand your stomach and inhale slowly through your nose. Hold your breath for 3-5 seconds. Then breathe out slowly, letting your stomach muscles relax. Self-talk. Learn to notice and spot thought patterns that lead to anxiety reactions. Change those patterns to thoughts that feel peaceful. Muscle relaxation. Take time to tense muscles and then relax them. Choose a tension-reduction technique that fits your lifestyle and personality. These techniques take time and practice. Set aside 5-15 minutes a day to do them. Specialized therapists can offer counseling and training in these techniques. The training to help with anxiety may be covered by some insurance plans. Other things you can do to manage stress and anxiety include: Keeping a stress diary. This can help you learn what triggers your reaction and then learn ways to manage your response. Thinking about how you react to certain situations. You may not be able to control everything, but you can control your response. Making time for activities that help you relax and not feeling guilty about spending your time in this way. Doing visual imagery. This involves imagining or creating mental pictures to help you relax. Practicing yoga. Through yoga poses, you can lower tension and relax.  Medicines Medicines for anxiety include: Antidepressant medicines. These are usually prescribed for long-term daily control. Anti-anxiety medicines. These may be added in severe cases, especially when panic attacks occur. When used together, medicines, psychotherapy, and tension-reduction techniques may be the  most effective treatment. Relationships Relationships can play a big part in helping you recover. Spend more time connecting with trusted friends and family members. Think about going to couples counseling if you have a partner, taking family education classes, or going to family therapy. Therapy can help you and others better understand your anxiety. How to recognize changes in your anxiety Everyone responds differently to treatment for anxiety. Recovery from anxiety happens when symptoms lessen and stop interfering with your daily life at home or work. This may mean that you will start to: Have better concentration and focus. Worry will interfere less in your daily thinking. Sleep better. Be less irritable. Have more energy. Have improved memory. Try to recognize when your condition is  getting worse. Contact your provider if your symptoms interfere with home or work and you feel like your condition is not improving. Follow these instructions at home: Activity Exercise. Adults should: Exercise for at least 150 minutes each week. The exercise should increase your heart rate and make you sweat (moderate-intensity exercise). Do strengthening exercises at least twice a week. Get the right amount and quality of sleep. Most adults need 7-9 hours of sleep each night. Lifestyle  Eat a healthy diet that includes plenty of vegetables, fruits, whole grains, low-fat dairy products, and lean protein. Do not eat a lot of foods that are high in fats, added sugars, or salt (sodium). Make choices that simplify your life. Do not use any products that contain nicotine or tobacco. These products include cigarettes, chewing tobacco, and vaping devices, such as e-cigarettes. If you need help quitting, ask your provider. Avoid caffeine, alcohol, and certain over-the-counter cold medicines. These may make you feel worse. Ask your pharmacist which medicines to avoid. General instructions Take over-the-counter and  prescription medicines only as told by your provider. Keep all follow-up visits. This is to make sure you are managing your anxiety well or if you need more support. Where to find support You can get help and support from: Self-help groups. Online and [employer]. A trusted spiritual leader. Couples counseling. Family education classes. Family therapy. Where to find more information You may find that joining a support group helps you deal with your anxiety. The following sources can help you find counselors or support groups near you: Mental Health America: mentalhealthamerica.net Anxiety and Depression Association of [place] (ADAA): adaa.org The [employer] on Mental Illness (NAMI): nami.org Contact a health care provider if: You have a hard time staying focused or finishing tasks. You spend many hours a day feeling worried about everyday life. You are very tired because you cannot stop worrying. You start to have headaches or often feel tense. You have chronic nausea or diarrhea. Get help right away if: Your heart feels like it is racing. You have shortness of breath. You have thoughts of hurting yourself or others. Get help right away if you feel like you may hurt yourself or others, or have thoughts about taking your own life. Go to your nearest emergency room or: Call 911. Call the National Suicide Prevention Lifeline at (651)884-3623 or 988. This is open 24 hours a day. Text the Crisis Text Line at 206 534 8321. This information is not intended to replace advice given to you by your health care provider. Make sure you discuss any questions you have with your health care provider. Document Revised: 01/25/2022 Document Reviewed: 08/09/2020 Elsevier Patient Education  2024 Elsevier Inc.       Signed,   Meredith Staggers, MD [facility], [facility] 04/10/23 10:33 AM

## 2023-04-10 NOTE — Patient Instructions (Addendum)
I will check some labs for the heart palpitations but it sounds like those are just with anxiety.  If you notice those continue I would recommend discussing with your cardiologist, especially if any symptoms when you are not anxious.  If any new shortness of breath, chest pain or feeling more winded with activity, let your cardiologist know right away.  See information given below on managing anxiety symptoms.  We can start low-dose sertraline for now to see if that will help bring down the anxiety levels but continue to meet with your therapist.  Hydroxyzine was also prescribed for short-term anxiety treatment if needed in the interim.  We will follow-up in 4 weeks but happy to meet with you sooner if needed to adjust meds or if you have difficulty with these medications.  Overall blood pressures look okay.  Not worried about the mild elevation when you are having the anxiety/panic symptoms.  Again medications above should help lessen the frequency of the anxiety symptoms.  Thanks for coming in today and take care.  Managing Anxiety, Adult After being diagnosed with anxiety, you may be relieved to know why you have felt or behaved a certain way. You may also feel overwhelmed about the treatment ahead and what it will mean for your life. With care and support, you can manage your anxiety. How to manage lifestyle changes Understanding the difference between stress and anxiety Although stress can play a role in anxiety, it is not the same as anxiety. Stress is your body's reaction to life changes and events, both good and bad. Stress is often caused by something external, such as a deadline, test, or competition. It normally goes away after the event has ended and will last just a few hours. But, stress can be ongoing and can lead to more than just stress. Anxiety is caused by something internal, such as imagining a terrible outcome or worrying that something will go wrong that will greatly upset you.  Anxiety often does not go away even after the event is over, and it can become a long-term (chronic) worry. Lowering stress and anxiety Talk with your health care provider or a counselor to learn more about lowering anxiety and stress. They may suggest tension-reduction techniques, such as: Music. Spend time creating or listening to music that you enjoy and that inspires you. Mindfulness-based meditation. Practice being aware of your normal breaths while not trying to control your breathing. It can be done while sitting or walking. Centering prayer. Focus on a word, phrase, or sacred image that means something to you and brings you peace. Deep breathing. Expand your stomach and inhale slowly through your nose. Hold your breath for 3-5 seconds. Then breathe out slowly, letting your stomach muscles relax. Self-talk. Learn to notice and spot thought patterns that lead to anxiety reactions. Change those patterns to thoughts that feel peaceful. Muscle relaxation. Take time to tense muscles and then relax them. Choose a tension-reduction technique that fits your lifestyle and personality. These techniques take time and practice. Set aside 5-15 minutes a day to do them. Specialized therapists can offer counseling and training in these techniques. The training to help with anxiety may be covered by some insurance plans. Other things you can do to manage stress and anxiety include: Keeping a stress diary. This can help you learn what triggers your reaction and then learn ways to manage your response. Thinking about how you react to certain situations. You may not be able to control everything, but you  can control your response. Making time for activities that help you relax and not feeling guilty about spending your time in this way. Doing visual imagery. This involves imagining or creating mental pictures to help you relax. Practicing yoga. Through yoga poses, you can lower tension and  relax.  Medicines Medicines for anxiety include: Antidepressant medicines. These are usually prescribed for long-term daily control. Anti-anxiety medicines. These may be added in severe cases, especially when panic attacks occur. When used together, medicines, psychotherapy, and tension-reduction techniques may be the most effective treatment. Relationships Relationships can play a big part in helping you recover. Spend more time connecting with trusted friends and family members. Think about going to couples counseling if you have a partner, taking family education classes, or going to family therapy. Therapy can help you and others better understand your anxiety. How to recognize changes in your anxiety Everyone responds differently to treatment for anxiety. Recovery from anxiety happens when symptoms lessen and stop interfering with your daily life at home or work. This may mean that you will start to: Have better concentration and focus. Worry will interfere less in your daily thinking. Sleep better. Be less irritable. Have more energy. Have improved memory. Try to recognize when your condition is getting worse. Contact your provider if your symptoms interfere with home or work and you feel like your condition is not improving. Follow these instructions at home: Activity Exercise. Adults should: Exercise for at least 150 minutes each week. The exercise should increase your heart rate and make you sweat (moderate-intensity exercise). Do strengthening exercises at least twice a week. Get the right amount and quality of sleep. Most adults need 7-9 hours of sleep each night. Lifestyle  Eat a healthy diet that includes plenty of vegetables, fruits, whole grains, low-fat dairy products, and lean protein. Do not eat a lot of foods that are high in fats, added sugars, or salt (sodium). Make choices that simplify your life. Do not use any products that contain nicotine or tobacco. These  products include cigarettes, chewing tobacco, and vaping devices, such as e-cigarettes. If you need help quitting, ask your provider. Avoid caffeine, alcohol, and certain over-the-counter cold medicines. These may make you feel worse. Ask your pharmacist which medicines to avoid. General instructions Take over-the-counter and prescription medicines only as told by your provider. Keep all follow-up visits. This is to make sure you are managing your anxiety well or if you need more support. Where to find support You can get help and support from: Self-help groups. Online and [employer]. A trusted spiritual leader. Couples counseling. Family education classes. Family therapy. Where to find more information You may find that joining a support group helps you deal with your anxiety. The following sources can help you find counselors or support groups near you: Mental Health America: mentalhealthamerica.net Anxiety and Depression Association of [place] (ADAA): adaa.org The [employer] American on Mental Illness (NAMI): nami.org Contact a health care provider if: You have a hard time staying focused or finishing tasks. You spend many hours a day feeling worried about everyday life. You are very tired because you cannot stop worrying. You start to have headaches or often feel tense. You have chronic nausea or diarrhea. Get help right away if: Your heart feels like it is racing. You have shortness of breath. You have thoughts of hurting yourself or others. Get help right away if you feel like you may hurt yourself or others, or have thoughts about taking your own life. Go to  your nearest emergency room or: Call 911. Call the National Suicide Prevention Lifeline at 646 245 8634 or 988. This is open 24 hours a day. Text the Crisis Text Line at 581 238 5037. This information is not intended to replace advice given to you by your health care provider. Make sure you discuss any questions you have  with your health care provider. Document Revised: 01/25/2022 Document Reviewed: 08/09/2020 Elsevier Patient Education  2024 [employer].

## 2023-04-14 ENCOUNTER — Ambulatory Visit (HOSPITAL_COMMUNITY): Payer: BC Managed Care – PPO | Admitting: Anesthesiology

## 2023-04-14 ENCOUNTER — Other Ambulatory Visit: Payer: Self-pay

## 2023-04-14 ENCOUNTER — Encounter (HOSPITAL_COMMUNITY)
Admission: RE | Disposition: A | Discharge status: Home / Self Care | Payer: Self-pay | Source: Home / Self Care | Attending: Gastroenterology

## 2023-04-14 ENCOUNTER — Ambulatory Visit (HOSPITAL_COMMUNITY)
Admission: RE | Admit: 2023-04-14 | Discharge: 2023-04-14 | Disposition: A | Discharge status: Home / Self Care | Payer: BC Managed Care – PPO | Attending: Gastroenterology | Admitting: Gastroenterology

## 2023-04-14 ENCOUNTER — Encounter (HOSPITAL_COMMUNITY): Payer: Self-pay | Admitting: Gastroenterology

## 2023-04-14 DIAGNOSIS — Z1211 Encounter for screening for malignant neoplasm of colon: Secondary | ICD-10-CM

## 2023-04-14 DIAGNOSIS — K641 Second degree hemorrhoids: Secondary | ICD-10-CM

## 2023-04-14 DIAGNOSIS — J449 Chronic obstructive pulmonary disease, unspecified: Secondary | ICD-10-CM | POA: Insufficient documentation

## 2023-04-14 DIAGNOSIS — D127 Benign neoplasm of rectosigmoid junction: Secondary | ICD-10-CM | POA: Diagnosis not present

## 2023-04-14 DIAGNOSIS — I080 Rheumatic disorders of both mitral and aortic valves: Secondary | ICD-10-CM | POA: Insufficient documentation

## 2023-04-14 DIAGNOSIS — F419 Anxiety disorder, unspecified: Secondary | ICD-10-CM | POA: Insufficient documentation

## 2023-04-14 DIAGNOSIS — K635 Polyp of colon: Secondary | ICD-10-CM

## 2023-04-14 DIAGNOSIS — Z87891 Personal history of nicotine dependence: Secondary | ICD-10-CM | POA: Diagnosis not present

## 2023-04-14 DIAGNOSIS — I517 Cardiomegaly: Secondary | ICD-10-CM | POA: Diagnosis not present

## 2023-04-14 DIAGNOSIS — Z860100 Personal history of colon polyps, unspecified: Secondary | ICD-10-CM

## 2023-04-14 DIAGNOSIS — I10 Essential (primary) hypertension: Secondary | ICD-10-CM | POA: Insufficient documentation

## 2023-04-14 DIAGNOSIS — G4730 Sleep apnea, unspecified: Secondary | ICD-10-CM | POA: Insufficient documentation

## 2023-04-14 DIAGNOSIS — K644 Residual hemorrhoidal skin tags: Secondary | ICD-10-CM

## 2023-04-14 DIAGNOSIS — K5730 Diverticulosis of large intestine without perforation or abscess without bleeding: Secondary | ICD-10-CM | POA: Diagnosis not present

## 2023-04-14 HISTORY — PX: POLYPECTOMY: SHX5525

## 2023-04-14 HISTORY — PX: COLONOSCOPY WITH PROPOFOL: SHX5780

## 2023-04-14 SURGERY — COLONOSCOPY WITH PROPOFOL
Anesthesia: Monitor Anesthesia Care

## 2023-04-14 MED ORDER — PROPOFOL 10 MG/ML IV BOLUS
INTRAVENOUS | Status: DC | PRN
  Administered 2023-04-14: 30 mg via INTRAVENOUS
  Administered 2023-04-14: 20 mg via INTRAVENOUS

## 2023-04-14 MED ORDER — SODIUM CHLORIDE 0.9 % IV SOLN: INTRAVENOUS | Status: DC | PRN

## 2023-04-14 MED ORDER — PROPOFOL 500 MG/50ML IV EMUL
INTRAVENOUS | Status: DC | PRN
  Administered 2023-04-14: 100 ug/kg/min via INTRAVENOUS

## 2023-04-14 SURGICAL SUPPLY — 20 items

## 2023-04-14 NOTE — Anesthesia Preprocedure Evaluation (Addendum)
Anesthesia Evaluation  Patient identified by MRN, date of birth, ID band Patient awake    Reviewed: Allergy & Precautions, NPO status , Patient's Chart, lab work & pertinent test results  Airway Mallampati: II  TM Distance: >3 FB Neck ROM: Full    Dental no notable dental hx. (+) Teeth Intact, Dental Advisory Given   Pulmonary sleep apnea and Continuous Positive Airway Pressure Ventilation , COPD, former smoker   Pulmonary exam normal breath sounds clear to auscultation       Cardiovascular hypertension, Pt. on medications Normal cardiovascular exam+ Valvular Problems/Murmurs (severe AS, no symptoms) AS  Rhythm:Regular Rate:Normal  TTE 2024 1. Left ventricular ejection fraction, by estimation, is 60 to 65%. The  left ventricle has normal function. The left ventricle has no regional  wall motion abnormalities. There is moderate concentric left ventricular  hypertrophy. Left ventricular  diastolic parameters are consistent with Grade II diastolic dysfunction  (pseudonormalization). The average left ventricular global longitudinal  strain is 16.9 %. The global longitudinal strain is abnormal.   2. Right ventricular systolic function is normal. The right ventricular  size is normal. Tricuspid regurgitation signal is inadequate for assessing  PA pressure.   3. The mitral valve is normal in structure. Mild mitral valve  regurgitation. No evidence of mitral stenosis.   4. The aortic valve was not well visualized. There is severe calcifcation  of the aortic valve. Aortic valve regurgitation is mild. Severe aortic  valve stenosis. Aortic valve area, by VTI measures 0.86 cm. Aortic valve  mean gradient measures 50.0 mmHg.   5. The inferior vena cava is normal in size with greater than 50%  respiratory variability, suggesting right atrial pressure of 3 mmHg.     Neuro/Psych  PSYCHIATRIC DISORDERS Anxiety     negative neurological ROS      GI/Hepatic negative GI ROS, Neg liver ROS,,,  Endo/Other  negative endocrine ROS    Renal/GU negative Renal ROS  negative genitourinary   Musculoskeletal negative musculoskeletal ROS (+)    Abdominal   Peds  Hematology negative hematology ROS (+)   Anesthesia Other Findings   Reproductive/Obstetrics                             Anesthesia Physical Anesthesia Plan  ASA: 4  Anesthesia Plan: MAC   Post-op Pain Management:    Induction: Intravenous  PONV Risk Score and Plan: Propofol infusion and Treatment may vary due to age or medical condition  Airway Management Planned: Natural Airway  Additional Equipment:   Intra-op Plan:   Post-operative Plan:   Informed Consent: I have reviewed the patients History and Physical, chart, labs and discussed the procedure including the risks, benefits and alternatives for the proposed anesthesia with the patient or authorized representative who has indicated his/her understanding and acceptance.     Dental advisory given  Plan Discussed with: CRNA  Anesthesia Plan Comments:        Anesthesia Quick Evaluation

## 2023-04-14 NOTE — Op Note (Signed)
[facility] Patient Name: Steven Sloan Procedure Date : 04/14/2023 MRN: [id number] Attending MD: Corliss Parish , MD, [id number] Date of Birth: [birth year] CSN: [id number] Age: 62 Admit Type: Inpatient Procedure:                Colonoscopy Indications:              Surveillance: Personal history of adenomatous                            polyps on last colonoscopy > 5 years ago Providers:                Corliss Parish, MD, Lorenza Evangelist, RN,                            Rozetta Nunnery, Technician Referring MD:             Asencion Partridge. Neva Seat, MD Medicines:                Monitored Anesthesia Care Complications:            No immediate complications. Estimated Blood Loss:     Estimated blood loss was minimal. Procedure:                Pre-Anesthesia Assessment:                           - Prior to the procedure, a History and Physical                            was performed, and patient medications and                            allergies were reviewed. The patient's tolerance of                            previous anesthesia was also reviewed. The risks                            and benefits of the procedure and the sedation                            options and risks were discussed with the patient.                            All questions were answered, and informed consent                            was obtained. Prior Anticoagulants: The patient has                            taken no anticoagulant or antiplatelet agents                            except for aspirin. ASA Grade Assessment: III - A  patient with severe systemic disease. After                            reviewing the risks and benefits, the patient was                            deemed in satisfactory condition to undergo the                            procedure.                           After obtaining informed consent, the colonoscope                             was passed under direct vision. Throughout the                            procedure, the patient's blood pressure, pulse, and                            oxygen saturations were monitored continuously. The                            CF-HQ190L ([id number]) Olympus coloscope was                            introduced through the anus and advanced to the the                            cecum, identified by appendiceal orifice and                            ileocecal valve. The colonoscopy was performed                            without difficulty. The patient tolerated the                            procedure. The quality of the bowel preparation was                            adequate. The terminal ileum, ileocecal valve,                            appendiceal orifice, and rectum were photographed. Scope In: 12:25:01 PM Scope Out: 12:37:26 PM Scope Withdrawal Time: 0 hours 9 minutes 54 seconds  Total Procedure Duration: 0 hours 12 minutes 25 seconds  Findings:      The digital rectal exam findings include hemorrhoids. Pertinent       negatives include no palpable rectal lesions.      A 5 mm polyp was found in the recto-sigmoid colon. The polyp was       sessile. The polyp was removed with a cold snare. Resection and  retrieval were complete.      Multiple small-mouthed diverticula were found in the recto-sigmoid       colon, sigmoid colon, descending colon and transverse colon.      Normal mucosa was found in the entire colon otherwise.      Non-bleeding non-thrombosed external and internal hemorrhoids were found       during retroflexion, during perianal exam and during digital exam. The       hemorrhoids were Grade II (internal hemorrhoids that prolapse but reduce       spontaneously). Impression:               - Hemorrhoids found on digital rectal exam.                           - One 5 mm polyp at the recto-sigmoid colon,                            removed with a cold snare. Resected  and retrieved.                           - Diverticulosis in the recto-sigmoid colon, in the                            sigmoid colon, in the descending colon and in the                            transverse colon.                           - Normal mucosa in the entire examined colon                            othewise.                           - Non-bleeding non-thrombosed external and internal                            hemorrhoids. Recommendation:           - The patient will be observed post-procedure,                            until all discharge criteria are met.                           - Discharge patient to home.                           - Patient has a contact number available for                            emergencies. The signs and symptoms of potential                            delayed complications were discussed with the  patient. Return to normal activities tomorrow.                            Written discharge instructions were provided to the                            patient.                           - High fiber diet.                           - Use FiberCon 1-2 tablets PO daily.                           - Continue present medications.                           - Await pathology results.                           - Repeat colonoscopy in 5-7 years for surveillance                            based on pathology results.                           - The findings and recommendations were discussed                            with the patient.                           - The findings and recommendations were discussed                            with the patient's family. Procedure Code(s):        --- Professional ---                           [phone], Colonoscopy, flexible; with removal of                            tumor(s), polyp(s), or other lesion(s) by snare                            technique Diagnosis Code(s):        --- Professional  ---                           Z86.010, Personal history of colonic polyps                           D12.7, Benign neoplasm of rectosigmoid junction                           K64.1, Second degree hemorrhoids  K57.30, Diverticulosis of large intestine without                            perforation or abscess without bleeding CPT copyright 2022 American Medical Association. All rights reserved. The codes documented in this report are preliminary and upon coder review may  be revised to meet current compliance requirements. Corliss Parish, MD 04/14/2023 12:45:47 PM Number of Addenda: 0

## 2023-04-14 NOTE — H&P (Signed)
GASTROENTEROLOGY PROCEDURE H&P NOTE   Primary Care Physician: Shade Flood, MD  HPI: Steven Sloan is a 62 y.o. male who presents for Colonoscopy for surveillance of previous adenomatous colon polyps.  Past Medical History:  Diagnosis Date   Allergy    Seasonal   Anxiety    Aortic valve stenosis 08/10/2022   severe   Chicken pox    Heart murmur    Asymptomatic -- Patient endorses previous negative evaluation   High cholesterol    Hypertension    Sleep apnea    CPAP nightly   Past Surgical History:  Procedure Laterality Date   COLONOSCOPY     WISDOM TOOTH EXTRACTION     No current facility-administered medications for this encounter.   No current facility-administered medications for this encounter. No Known Allergies Family History  Problem Relation Age of Onset   Heart attack Father 4   Heart disease Father    High Cholesterol Father    Alcohol abuse Sister    Cancer Maternal Grandmother 42       Unsure   Heart attack Paternal Grandfather 64   Heart attack Paternal Uncle 38   Colon cancer Neg Hx    Colon polyps Neg Hx    Esophageal cancer Neg Hx    Rectal cancer Neg Hx    Stomach cancer Neg Hx    Inflammatory bowel disease Neg Hx    Liver disease Neg Hx    Pancreatic cancer Neg Hx    Social History   Socioeconomic History   Marital status: Married    Spouse name: Radiographer, therapeutic   Number of children: Not on file   Years of education: Not on file   Highest education level: Master's degree (e.g., MA, MS, MEng, MEd, MSW, MBA)  Occupational History   Not on file  Tobacco Use   Smoking status: Former    Current packs/day: 0.00    Average packs/day: 0.3 packs/day for 25.0 years (6.3 ttl pk-yrs)    Types: Cigarettes    Start date: 05/17/1990    Quit date: 05/18/2015    Years since quitting: 7.9   Smokeless tobacco: Never  Vaping Use   Vaping status: Never Used  Substance and Sexual Activity   Alcohol use: Yes    Alcohol/week: 2.0 - 4.0 standard  drinks of alcohol    Types: 2 - 4 Standard drinks or equivalent per week    Comment: social   Drug use: Not Currently    Types: Marijuana   Sexual activity: Yes  Other Topics Concern   Not on file  Social History Narrative   Not on file   Social Drivers of Health   Financial Resource Strain: Low Risk  (04/09/2023)   Overall Financial Resource Strain (CARDIA)    Difficulty of Paying Living Expenses: Not hard at all  Food Insecurity: No Food Insecurity (04/09/2023)   Hunger Vital Sign    Worried About Running Out of Food in the Last Year: Never true    Ran Out of Food in the Last Year: Never true  Transportation Needs: No Transportation Needs (04/09/2023)   PRAPARE - Administrator, Civil Service (Medical): No    Lack of Transportation (Non-Medical): No  Physical Activity: Sufficiently Active (04/09/2023)   Exercise Vital Sign    Days of Exercise per Week: 5 days    Minutes of Exercise per Session: 60 min  Stress: Stress Concern Present (04/09/2023)   [employer] of Occupational Health - Occupational  Stress Questionnaire    Feeling of Stress : To some extent  Social Connections: Socially Isolated (04/09/2023)   Social Connection and Isolation Panel [NHANES]    Frequency of Communication with Friends and Family: Once a week    Frequency of Social Gatherings with Friends and Family: Once a week    Attends Religious Services: Never    Database administrator or Organizations: No    Attends Engineer, structural: Not on file    Marital Status: Married  Catering manager Violence: Not on file    Physical Exam: Today's Vitals   04/14/23 1101  BP: (!) 148/85  Pulse: 82  Resp: 15  Temp: 98.3 F (36.8 C)  TempSrc: Temporal  SpO2: 94%  Weight: 110.7 kg  Height: 5\' 9"  (1.753 m)  PainSc: 0-No pain   Body mass index is 36.03 kg/m. GEN: NAD EYE: Sclerae anicteric ENT: MMM CV: Non-tachycardic GI: Soft, NT/ND NEURO:  Alert & Oriented x 3  Lab  Results: No results for input(s): "WBC", "HGB", "HCT", "PLT" in the last 72 hours. BMET No results for input(s): "NA", "K", "CL", "CO2", "GLUCOSE", "BUN", "CREATININE", "CALCIUM" in the last 72 hours. LFT No results for input(s): "PROT", "ALBUMIN", "AST", "ALT", "ALKPHOS", "BILITOT", "BILIDIR", "IBILI" in the last 72 hours. PT/INR No results for input(s): "LABPROT", "INR" in the last 72 hours.   Impression / Plan: This is a 62 y.o.male who presents for Colonoscopy for surveillance of previous adenomatous colon polyps.  The risks and benefits of endoscopic evaluation/treatment were discussed with the patient and/or family; these include but are not limited to the risk of perforation, infection, bleeding, missed lesions, lack of diagnosis, severe illness requiring hospitalization, as well as anesthesia and sedation related illnesses.  The patient's history has been reviewed, patient examined, no change in status, and deemed stable for procedure.  The patient and/or family is agreeable to proceed.    Corliss Parish, MD [facility] Office # [phone]

## 2023-04-14 NOTE — Anesthesia Procedure Notes (Signed)
Procedure Name: MAC Date/Time: 04/14/2023 12:17 PM  Performed by: Gwenyth Allegra, CRNAPre-anesthesia Checklist: Patient identified, Emergency Drugs available, Suction available and Patient being monitored Patient Re-evaluated:Patient Re-evaluated prior to induction Oxygen Delivery Method: Nasal cannula Preoxygenation: Pre-oxygenation with 100% oxygen Induction Type: IV induction

## 2023-04-14 NOTE — Transfer of Care (Signed)
Immediate Anesthesia Transfer of Care Note  Patient: Steven Sloan  Procedure(s) Performed: COLONOSCOPY WITH PROPOFOL POLYPECTOMY  Patient Location: Endoscopy Unit  Anesthesia Type:MAC  Level of Consciousness: awake, alert , and oriented  Airway & Oxygen Therapy: Patient Spontanous Breathing  Post-op Assessment: Report given to RN and Post -op Vital signs reviewed and stable  Post vital signs: Reviewed and stable  Last Vitals:  Vitals Value Taken Time  BP 104/72 04/14/23 1243  Temp    Pulse 63 04/14/23 1247  Resp 22 04/14/23 1247  SpO2 91 % 04/14/23 1247  Vitals shown include unfiled device data.  Last Pain:  Vitals:   04/14/23 1101  TempSrc: Temporal  PainSc: 0-No pain         Complications: No notable events documented.

## 2023-04-14 NOTE — Discharge Instructions (Signed)

## 2023-04-16 NOTE — Anesthesia Postprocedure Evaluation (Signed)
Anesthesia Post Note  Patient: Ajene Kriel  Procedure(s) Performed: COLONOSCOPY WITH PROPOFOL POLYPECTOMY     Patient location during evaluation: Endoscopy Anesthesia Type: MAC Level of consciousness: awake and alert Pain management: pain level controlled Vital Signs Assessment: post-procedure vital signs reviewed and stable Respiratory status: spontaneous breathing, nonlabored ventilation, respiratory function stable and patient connected to nasal cannula oxygen Cardiovascular status: blood pressure returned to baseline and stable Postop Assessment: no apparent nausea or vomiting Anesthetic complications: no  No notable events documented.  Last Vitals:  Vitals:   04/14/23 1300 04/14/23 1305  BP: 131/76 131/87  Pulse: 60 60  Resp: 20 19  Temp:    SpO2: 93% 95%    Last Pain:  Vitals:   04/14/23 1305  TempSrc:   PainSc: 0-No pain                 Lindsey Hommel L Torrence Branagan

## 2023-04-17 ENCOUNTER — Encounter (HOSPITAL_COMMUNITY): Payer: Self-pay | Admitting: Gastroenterology

## 2023-04-17 LAB — SURGICAL PATHOLOGY

## 2023-04-18 ENCOUNTER — Encounter: Payer: Self-pay | Admitting: Gastroenterology

## 2023-04-19 NOTE — Progress Notes (Signed)
Patient viewed on 12/10. Reached out via my chart to see if he has any questions.

## 2023-04-20 ENCOUNTER — Ambulatory Visit: Payer: BC Managed Care – PPO | Admitting: Behavioral Health

## 2023-04-20 ENCOUNTER — Encounter: Payer: Self-pay | Admitting: Behavioral Health

## 2023-04-20 DIAGNOSIS — F411 Generalized anxiety disorder: Secondary | ICD-10-CM | POA: Diagnosis not present

## 2023-04-20 NOTE — Progress Notes (Signed)
[facility] Counselor/Therapist Progress Note  Patient ID: Steven Sloan, MRN: [id number],    Date: 04/20/2023  Time Spent: 60 minutes, 1 PM until 2 PM .This session was held via video teletherapy. The patient consented to the video teletherapy and was located in his home office during this session. He is aware it is the responsibility of the patient to secure confidentiality on his end of the session. The provider was in a private home office for the duration of this session.    Treatment Type: Individual Therapy  Reported Symptoms: Anxiety, stress, depression  Mental Status Exam: Appearance:  Well Groomed     Behavior: Appropriate  Motor: Normal  Speech/Language:  Normal Rate  Affect: Appropriate  Mood: normal  Thought process: normal  Thought content:   WNL  Sensory/Perceptual disturbances:   WNL  Orientation: oriented to person, place, time/date, situation, day of week, month of year, and year  Attention: Good  Concentration: Good  Memory: WNL  Fund of knowledge:  Good  Insight:   Good  Judgment:  Good  Impulse Control: Good   Risk Assessment: Danger to Self:  No Self-injurious Behavior: No Danger to Others: No Duty to Warn:no Physical Aggression / Violence:No  Access to Firearms a concern: No  Gang Involvement:No   Subjective: Thanksgiving went fairly well for the patient in relationship to his family gathering.  His wife did have knee replacement surgery and that seemed to go well.  He is that she is progressing.  He is tired because the most part he is doing everything around the house right now.  We looked more deeply at his relationship with his daughter and some of the frustrations that come with that.  He feels that she has made progress but he and his wife had a lot of work and getting things done including her doing simple things such as good hygiene and small chores around the house.  We looked at how he could encourage her but also set  boundaries with her.  He said that his anxiety level has been up in part due to the holidays and having so much to do by himself and he talked to his doctor about it.  He said it does not necessarily appear to be panic but it was definitely elevated anxiety.  Most of the things that he is using coping skills are helping but we talked about some different ways that he might be able to reframe some of the anxious thoughts as well as find some downtime for himself.  To his knowledge he still feels like things are going fairly well.  There are still temptations to look closely at every pain or 8 that he has but he knows the signs to look for. He does contract for safety having no thoughts of hurting himself or anyone else.  Interventions: Cognitive Behavioral Therapy and Dialectical Behavioral Therapy Diagnosis:Generalized anxiety disorder  Plan: I will meet with the patient every 2 to 3 weeks. Treatment plan: Goals are to reduce the patient's anxiety and some mild depression primarily related to medical condition but also other family factors by at least 50% with a target date of July 31, 2023.  Goals for minimizing depression are for the patient to have less sadness as indicated by his report and PHQ-9 scores, have improved mood and return to a healthier level of functioning.  We will look at any other causes for depression in addition to those stated above.  We will explore how depression  is experienced by him, use cognitive behavioral therapy to explore and replace unhealthy thought and behavior patterns contributing to depression as well as encouraging the sharing of feelings related to causes and symptoms of depression.  We will introduce coping skills for managing depressive symptoms.  Goals for reducing anxiety include improving his ability to better manage stress and anxiety symptoms, identify causes for anxiety and introduce ways to lower it including resolving core conflicts contributing to the  anxiety.  Finally a goal for the patient will be to manage thoughts and worrisome thinking contributing to feelings of anxiety.  Interventions include providing education about anxiety to help him understand his causes and triggers, facilitate problem solution skills and coping skills for managing anxiety.  We will also use cognitive behavioral therapy to identify and change anxiety provoking thought and behavior patterns as well as dialectical behavior therapy to introduce distress tolerance and mindfulness skills.  Progress: 25%  [place], [facility]                  [place], [facility]               French Ana, [facility]               French Ana, [facility]               French Ana, [facility]

## 2023-05-02 ENCOUNTER — Other Ambulatory Visit: Payer: Self-pay | Admitting: Family Medicine

## 2023-05-02 DIAGNOSIS — F419 Anxiety disorder, unspecified: Secondary | ICD-10-CM

## 2023-05-05 ENCOUNTER — Other Ambulatory Visit: Payer: Self-pay | Admitting: Family Medicine

## 2023-05-05 DIAGNOSIS — B351 Tinea unguium: Secondary | ICD-10-CM

## 2023-05-09 ENCOUNTER — Ambulatory Visit (INDEPENDENT_AMBULATORY_CARE_PROVIDER_SITE_OTHER): Payer: BC Managed Care – PPO | Admitting: Internal Medicine

## 2023-05-09 ENCOUNTER — Encounter (INDEPENDENT_AMBULATORY_CARE_PROVIDER_SITE_OTHER): Payer: Self-pay | Admitting: Internal Medicine

## 2023-05-09 VITALS — BP 138/75 | HR 64 | Temp 97.8°F | Ht 69.0 in | Wt 244.0 lb

## 2023-05-09 DIAGNOSIS — E66812 Obesity, class 2: Secondary | ICD-10-CM | POA: Diagnosis not present

## 2023-05-09 DIAGNOSIS — G4733 Obstructive sleep apnea (adult) (pediatric): Secondary | ICD-10-CM

## 2023-05-09 DIAGNOSIS — Z6836 Body mass index (BMI) 36.0-36.9, adult: Secondary | ICD-10-CM

## 2023-05-09 DIAGNOSIS — R7303 Prediabetes: Secondary | ICD-10-CM

## 2023-05-09 NOTE — Progress Notes (Signed)
 Office: [phone]  /  Fax: [phone]  Weight Summary And Biometrics  Vitals Temp: 97.8 F (36.6 C) BP: 138/75 Pulse Rate: 64 SpO2: 97 %   Anthropometric Measurements Height: 5' 9 (1.753 m) Weight: 244 lb (110.7 kg) BMI (Calculated): 36.02 Weight at Last Visit: 240 lb Weight Lost Since Last Visit: 0 lb Weight Gained Since Last Visit: 4 lb Starting Weight: 247 lb Total Weight Loss (lbs): 3 lb (1.361 kg) Peak Weight: 285 lb   Body Composition  Body Fat %: 30.5 % Fat Mass (lbs): 74.6 lbs Muscle Mass (lbs): 161.8 lbs Total Body Water (lbs): 118 lbs Visceral Fat Rating : 18    No data recorded Today's Visit #: 3  Starting Date: 03/21/23   Subjective   Chief Complaint: Obesity  Steven Sloan is here to discuss his progress with his obesity treatment plan. He is on the the Category 4 Plan and states he is following his eating plan approximately 40 % of the time. He states he is exercising 60 minutes 5 times per week.  Interval History:   Discussed the use of AI scribe software for clinical note transcription with the patient, who gave verbal consent to proceed.  History of Present Illness   The patient, affected by obesity, hypertension, obstructive sleep apnea, hypercholesterolemia, and prediabetes, presents for a medical weight management consultation. He reports a recent weight loss of approximately six to seven pounds, followed by a weight gain of three to four pounds over the holiday period. The patient attributes this weight fluctuation to dietary changes and increased stress due to personal circumstances, including caring for a spouse recovering from surgery.  The patient has been attempting to manage his weight through dietary changes, focusing on increasing vegetable intake and incorporating more complex carbohydrates. However, he expresses difficulty with nighttime snacking, attributing this to a combination of boredom, stress, and habit.  The patient also  discusses his severe sleep apnea, diagnosed through a sleep study. He is scheduled to see his primary care physician in two days to discuss kidney function. He expresses interest in Zepbound  which is now FDA-approved for weight management for the treatment of moderate to severe sleep apnea.  The patient's spouse has been using Zepbound  and has experienced significant weight loss. He expresses a willingness to consider medication as part of his weight management strategy, recognizing obesity as a chronic disease that can be treated with medication.       Orexigenic Control:  Reports problems with appetite and hunger signals.  Reports problems with satiety and satiation.  Denies problems with eating patterns and portion control.  Denies abnormal cravings. Denies feeling deprived or restricted.   Barriers identified: strong hunger signals and impaired satiety / inhibitory control, medical comorbidities, and sleep apnea.   Pharmacotherapy for weight loss: He is currently taking no anti-obesity medication and is interested in GLP-1 therapy .   Assessment and Plan   Treatment Plan For Obesity:  Recommended Dietary Goals  Steven Sloan is currently in the action stage of change. As such, his goal is to continue weight management plan. He has agreed to: continue current plan  Behavioral Intervention  We discussed the following Behavioral Modification Strategies today: continue to work on maintaining a reduced calorie state, getting the recommended amount of protein, incorporating whole foods, making healthy choices, staying well hydrated and practicing mindfulness when eating..  Additional resources provided today: None  Recommended Physical Activity Goals  Steven Sloan has been advised to work up to 150 minutes of moderate intensity  aerobic activity a week and strengthening exercises 2-3 times per week for cardiovascular health, weight loss maintenance and preservation of muscle mass.   He has  agreed to :  continue to gradually increase the amount and intensity of exercise routine  Pharmacotherapy  We discussed various medication options to help Steven Sloan with his weight loss efforts and we both agreed to : start anti-obesity medication.  In addition to reduced calorie nutrition plan (RCNP), behavioral strategies and physical activity, Steven Sloan would benefit from pharmacotherapy to assist with hunger signals, satiety and cravings. This will reduce obesity-related health risks by inducing weight loss, and help reduce food consumption and adherence to [facility]) . It may also improve QOL by improving self-confidence and reduce the  setbacks associated with metabolic adaptations.  He also has multiple obesity related /sensitive comorbid conditions including severe sleep apnea, hypertension, emphysema, prediabetes and possibly chronic kidney disease.  He is therefore a good candidate for GLP-1 therapy.  He would like to review this with his primary care provider at his next follow-up.  If we are all on board we will prescribe medication after informed decision making at the next office visit.   Associated Conditions Addressed and Impacted by Obesity Treatment  OSA (obstructive sleep apnea) Assessment & Plan: On CPAP with reported good compliance. Continue PAP therapy.  Losing 15% of body weight may reduce AHI.  We also reviewed that Zepbound  is now approved for weight loss in patients with moderate to severe obstructive sleep apnea.  Considering number of obesity related comorbid conditions I think he would be a good candidate.    Class 2 severe obesity with serious comorbidity and body mass index (BMI) of 36.0 to 36.9 in adult, unspecified obesity type [facility]) Assessment & Plan: He has had some weight fluctuations because of lapses in treatment over the holidays and is currently working on reimplementation of reduced calorie nutrition plan.  He has also been increasing his physical activity levels.   He does have some problems with hunger signals and satiety and may benefit from antiobesity medication.  Considering high risk comorbidities he is a good candidate for GLP-1.  We will discuss treatment initiation after he meets with his primary care provider.   Prediabetes Assessment & Plan: He has had 2 consecutive fasting blood sugars above 100 with normal range hemoglobin A1c and mildly elevated insulin  levels consistent with mild insulin  resistance.  Patient counseled on disease state and risk of progression as well as complications.  He has been educated on the carb insulin  model of obesity.  He will continue to work on reducing simple and added sugars in his diet and weight loss.  He may be a candidate for pharmacoprophylaxis with metformin or GLP-1 therapy for pharmacoprophylaxis           Objective   Physical Exam:  Blood pressure 138/75, pulse 64, temperature 97.8 F (36.6 C), height 5' 9 (1.753 m), weight 244 lb (110.7 kg), SpO2 97%. Body mass index is 36.03 kg/m.  General: He is overweight, cooperative, alert, well developed, and in no acute distress. PSYCH: Has normal mood, affect and thought process.   HEENT: EOMI, sclerae are anicteric. Lungs: Normal breathing effort, no conversational dyspnea. Extremities: No edema.  Neurologic: No gross sensory or motor deficits. No tremors or fasciculations noted.    Diagnostic Data Reviewed:  BMET    Component Value Date/Time   NA 139 04/10/2023 1034   K 4.1 04/10/2023 1034   CL 103 04/10/2023 1034   CO2  25 04/10/2023 1034   GLUCOSE 100 (H) 04/10/2023 1034   BUN 28 (H) 04/10/2023 1034   CREATININE 1.34 04/10/2023 1034   CALCIUM  10.1 04/10/2023 1034   GFRNONAA >60 04/09/2015 1140   GFRAA >60 04/09/2015 1140   Lab Results  Component Value Date   HGBA1C 5.3 03/21/2023   HGBA1C 5.0 07/22/2015   Lab Results  Component Value Date   INSULIN  12.6 03/21/2023   Lab Results  Component Value Date   TSH 0.78 04/10/2023    CBC    Component Value Date/Time   WBC 6.4 04/10/2023 1034   RBC 5.11 04/10/2023 1034   HGB 15.9 04/10/2023 1034   HCT 46.9 04/10/2023 1034   PLT 224.0 04/10/2023 1034   MCV 91.8 04/10/2023 1034   MCH 31.8 04/09/2015 1140   MCHC 34.0 04/10/2023 1034   RDW 12.9 04/10/2023 1034   Iron Studies No results found for: IRON, TIBC, FERRITIN, IRONPCTSAT Lipid Panel     Component Value Date/Time   CHOL 115 04/10/2023 1034   TRIG 78.0 04/10/2023 1034   HDL 45.40 04/10/2023 1034   CHOLHDL 3 04/10/2023 1034   VLDL 15.6 04/10/2023 1034   LDLCALC 54 04/10/2023 1034   Hepatic Function Panel     Component Value Date/Time   PROT 7.9 04/10/2023 1034   ALBUMIN 4.8 04/10/2023 1034   AST 23 04/10/2023 1034   ALT 24 04/10/2023 1034   ALKPHOS 51 04/10/2023 1034   BILITOT 1.4 (H) 04/10/2023 1034   BILIDIR 0.2 12/07/2022 0933      Component Value Date/Time   TSH 0.78 04/10/2023 1034   Nutritional Lab Results  Component Value Date   VD25OH 40.9 03/21/2023    Follow-Up   Return in about 3 weeks (around 05/30/2023) for For Weight Mangement with Dr. [email or web address].[email or web address] He was informed of the importance of frequent follow up visits to maximize his success with intensive lifestyle modifications for his multiple health conditions.  Attestation Statement   Reviewed by clinician on day of visit: allergies, medications, problem list, medical history, surgical history, family history, social history, and previous encounter notes.     Lucas Francyne, MD

## 2023-05-09 NOTE — Assessment & Plan Note (Signed)
 On CPAP with reported good compliance. Continue PAP therapy.  Losing 15% of body weight may reduce AHI.  We also reviewed that Zepbound  is now approved for weight loss in patients with moderate to severe obstructive sleep apnea.  Considering number of obesity related comorbid conditions I think he would be a good candidate.

## 2023-05-09 NOTE — Assessment & Plan Note (Signed)
 He has had 2 consecutive fasting blood sugars above 100 with normal range hemoglobin A1c and mildly elevated insulin  levels consistent with mild insulin  resistance.  Patient counseled on disease state and risk of progression as well as complications.  He has been educated on the carb insulin  model of obesity.  He will continue to work on reducing simple and added sugars in his diet and weight loss.  He may be a candidate for pharmacoprophylaxis with metformin or GLP-1 therapy for pharmacoprophylaxis

## 2023-05-09 NOTE — Assessment & Plan Note (Signed)
 He has had some weight fluctuations because of lapses in treatment over the holidays and is currently working on reimplementation of reduced calorie nutrition plan.  He has also been increasing his physical activity levels.  He does have some problems with hunger signals and satiety and may benefit from antiobesity medication.  Considering high risk comorbidities he is a good candidate for GLP-1.  We will discuss treatment initiation after he meets with his primary care provider.

## 2023-05-11 ENCOUNTER — Encounter: Payer: Self-pay | Admitting: Family Medicine

## 2023-05-11 ENCOUNTER — Telehealth (INDEPENDENT_AMBULATORY_CARE_PROVIDER_SITE_OTHER): Payer: BC Managed Care – PPO | Admitting: Family Medicine

## 2023-05-11 VITALS — BP 112/72 | HR 63

## 2023-05-11 DIAGNOSIS — R944 Abnormal results of kidney function studies: Secondary | ICD-10-CM

## 2023-05-11 DIAGNOSIS — F419 Anxiety disorder, unspecified: Secondary | ICD-10-CM

## 2023-05-11 MED ORDER — SERTRALINE HCL 50 MG PO TABS: 50.0000 mg | ORAL_TABLET | Freq: Every day | ORAL | 3 refills | Status: DC

## 2023-05-11 MED ORDER — HYDROXYZINE HCL 10 MG PO TABS: 10.0000 mg | ORAL_TABLET | Freq: Three times a day (TID) | ORAL | 5 refills | Status: DC | PRN

## 2023-05-11 NOTE — Progress Notes (Signed)
Pt was called and left message

## 2023-05-11 NOTE — Patient Instructions (Addendum)
 Good talking with you today. Glad to hear the sertraline  has been tolerated and I think you will do better with a higher dose.  50 mg dose sent to your pharmacy.  Let me know if you have any side effects on that medication.  Hydroxyzine  was also refilled.  Kidney function test has been overall stable, but may have some chronic decreased kidney function, and we can discuss this further at your next visit.  That can happen sometimes with longstanding high blood pressure, other chronic diseases.  I do not see a need to change medication at this time but I would like to follow-up and discuss that further next visit and can repeat testing at that time.  We also have the option of having you meet with a nephrologist or kidney specialist.  Again we can discuss at next visit but let me know if there are questions in the meantime.  I do not see any specific restrictions for you to take a medication like zepbound  but as with any medication would recommend discussing that with your heart specialist as well.  Let me know if there are further questions and take care!

## 2023-05-11 NOTE — Progress Notes (Signed)
 Virtual Visit via Video Note  I connected with Steven Sloan on 05/11/23 at 10:01 AM by a video enabled telemedicine application and verified that I am speaking with the correct person using two identifiers.  Patient location: home by self for call.  My location: office - [place].    I discussed the limitations, risks, security and privacy concerns of performing an evaluation and management service by telephone and the availability of in person appointments. I also discussed with the patient that there may be a patient responsible charge related to this service. The patient expressed understanding and agreed to proceed, consent obtained  Chief complaint:  Chief Complaint  Patient presents with   Medical Management of Chronic Issues    Pt is doing okay, no concerns at this time, Hydrozyzine and Sertraline  seems to be be working well but thinks he could do with another increase     History of Present Illness: Steven Sloan is a 63 y.o. male  Anxiety/situational stress. See previous visits, last visit in December.  Had been meeting with therapist at that time.  Still having some anxiety attacks up to a few days at a time, persistent anxiety at that time.  Previous diet changes had worsened his symptoms.  He was working on mindfulness and other ways to manage stressors.  Started on sertraline  25 mg daily.  Continued on hydroxyzine  for breakthrough anxiety symptoms.  Since last visit. Feels a little better on sertraline  - has taken edge off , but would like to try higher dose. No side effects.  Taking 1 hydroxyzine  in morning, sometimes once at lunch, not needed in evening. No sedation or side effects.  Still meeting with therapist. Going well.  Blood pressure ok.  Has been followed by weight mgt specialist - last visit on 1/7. Considering zepbound . Question of kidney function.  Cr 1.34 on 12/9, improved from 1.48 prior. EGFR 57. He is on Ace Inhibitor.  No current nsaids.  Drinking fluids during the day - trying to increase hydration.      05/11/2023    9:24 AM 04/10/2023    9:42 AM 02/20/2023    8:35 AM 12/07/2022    8:35 AM 10/17/2022    9:22 AM  Depression screen PHQ 2/9  Decreased Interest 0 0 0 0 0  Down, Depressed, Hopeless 1 1 1 1  0  PHQ - 2 Score 1 1 1 1  0  Altered sleeping 0 1  0 0  Tired, decreased energy  0  0 0  Change in appetite 0 0  1 0  Feeling bad or failure about yourself  0 0  1 0  Trouble concentrating 0 0  1 0  Moving slowly or fidgety/restless 0 0  0 0  Suicidal thoughts 0 0  0 0  PHQ-9 Score 1 2  4  0      05/11/2023    9:24 AM 04/10/2023    9:42 AM 12/07/2022    8:35 AM 10/17/2022    9:22 AM  GAD 7 : Generalized Anxiety Score  Nervous, Anxious, on Edge 1 2 2  0  Control/stop worrying 0 2 2 0  Worry too much - different things 1 2 2  0  Trouble relaxing 1 1 0 0  Restless 0 0 0 0  Easily annoyed or irritable 0 1 2 0  Afraid - awful might happen 1 2 2  0  Total GAD 7 Score 4 10 10  0       Patient Active Problem List  Diagnosis Date Noted   History of colonic polyps 04/14/2023   At increased risk for cardiovascular disease 04/04/2023   Prediabetes 04/04/2023   Decreased GFR 03/21/2023   Hyperlipidemia 03/21/2023   Hx of adenomatous colonic polyps 03/20/2023   Severe aortic stenosis 03/20/2023   Emphysema lung (HCC) 01/13/2022   Systolic murmur 01/11/2019   Class 2 severe obesity with serious comorbidity and body mass index (BMI) of 36.0 to 36.9 in adult [facility]) 08/13/2018   Prostate cancer screening 06/07/2017   Internal hemorrhoid 06/07/2017   Visit for preventive health examination 07/26/2015   Quit smoking 07/01/2015   OSA (obstructive sleep apnea) 06/10/2015   Essential hypertension, benign 06/10/2015   Past Medical History:  Diagnosis Date   Allergy    Seasonal   Anxiety    Aortic valve stenosis 08/10/2022   severe   Chicken pox    Heart murmur    Asymptomatic -- Patient endorses previous negative evaluation    High cholesterol    Hypertension    Sleep apnea    CPAP nightly   Past Surgical History:  Procedure Laterality Date   COLONOSCOPY     COLONOSCOPY WITH PROPOFOL  N/A 04/14/2023   Procedure: COLONOSCOPY WITH PROPOFOL ;  Surgeon: Wilhelmenia Aloha Raddle., MD;  Location: [facility] ENDOSCOPY;  Service: Gastroenterology;  Laterality: N/A;   POLYPECTOMY  04/14/2023   Procedure: POLYPECTOMY;  Surgeon: Wilhelmenia Aloha Raddle., MD;  Location: [facility] ENDOSCOPY;  Service: Gastroenterology;;   WISDOM TOOTH EXTRACTION     No Known Allergies Prior to Admission medications   Medication Sig Start Date End Date Taking? Authorizing Provider  aspirin EC 81 MG tablet Take 81 mg by mouth daily.   Yes [provider]  atorvastatin  (LIPITOR) 10 MG tablet Take 1 tablet (10 mg total) by mouth daily. 10/17/22  Yes Levora Reyes SAUNDERS, MD  Azelastine  HCl 137 MCG/SPRAY SOLN INSTILL 1-2 SPRAYS INTO BOTH NOSTRILS 2 TIMES DAILY AS DIRECTED 04/03/23  Yes Levora Reyes SAUNDERS, MD  chlorhexidine (PERIDEX) 0.12 % solution  02/06/23  Yes [provider]  Ciclopirox  0.77 % gel APPLY TO AFFECTED AREA TWICE A DAY 12/08/22  Yes Levora Reyes SAUNDERS, MD  fluticasone  (FLONASE ) 50 MCG/ACT nasal spray USE 2 SPRAYS IN BOTH  NOSTRILS DAILY 01/11/22  Yes Levora Reyes SAUNDERS, MD  hydrOXYzine  (ATARAX ) 10 MG tablet Take 1 tablet (10 mg total) by mouth 3 (three) times daily as needed for anxiety. 04/10/23  Yes Levora Reyes SAUNDERS, MD  lisinopril  (ZESTRIL ) 10 MG tablet TAKE 1 TABLET BY MOUTH DAILY 09/16/22  Yes Levora Reyes SAUNDERS, MD  psyllium (REGULOID) 0.52 g capsule Take 0.52 g by mouth daily.   Yes [provider]  sertraline  (ZOLOFT ) 25 MG tablet Take 1 tablet (25 mg total) by mouth daily. 04/10/23  Yes Levora Reyes SAUNDERS, MD  terazosin  (HYTRIN ) 1 MG capsule TAKE 1 CAPSULE BY MOUTH AT  BEDTIME 09/16/22  Yes Levora Reyes SAUNDERS, MD  terbinafine  (LAMISIL ) 250 MG tablet TAKE 1 TABLET BY MOUTH EVERY DAY 05/05/23  Yes Levora Reyes SAUNDERS, MD   Social  History   Socioeconomic History   Marital status: Married    Spouse name: Josette   Number of children: Not on file   Years of education: Not on file   Highest education level: Master's degree (e.g., MA, MS, MEng, MEd, MSW, MBA)  Occupational History   Not on file  Tobacco Use   Smoking status: Former    Current packs/day: 0.00    Average packs/day: 0.3 packs/day  for 25.0 years (6.3 ttl pk-yrs)    Types: Cigarettes    Start date: 05/17/1990    Quit date: 05/18/2015    Years since quitting: 7.9   Smokeless tobacco: Never  Vaping Use   Vaping status: Never Used  Substance and Sexual Activity   Alcohol use: Yes    Alcohol/week: 2.0 - 4.0 standard drinks of alcohol    Types: 2 - 4 Standard drinks or equivalent per week    Comment: social   Drug use: Not Currently    Types: Marijuana   Sexual activity: Yes  Other Topics Concern   Not on file  Social History Narrative   Not on file   Social Drivers of Health   Financial Resource Strain: Low Risk  (05/08/2023)   Overall Financial Resource Strain (CARDIA)    Difficulty of Paying Living Expenses: Not hard at all  Food Insecurity: No Food Insecurity (05/08/2023)   Hunger Vital Sign    Worried About Running Out of Food in the Last Year: Never true    Ran Out of Food in the Last Year: Never true  Transportation Needs: No Transportation Needs (05/08/2023)   PRAPARE - Administrator, Civil Service (Medical): No    Lack of Transportation (Non-Medical): No  Physical Activity: Sufficiently Active (05/08/2023)   Exercise Vital Sign    Days of Exercise per Week: 5 days    Minutes of Exercise per Session: 60 min  Stress: No Stress Concern Present (05/08/2023)   [employer] of Occupational Health - Occupational Stress Questionnaire    Feeling of Stress : Only a little  Recent Concern: Stress - Stress Concern Present (04/09/2023)   [employer] of Occupational Health - Occupational Stress Questionnaire    Feeling of Stress  : To some extent  Social Connections: Socially Isolated (05/08/2023)   Social Connection and Isolation Panel [NHANES]    Frequency of Communication with Friends and Family: Once a week    Frequency of Social Gatherings with Friends and Family: Once a week    Attends Religious Services: Never    Database Administrator or Organizations: No    Attends Engineer, Structural: Not on file    Marital Status: Married  Intimate Partner Violence: Not on file    Observations/Objective: Vitals:   05/11/23 0923  BP: 112/72  Pulse: 63  Nontoxic appearance on video.  Speaking full sentences without respiratory distress, euthymic mood.  Does not appear to be responding to internal stimuli.  All questions answered with understanding of plan expressed.  Assessment and Plan: Anxiety - Plan: sertraline  (ZOLOFT ) 50 MG tablet, hydrOXYzine  (ATARAX ) 10 MG tablet  -Improving, would like to try higher dose of sertraline , no side effects on current dose.  Increase to 50 mg, continue hydroxyzine  as needed for breakthrough symptoms, continue counseling, recheck 6 weeks.  Sooner if needed.  Decreased GFR  -Appears to have overall stable creatinine on testing since 2023 with minimal variation.  He is on ACE inhibitor currently.  Has cut back on NSAIDs and trying to increase hydration.  Likely component of CKD with underlying hypertension.  Continue to avoid NSAIDs, maintain hydration, recheck in 6 weeks with repeat testing and option of nephrology eval.  No med changes for now.   Follow Up Instructions: 6 weeks.    I discussed the assessment and treatment plan with the patient. The patient was provided an opportunity to ask questions and all were answered. The patient agreed with the plan  and demonstrated an understanding of the instructions.   The patient was advised to call back or seek an in-person evaluation if the symptoms worsen or if the condition fails to improve as anticipated.   Reyes JONELLE Pines,  MD

## 2023-05-15 ENCOUNTER — Other Ambulatory Visit: Payer: Self-pay | Admitting: Family Medicine

## 2023-05-15 DIAGNOSIS — J309 Allergic rhinitis, unspecified: Secondary | ICD-10-CM

## 2023-05-16 ENCOUNTER — Encounter: Payer: Self-pay | Admitting: Cardiovascular Disease

## 2023-05-18 ENCOUNTER — Encounter: Payer: Self-pay | Admitting: Behavioral Health

## 2023-05-18 ENCOUNTER — Ambulatory Visit: Payer: BLUE CROSS/BLUE SHIELD | Admitting: Behavioral Health

## 2023-05-18 DIAGNOSIS — F411 Generalized anxiety disorder: Secondary | ICD-10-CM

## 2023-05-18 NOTE — Progress Notes (Signed)
[facility] Counselor/Therapist Progress Note  Patient ID: Carless Fabris, MRN: [id number],    Date: 05/18/2023  Time Spent: 60 minutes 9 AM until 10 AM.This session was held via video teletherapy. The patient consented to the video teletherapy and was located in his home office during this session. He is aware it is the responsibility of the patient to secure confidentiality on his end of the session. The provider was in a private home office for the duration of this session.    Treatment Type: Individual Therapy  Reported Symptoms: Anxiety, stress, depression  Mental Status Exam: Appearance:  Well Groomed     Behavior: Appropriate  Motor: Normal  Speech/Language:  Normal Rate  Affect: Appropriate  Mood: normal  Thought process: normal  Thought content:   WNL  Sensory/Perceptual disturbances:   WNL  Orientation: oriented to person, place, time/date, situation, day of week, month of year, and year  Attention: Good  Concentration: Good  Memory: WNL  Fund of knowledge:  Good  Insight:   Good  Judgment:  Good  Impulse Control: Good   Risk Assessment: Danger to Self:  No Self-injurious Behavior: No Danger to Others: No Duty to Warn:no Physical Aggression / Violence:No  Access to Firearms a concern: No  Gang Involvement:No   Subjective: For the most part the patient had a good holiday and he was able to spend some time with family.  He feels like he has an adequate plan for when he does have to have his heart surgery and has taken some of that stress off of his plate.  His wife is now able to walk fairly well after knee replacement surgery and is thankful she is feeling better.  There are still some other medical issues but she is capable now of helping out around more the house.  He says that he feels overwhelmed keeping up with things.  For the most part because of medical issues she has not been able to do much outside of her work to help out around the house so  we talked about different strategies to help the patient get her and his daughter engaged in doing more of the things around the house that need to be done.  He recognizes that there been ways that he has approach that did not work well with them so we talked about how he could change his strategy including tone of voice etc. but also to come up with a list of basic every day short time and energy type of jobs so that they could divide them up.  I encouraged him to think through that thoroughly look at what he can do what she can do and what his daughter could do in terms of deciding but also in following up on those responsibilities. He does contract for safety having no thoughts of hurting himself or anyone else.  Interventions: Cognitive Behavioral Therapy and Dialectical Behavioral Therapy Diagnosis:Generalized anxiety disorder  Plan: I will meet with the patient every 2 to 3 weeks. Treatment plan: Goals are to reduce the patient's anxiety and some mild depression primarily related to medical condition but also other family factors by at least 50% with a target date of July 31, 2023.  Goals for minimizing depression are for the patient to have less sadness as indicated by his report and PHQ-9 scores, have improved mood and return to a healthier level of functioning.  We will look at any other causes for depression in addition to those stated above.  We will explore how depression is experienced by him, use cognitive behavioral therapy to explore and replace unhealthy thought and behavior patterns contributing to depression as well as encouraging the sharing of feelings related to causes and symptoms of depression.  We will introduce coping skills for managing depressive symptoms.  Goals for reducing anxiety include improving his ability to better manage stress and anxiety symptoms, identify causes for anxiety and introduce ways to lower it including resolving core conflicts contributing to the anxiety.   Finally a goal for the patient will be to manage thoughts and worrisome thinking contributing to feelings of anxiety.  Interventions include providing education about anxiety to help him understand his causes and triggers, facilitate problem solution skills and coping skills for managing anxiety.  We will also use cognitive behavioral therapy to identify and change anxiety provoking thought and behavior patterns as well as dialectical behavior therapy to introduce distress tolerance and mindfulness skills.  Progress: 25%  [place], [facility]                  French Ana, [facility]               French Ana, [facility]               French Ana, [facility]               French Ana, [facility]  [facility] Counselor/Therapist Progress Note  Patient ID: Jhaylen Tischer, MRN: [id number],    Date: 05/18/2023  Time Spent: 60 minutes, 1 PM until 2 PM .This session was held via video teletherapy. The patient consented to the video teletherapy and was located in his home office during this session. He is aware it is the responsibility of the patient to secure confidentiality on his end of the session. The provider was in a private home office for the duration of this session.    Treatment Type: Individual Therapy  Reported Symptoms: Anxiety, stress, depression  Mental Status Exam: Appearance:  Well Groomed     Behavior: Appropriate  Motor: Normal  Speech/Language:  Normal Rate  Affect: Appropriate  Mood: normal  Thought process: normal  Thought content:   WNL  Sensory/Perceptual disturbances:   WNL  Orientation: oriented to person, place, time/date, situation, day of week, month of year, and year  Attention: Good  Concentration: Good  Memory: WNL  Fund of knowledge:  Good  Insight:   Good  Judgment:  Good  Impulse Control: Good   Risk Assessment: Danger to Self:  No Self-injurious Behavior: No Danger to Others:  No Duty to Warn:no Physical Aggression / Violence:No  Access to Firearms a concern: No  Gang Involvement:No   Subjective: Thanksgiving went fairly well for the patient in relationship to his family gathering.  His wife did have knee replacement surgery and that seemed to go well.  He is that she is progressing.  He is tired because the most part he is doing everything around the house right now.  We looked more deeply at his relationship with his daughter and some of the frustrations that come with that.  He feels that she has made progress but he and his wife had a lot of work and getting things done including her doing simple things such as good hygiene and small chores around the house.  We looked at how he could encourage her but also set boundaries with her.  He said that his anxiety level has been  up in part due to the holidays and having so much to do by himself and he talked to his doctor about it.  He said it does not necessarily appear to be panic but it was definitely elevated anxiety.  Most of the things that he is using coping skills are helping but we talked about some different ways that he might be able to reframe some of the anxious thoughts as well as find some downtime for himself.  To his knowledge he still feels like things are going fairly well.  There are still temptations to look closely at every pain or 8 that he has but he knows the signs to look for. He does contract for safety having no thoughts of hurting himself or anyone else.  Interventions: Cognitive Behavioral Therapy and Dialectical Behavioral Therapy Diagnosis:Generalized anxiety disorder  Plan: I will meet with the patient every 2 to 3 weeks. Treatment plan: Goals are to reduce the patient's anxiety and some mild depression primarily related to medical condition but also other family factors by at least 50% with a target date of July 31, 2023.  Goals for minimizing depression are for the patient to have less  sadness as indicated by his report and PHQ-9 scores, have improved mood and return to a healthier level of functioning.  We will look at any other causes for depression in addition to those stated above.  We will explore how depression is experienced by him, use cognitive behavioral therapy to explore and replace unhealthy thought and behavior patterns contributing to depression as well as encouraging the sharing of feelings related to causes and symptoms of depression.  We will introduce coping skills for managing depressive symptoms.  Goals for reducing anxiety include improving his ability to better manage stress and anxiety symptoms, identify causes for anxiety and introduce ways to lower it including resolving core conflicts contributing to the anxiety.  Finally a goal for the patient will be to manage thoughts and worrisome thinking contributing to feelings of anxiety.  Interventions include providing education about anxiety to help him understand his causes and triggers, facilitate problem solution skills and coping skills for managing anxiety.  We will also use cognitive behavioral therapy to identify and change anxiety provoking thought and behavior patterns as well as dialectical behavior therapy to introduce distress tolerance and mindfulness skills.  Progress: 25%  French Ana, [facility]                  French Ana, [facility]               French Ana, [facility]               French Ana, [facility]               French Ana, [facility]               French Ana, [facility]

## 2023-05-28 ENCOUNTER — Other Ambulatory Visit: Payer: Self-pay | Admitting: Family Medicine

## 2023-05-28 DIAGNOSIS — I700 Atherosclerosis of aorta: Secondary | ICD-10-CM

## 2023-05-28 DIAGNOSIS — E785 Hyperlipidemia, unspecified: Secondary | ICD-10-CM

## 2023-06-01 ENCOUNTER — Ambulatory Visit: Payer: BLUE CROSS/BLUE SHIELD | Admitting: Behavioral Health

## 2023-06-01 ENCOUNTER — Encounter: Payer: Self-pay | Admitting: Behavioral Health

## 2023-06-01 DIAGNOSIS — F411 Generalized anxiety disorder: Secondary | ICD-10-CM

## 2023-06-01 NOTE — Progress Notes (Signed)
[facility] Counselor/Therapist Progress Note  Patient ID: Steven Sloan, MRN: [id number],    Date: 06/01/2023  Time Spent: 58 minutes 1 PM until 1:58 PM.This session was held via video teletherapy. The patient consented to the video teletherapy and was located in his home office during this session. He is aware it is the responsibility of the patient to secure confidentiality on his end of the session. The provider was in a private home office for the duration of this session.    Treatment Type: Individual Therapy  Reported Symptoms: Anxiety, stress, depression  Mental Status Exam: Appearance:  Well Groomed     Behavior: Appropriate  Motor: Normal  Speech/Language:  Normal Rate  Affect: Appropriate  Mood: normal  Thought process: normal  Thought content:   WNL  Sensory/Perceptual disturbances:   WNL  Orientation: oriented to person, place, time/date, situation, day of week, month of year, and year  Attention: Good  Concentration: Good  Memory: WNL  Fund of knowledge:  Good  Insight:   Good  Judgment:  Good  Impulse Control: Good   Risk Assessment: Danger to Self:  No Self-injurious Behavior: No Danger to Others: No Duty to Warn:no Physical Aggression / Violence:No  Access to Firearms a concern: No  Gang Involvement:No   Subjective: The patient has been very busy but has been productive busy and he enjoys his job.  There have not been any significant physical symptoms that he has noticed in relation to his health changes.  He feels he is doing a little better job in terms of not reacting to whenever he does feel something that feels a little different in his body.  He is starting to get a little by and in terms of participation in caring for the house from his wife and his daughter.  His wife will have some other work done which should improve her health but her knee is recuperating well.  We talked about making subtle changes for that buy in.  He does still  have what he describes as some panic attacks and a lot of them appear to be triggered when he gets in a thought loop about hearing the news for the first time that he had a major heart issue.  He says he was sitting on the edge of the examining table when the doctor told him very straightforward what happened and what needed to happen and he said he felt dizzy and had to lay back on the table and was in somewhat of a fog for the rest of the conversation.  The doctor later apologized saying he should not have given him so much information so directly so soon.  We talked about breaking out thoughts cycle not denying the news that he got but instead coping with it physically by using the tips skills as whether skills he is already using.  We also talked about using cognitive pairing, coupling a a positive thought of him overcoming his heart issues with the surgery and we looked at different ways to practice that.  He does contract for safety having no thoughts of hurting himself or anyone else.  Interventions: Cognitive Behavioral Therapy and Dialectical Behavioral Therapy Diagnosis:Generalized anxiety disorder  Plan: I will meet with the patient every 2 to 3 weeks. Treatment plan: Goals are to reduce the patient's anxiety and some mild depression primarily related to medical condition but also other family factors by at least 50% with a target date of July 31, 2023.  Goals for  minimizing depression are for the patient to have less sadness as indicated by his report and PHQ-9 scores, have improved mood and return to a healthier level of functioning.  We will look at any other causes for depression in addition to those stated above.  We will explore how depression is experienced by him, use cognitive behavioral therapy to explore and replace unhealthy thought and behavior patterns contributing to depression as well as encouraging the sharing of feelings related to causes and symptoms of depression.  We will  introduce coping skills for managing depressive symptoms.  Goals for reducing anxiety include improving his ability to better manage stress and anxiety symptoms, identify causes for anxiety and introduce ways to lower it including resolving core conflicts contributing to the anxiety.  Finally a goal for the patient will be to manage thoughts and worrisome thinking contributing to feelings of anxiety.  Interventions include providing education about anxiety to help him understand his causes and triggers, facilitate problem solution skills and coping skills for managing anxiety.  We will also use cognitive behavioral therapy to identify and change anxiety provoking thought and behavior patterns as well as dialectical behavior therapy to introduce distress tolerance and mindfulness skills.  Progress: 25%  [place], [facility]                  French Ana, [facility]               French Ana, [facility]               French Ana, [facility]               French Ana, [facility]   Behavioral Health Counselor/Therapist Progress Note  Patient ID: Steven Sloan, MRN: [id number],    Date: 06/01/2023  Time Spent: 60 minutes, 1 PM until 2 PM .This session was held via video teletherapy. The patient consented to the video teletherapy and was located in his home office during this session. He is aware it is the responsibility of the patient to secure confidentiality on his end of the session. The provider was in a private home office for the duration of this session.    Treatment Type: Individual Therapy  Reported Symptoms: Anxiety, stress, depression  Mental Status Exam: Appearance:  Well Groomed     Behavior: Appropriate  Motor: Normal  Speech/Language:  Normal Rate  Affect: Appropriate  Mood: normal  Thought process: normal  Thought content:   WNL  Sensory/Perceptual disturbances:   WNL  Orientation: oriented to person, place,  time/date, situation, day of week, month of year, and year  Attention: Good  Concentration: Good  Memory: WNL  Fund of knowledge:  Good  Insight:   Good  Judgment:  Good  Impulse Control: Good   Risk Assessment: Danger to Self:  No Self-injurious Behavior: No Danger to Others: No Duty to Warn:no Physical Aggression / Violence:No  Access to Firearms a concern: No  Gang Involvement:No   Subjective: Thanksgiving went fairly well for the patient in relationship to his family gathering.  His wife did have knee replacement surgery and that seemed to go well.  He is that she is progressing.  He is tired because the most part he is doing everything around the house right now.  We looked more deeply at his relationship with his daughter and some of the frustrations that come with that.  He feels that she has made progress but he and his wife had a  lot of work and getting things done including her doing simple things such as good hygiene and small chores around the house.  We looked at how he could encourage her but also set boundaries with her.  He said that his anxiety level has been up in part due to the holidays and having so much to do by himself and he talked to his doctor about it.  He said it does not necessarily appear to be panic but it was definitely elevated anxiety.  Most of the things that he is using coping skills are helping but we talked about some different ways that he might be able to reframe some of the anxious thoughts as well as find some downtime for himself.  To his knowledge he still feels like things are going fairly well.  There are still temptations to look closely at every pain or 8 that he has but he knows the signs to look for. He does contract for safety having no thoughts of hurting himself or anyone else.  Interventions: Cognitive Behavioral Therapy and Dialectical Behavioral Therapy Diagnosis:Generalized anxiety disorder  Plan: I will meet with the patient every 2  to 3 weeks. Treatment plan: Goals are to reduce the patient's anxiety and some mild depression primarily related to medical condition but also other family factors by at least 50% with a target date of July 31, 2023.  Goals for minimizing depression are for the patient to have less sadness as indicated by his report and PHQ-9 scores, have improved mood and return to a healthier level of functioning.  We will look at any other causes for depression in addition to those stated above.  We will explore how depression is experienced by him, use cognitive behavioral therapy to explore and replace unhealthy thought and behavior patterns contributing to depression as well as encouraging the sharing of feelings related to causes and symptoms of depression.  We will introduce coping skills for managing depressive symptoms.  Goals for reducing anxiety include improving his ability to better manage stress and anxiety symptoms, identify causes for anxiety and introduce ways to lower it including resolving core conflicts contributing to the anxiety.  Finally a goal for the patient will be to manage thoughts and worrisome thinking contributing to feelings of anxiety.  Interventions include providing education about anxiety to help him understand his causes and triggers, facilitate problem solution skills and coping skills for managing anxiety.  We will also use cognitive behavioral therapy to identify and change anxiety provoking thought and behavior patterns as well as dialectical behavior therapy to introduce distress tolerance and mindfulness skills.  Progress: 25%  French Ana, [facility]                  French Ana, [facility]               French Ana, [facility]               French Ana, [facility]               French Ana, [facility]               French Ana, [facility]               French Ana, [facility]

## 2023-06-02 ENCOUNTER — Other Ambulatory Visit: Payer: Self-pay | Admitting: Family Medicine

## 2023-06-02 DIAGNOSIS — F419 Anxiety disorder, unspecified: Secondary | ICD-10-CM

## 2023-06-07 ENCOUNTER — Ambulatory Visit (INDEPENDENT_AMBULATORY_CARE_PROVIDER_SITE_OTHER): Payer: BLUE CROSS/BLUE SHIELD | Admitting: Internal Medicine

## 2023-06-07 ENCOUNTER — Encounter (INDEPENDENT_AMBULATORY_CARE_PROVIDER_SITE_OTHER): Payer: Self-pay | Admitting: Internal Medicine

## 2023-06-07 VITALS — BP 120/75 | HR 66 | Temp 97.9°F | Ht 69.0 in | Wt 241.0 lb

## 2023-06-07 DIAGNOSIS — G4733 Obstructive sleep apnea (adult) (pediatric): Secondary | ICD-10-CM

## 2023-06-07 DIAGNOSIS — R944 Abnormal results of kidney function studies: Secondary | ICD-10-CM

## 2023-06-07 DIAGNOSIS — E66812 Obesity, class 2: Secondary | ICD-10-CM

## 2023-06-07 DIAGNOSIS — R7303 Prediabetes: Secondary | ICD-10-CM

## 2023-06-07 DIAGNOSIS — Z6836 Body mass index (BMI) 36.0-36.9, adult: Secondary | ICD-10-CM

## 2023-06-07 MED ORDER — ZEPBOUND 2.5 MG/0.5ML ~~LOC~~ SOAJ: 2.5000 mg | SUBCUTANEOUS | 0 refills | Status: DC

## 2023-06-07 NOTE — Assessment & Plan Note (Signed)
 Severe OSA per history.  On CPAP with reported good compliance. Continue PAP therapy.  Losing 15% of body weight may reduce AHI.  We also reviewed that Zepbound  is now approved for weight loss in patients with moderate to severe obstructive sleep apnea.  Considering number of obesity related comorbid conditions I think he would benefit from GLP-1 therapy.  After discussion of benefits and side effect she will be started on Zepbound  2.5 mg once a week

## 2023-06-07 NOTE — Assessment & Plan Note (Signed)
 See obesity treatment plan

## 2023-06-07 NOTE — Assessment & Plan Note (Signed)
 Patient has discussed decreased GFR with primary care team.  They will continue monitoring.  He will continue on lisinopril  blood pressure is well-controlled.  He had been seen by a nephrologist in the past.  Advised on maintaining adequate hydration and avoiding nephrotoxins.

## 2023-06-07 NOTE — Progress Notes (Signed)
 Office: [phone]  /  Fax: [phone]  Weight Summary And Biometrics  Vitals Temp: 97.9 F (36.6 C) BP: 120/75 Pulse Rate: 66 SpO2: 99 %   Anthropometric Measurements Height: 5' 9 (1.753 m) Weight: 241 lb (109.3 kg) BMI (Calculated): 35.57 Weight at Last Visit: 244 lb Weight Lost Since Last Visit: 3 lb Weight Gained Since Last Visit: 0 lb Starting Weight: 247 lb Total Weight Loss (lbs): 6 lb (2.722 kg) Peak Weight: 285 lb   Body Composition  Body Fat %: 29.3 % Fat Mass (lbs): 70.6 lbs Muscle Mass (lbs): 162.2 lbs Total Body Water (lbs): 114.6 lbs Visceral Fat Rating : 18    No data recorded Today's Visit #: 4  Starting Date: 03/21/23   Subjective   Chief Complaint: Obesity  Steven Sloan is here to discuss his progress with his obesity treatment plan. He is on the the Category 4 Plan and states he is following his eating plan approximately 60-70 % of the time. He states he is exercising 60 minutes 5 times per week.  Weight Progress Since Last Visit:  Since last office visit he has lost 3 pounds. He reports good adherence to reduced calorie nutritional plan. He has been working on reading food labels, not skipping meals, increasing protein intake at every meal, drinking more water, making healthier choices, reducing portion sizes, and incorporating more whole foods   Challenges affecting patient progress: none.   Orexigenic Control: Reports problems with appetite and hunger signals.  Reports problems with satiety and satiation.  Denies problems with eating patterns and portion control.  Denies abnormal cravings. Denies feeling deprived or restricted.   Pharmacotherapy for weight management: He is currently taking no anti-obesity medication.   Assessment and Plan   Treatment Plan For Obesity:  Recommended Dietary Goals  Celeste is currently in the action stage of change. As such, his goal is to continue weight management plan. He has agreed to:  continue current plan  Behavioral Health and Counseling  We discussed the following behavioral modification strategies today: continue to work on maintaining a reduced calorie state, getting the recommended amount of protein, incorporating whole foods, making healthy choices, staying well hydrated and practicing mindfulness when eating..  Additional education and resources provided today: Handout guide to GLP-1 therapy  Recommended Physical Activity Goals  Jaecob has been advised to work up to 150 minutes of moderate intensity aerobic activity a week and strengthening exercises 2-3 times per week for cardiovascular health, weight loss maintenance and preservation of muscle mass.   He has agreed to :  continue to gradually increase the amount and intensity of exercise routine  Pharmacotherapy  We discussed various medication options to help Duron with his weight loss efforts and we both agreed to : start anti-obesity medication.  In addition to reduced calorie nutrition plan (RCNP), behavioral strategies and physical activity, Salman would benefit from pharmacotherapy to assist with hunger signals, satiety and cravings. This will reduce obesity-related health risks by inducing weight loss, and help reduce food consumption and adherence to [facility]) . It may also improve QOL by improving self-confidence and reduce the  setbacks associated with metabolic adaptations.  Patient also has high risk medical conditions affected by his weight these include moderate to severe OSA, hypertension, prediabetes and possibly chronic kidney disease based on GFR.  After discussion of treatment options, mechanisms of action, benefits, side effects, contraindications and shared decision making he is agreeable to starting Zepbound  2.5 mg once a week. Patient also made aware that  medication is indicated for long-term management of obesity and the risk of weight regain following discontinuation of treatment and hence the  importance of adhering to medical weight loss plan.  We demonstrated use of device and patient using teach back method was able to demonstrate proper technique.  Associated Conditions Impacted by Obesity Treatment  OSA (obstructive sleep apnea) Assessment & Plan: Severe OSA per history.  On CPAP with reported good compliance. Continue PAP therapy.  Losing 15% of body weight may reduce AHI.  We also reviewed that Zepbound  is now approved for weight loss in patients with moderate to severe obstructive sleep apnea.  Considering number of obesity related comorbid conditions I think he would benefit from GLP-1 therapy.  After discussion of benefits and side effect she will be started on Zepbound  2.5 mg once a week   Orders: -     Zepbound ; Inject 2.5 mg into the skin once a week.  Dispense: 2 mL; Refill: 0  Class 2 severe obesity with serious comorbidity and body mass index (BMI) of 36.0 to 36.9 in adult, unspecified obesity type [facility]) Assessment & Plan: See obesity treatment plan   Orders: -     Zepbound ; Inject 2.5 mg into the skin once a week.  Dispense: 2 mL; Refill: 0  Prediabetes Assessment & Plan: He has had 2 consecutive fasting blood sugars above 100 with normal range hemoglobin A1c and mildly elevated insulin  levels consistent with mild insulin  resistance.  Patient counseled on disease state and risk of progression as well as complications.  He has been educated on the carb insulin  model of obesity.  He will continue to work on reducing simple and added sugars in his diet and weight loss.  He will be started on Zepbound  2.5 mg once a week for pharmacoprophylaxis and management of his obesity and related conditions.  Orders: -     Zepbound ; Inject 2.5 mg into the skin once a week.  Dispense: 2 mL; Refill: 0  Decreased GFR Assessment & Plan: Patient has discussed decreased GFR with primary care team.  They will continue monitoring.  He will continue on lisinopril  blood pressure is  well-controlled.  He had been seen by a nephrologist in the past.  Advised on maintaining adequate hydration and avoiding nephrotoxins.      Objective   Physical Exam:  Blood pressure 120/75, pulse 66, temperature 97.9 F (36.6 C), height 5' 9 (1.753 m), weight 241 lb (109.3 kg), SpO2 99%. Body mass index is 35.59 kg/m.  General: He is overweight, cooperative, alert, well developed, and in no acute distress. PSYCH: Has normal mood, affect and thought process.   HEENT: EOMI, sclerae are anicteric. Lungs: Normal breathing effort, no conversational dyspnea. Extremities: No edema.  Neurologic: No gross sensory or motor deficits. No tremors or fasciculations noted.    Diagnostic Data Reviewed:  BMET    Component Value Date/Time   NA 139 04/10/2023 1034   K 4.1 04/10/2023 1034   CL 103 04/10/2023 1034   CO2 25 04/10/2023 1034   GLUCOSE 100 (H) 04/10/2023 1034   BUN 28 (H) 04/10/2023 1034   CREATININE 1.34 04/10/2023 1034   CALCIUM  10.1 04/10/2023 1034   GFRNONAA >60 04/09/2015 1140   GFRAA >60 04/09/2015 1140   Lab Results  Component Value Date   HGBA1C 5.3 03/21/2023   HGBA1C 5.0 07/22/2015   Lab Results  Component Value Date   INSULIN  12.6 03/21/2023   Lab Results  Component Value Date   TSH 0.78  04/10/2023   CBC    Component Value Date/Time   WBC 6.4 04/10/2023 1034   RBC 5.11 04/10/2023 1034   HGB 15.9 04/10/2023 1034   HCT 46.9 04/10/2023 1034   PLT 224.0 04/10/2023 1034   MCV 91.8 04/10/2023 1034   MCH 31.8 04/09/2015 1140   MCHC 34.0 04/10/2023 1034   RDW 12.9 04/10/2023 1034   Iron Studies No results found for: IRON, TIBC, FERRITIN, IRONPCTSAT Lipid Panel     Component Value Date/Time   CHOL 115 04/10/2023 1034   TRIG 78.0 04/10/2023 1034   HDL 45.40 04/10/2023 1034   CHOLHDL 3 04/10/2023 1034   VLDL 15.6 04/10/2023 1034   LDLCALC 54 04/10/2023 1034   Hepatic Function Panel     Component Value Date/Time   PROT 7.9 04/10/2023  1034   ALBUMIN 4.8 04/10/2023 1034   AST 23 04/10/2023 1034   ALT 24 04/10/2023 1034   ALKPHOS 51 04/10/2023 1034   BILITOT 1.4 (H) 04/10/2023 1034   BILIDIR 0.2 12/07/2022 0933      Component Value Date/Time   TSH 0.78 04/10/2023 1034   Nutritional Lab Results  Component Value Date   VD25OH 40.9 03/21/2023    Follow-Up   Return in about 4 weeks (around 07/05/2023) for For Weight Mangement with Dr. Francyne.SABRA He was informed of the importance of frequent follow up visits to maximize his success with intensive lifestyle modifications for his multiple health conditions.  Attestation Statement   Reviewed by clinician on day of visit: allergies, medications, problem list, medical history, surgical history, family history, social history, and previous encounter notes.     Lucas Francyne, MD

## 2023-06-07 NOTE — Assessment & Plan Note (Signed)
 He has had 2 consecutive fasting blood sugars above 100 with normal range hemoglobin A1c and mildly elevated insulin  levels consistent with mild insulin  resistance.  Patient counseled on disease state and risk of progression as well as complications.  He has been educated on the carb insulin  model of obesity.  He will continue to work on reducing simple and added sugars in his diet and weight loss.  He will be started on Zepbound  2.5 mg once a week for pharmacoprophylaxis and management of his obesity and related conditions.

## 2023-06-14 ENCOUNTER — Encounter (INDEPENDENT_AMBULATORY_CARE_PROVIDER_SITE_OTHER): Payer: Self-pay | Admitting: Internal Medicine

## 2023-06-16 ENCOUNTER — Encounter: Payer: Self-pay | Admitting: Behavioral Health

## 2023-06-16 ENCOUNTER — Ambulatory Visit: Payer: BLUE CROSS/BLUE SHIELD | Admitting: Behavioral Health

## 2023-06-16 DIAGNOSIS — F411 Generalized anxiety disorder: Secondary | ICD-10-CM

## 2023-06-16 DIAGNOSIS — F331 Major depressive disorder, recurrent, moderate: Secondary | ICD-10-CM

## 2023-06-16 NOTE — Progress Notes (Signed)
[facility] Counselor/Therapist Progress Note  Patient ID: Jawon Dipiero, MRN: [id number],    Date: 06/16/2023  Time Spent: 58 minutes 1 PM until 1:58 PM.This session was held via video teletherapy. The patient consented to the video teletherapy and was located in his home office during this session. He is aware it is the responsibility of the patient to secure confidentiality on his end of the session. The provider was in a private home office for the duration of this session.    Treatment Type: Individual Therapy  Reported Symptoms: Anxiety, stress, depression  Mental Status Exam: Appearance:  Well Groomed     Behavior: Appropriate  Motor: Normal  Speech/Language:  Normal Rate  Affect: Appropriate  Mood: normal  Thought process: normal  Thought content:   WNL  Sensory/Perceptual disturbances:   WNL  Orientation: oriented to person, place, time/date, situation, day of week, month of year, and year  Attention: Good  Concentration: Good  Memory: WNL  Fund of knowledge:  Good  Insight:   Good  Judgment:  Good  Impulse Control: Good   Risk Assessment: Danger to Self:  No Self-injurious Behavior: No Danger to Others: No Duty to Warn:no Physical Aggression / Violence:No  Access to Firearms a concern: No  Gang Involvement:No   Subjective: The patient has been very busy but has been productive busy and he enjoys his job.  There have not been any significant physical symptoms that he has noticed in relation to his health changes.  He feels he is doing a little better job in terms of not reacting to whenever he does feel something that feels a little different in his body.  He is starting to get a little by and in terms of participation in caring for the house from his wife and his daughter.  His wife will have some other work done which should improve her health but her knee is recuperating well.  We talked about making subtle changes for that buy in.  He does still  have what he describes as some panic attacks and a lot of them appear to be triggered when he gets in a thought loop about hearing the news for the first time that he had a major heart issue.  He says he was sitting on the edge of the examining table when the doctor told him very straightforward what happened and what needed to happen and he said he felt dizzy and had to lay back on the table and was in somewhat of a fog for the rest of the conversation.  The doctor later apologized saying he should not have given him so much information so directly so soon.  We talked about breaking out thoughts cycle not denying the news that he got but instead coping with it physically by using the tips skills as whether skills he is already using.  We also talked about using cognitive pairing, coupling a positive thought of him overcoming his heart issues with the surgery and we looked at different ways to practice that.  He does contract for safety having no thoughts of hurting himself or anyone else.  Interventions: Cognitive Behavioral Therapy and Dialectical Behavioral Therapy Diagnosis:Generalized anxiety disorder  Plan: I will meet with the patient every 2 to 3 weeks. Treatment plan: Goals are to reduce the patient's anxiety and some mild depression primarily related to medical condition but also other family factors by at least 50% with a target date of July 31, 2023.  Goals for minimizing  depression are for the patient to have less sadness as indicated by his report and PHQ-9 scores, have improved mood and return to a healthier level of functioning.  We will look at any other causes for depression in addition to those stated above.  We will explore how depression is experienced by him, use cognitive behavioral therapy to explore and replace unhealthy thought and behavior patterns contributing to depression as well as encouraging the sharing of feelings related to causes and symptoms of depression.  We will  introduce coping skills for managing depressive symptoms.  Goals for reducing anxiety include improving his ability to better manage stress and anxiety symptoms, identify causes for anxiety and introduce ways to lower it including resolving core conflicts contributing to the anxiety.  Finally a goal for the patient will be to manage thoughts and worrisome thinking contributing to feelings of anxiety.  Interventions include providing education about anxiety to help him understand his causes and triggers, facilitate problem solution skills and coping skills for managing anxiety.  We will also use cognitive behavioral therapy to identify and change anxiety provoking thought and behavior patterns as well as dialectical behavior therapy to introduce distress tolerance and mindfulness skills.  Progress: 25%  French Ana, [facility]                  French Ana, [facility]               French Ana, [facility]               French Ana, [facility]               French Ana, [facility]  [facility] Counselor/Therapist Progress Note  Patient ID: Nesta Kimple, MRN: [id number],    Date: 06/16/2023  Time Spent: 60 minutes, 11 AM until 12 PM.This session was held via video teletherapy. The patient consented to the video teletherapy and was located in his home office during this session. He is aware it is the responsibility of the patient to secure confidentiality on his end of the session. The provider was in a private home office for the duration of this session.    Treatment Type: Individual Therapy  Reported Symptoms: Anxiety, stress, depression  Mental Status Exam: Appearance:  Well Groomed     Behavior: Appropriate  Motor: Normal  Speech/Language:  Normal Rate  Affect: Appropriate  Mood: normal  Thought process: normal  Thought content:   WNL  Sensory/Perceptual disturbances:   WNL  Orientation: oriented to person, place,  time/date, situation, day of week, month of year, and year  Attention: Good  Concentration: Good  Memory: WNL  Fund of knowledge:  Good  Insight:   Good  Judgment:  Good  Impulse Control: Good   Risk Assessment: Danger to Self:  No Self-injurious Behavior: No Danger to Others: No Duty to Warn:no Physical Aggression / Violence:No  Access to Firearms a concern: No  Gang Involvement:No   Subjective: It has been a busy couple of weeks for the patient.  There are been several things he said to do for his daughter.  He is meeting with the school today for an IEP meeting which is always important for her.  Work has been fairly busy.  His wife is doing better physically but she continues radiation for now.  He was prescribed Septra bound by his doctor to aid in his weight loss prior to his heart surgery but he said financially it is too  restrictive and he has to have that conversation with his medical doctor.  He is walking consistently and seeing some weight reduction but hoped that would be a booster for him.  We looked at things that he can do cognitively and physically to help with what they describe as "food noise" in his brain.  We looked at things such as not keeping food items around the house that are tempting for him but also how he can use cognitive reframing and challenging to help reduce the temptation to eat things that he knows he does not need to eat.  Not validated the work that he is putting in at all levels.  He is working on the cognitive pairing that we talked about with the negative thoughts that run through his head in relation to his physical health.  He is still feeling okay and is not having any symptoms that he thinks would contribute to the beginning of getting ready for the heart surgery.  He does contract for safety having no thoughts of hurting himself or anyone else.  Interventions: Cognitive Behavioral Therapy and Dialectical Behavioral Therapy Diagnosis:Generalized  anxiety disorder  Plan: I will meet with the patient every 2 to 3 weeks. Treatment plan: Goals are to reduce the patient's anxiety and some mild depression primarily related to medical condition but also other family factors by at least 50% with a target date of July 31, 2023.  Goals for minimizing depression are for the patient to have less sadness as indicated by his report and PHQ-9 scores, have improved mood and return to a healthier level of functioning.  We will look at any other causes for depression in addition to those stated above.  We will explore how depression is experienced by him, use cognitive behavioral therapy to explore and replace unhealthy thought and behavior patterns contributing to depression as well as encouraging the sharing of feelings related to causes and symptoms of depression.  We will introduce coping skills for managing depressive symptoms.  Goals for reducing anxiety include improving his ability to better manage stress and anxiety symptoms, identify causes for anxiety and introduce ways to lower it including resolving core conflicts contributing to the anxiety.  Finally a goal for the patient will be to manage thoughts and worrisome thinking contributing to feelings of anxiety.  Interventions include providing education about anxiety to help him understand his causes and triggers, facilitate problem solution skills and coping skills for managing anxiety.  We will also use cognitive behavioral therapy to identify and change anxiety provoking thought and behavior patterns as well as dialectical behavior therapy to introduce distress tolerance and mindfulness skills.  Progress: 25%  French Ana, [facility]                  French Ana, [facility]               French Ana, [facility]               [place] Ana, [facility]               [place], [facility]               [place],  [facility]               [place], [facility]               [place], [facility]

## 2023-06-23 ENCOUNTER — Ambulatory Visit (INDEPENDENT_AMBULATORY_CARE_PROVIDER_SITE_OTHER): Payer: BLUE CROSS/BLUE SHIELD | Admitting: Family Medicine

## 2023-06-23 ENCOUNTER — Encounter: Payer: Self-pay | Admitting: Family Medicine

## 2023-06-23 VITALS — BP 120/68 | HR 67 | Temp 98.4°F | Ht 69.0 in | Wt 245.2 lb

## 2023-06-23 DIAGNOSIS — F419 Anxiety disorder, unspecified: Secondary | ICD-10-CM | POA: Diagnosis not present

## 2023-06-23 DIAGNOSIS — Z5181 Encounter for therapeutic drug level monitoring: Secondary | ICD-10-CM | POA: Diagnosis not present

## 2023-06-23 DIAGNOSIS — R944 Abnormal results of kidney function studies: Secondary | ICD-10-CM | POA: Diagnosis not present

## 2023-06-23 DIAGNOSIS — B351 Tinea unguium: Secondary | ICD-10-CM

## 2023-06-23 LAB — COMPREHENSIVE METABOLIC PANEL
ALT: 27 U/L (ref 0–53)
AST: 22 U/L (ref 0–37)
Albumin: 4.6 g/dL (ref 3.5–5.2)
Alkaline Phosphatase: 58 U/L (ref 39–117)
BUN: 25 mg/dL — ABNORMAL HIGH (ref 6–23)
CO2: 29 meq/L (ref 19–32)
Calcium: 9.7 mg/dL (ref 8.4–10.5)
Chloride: 102 meq/L (ref 96–112)
Creatinine, Ser: 1.44 mg/dL (ref 0.40–1.50)
GFR: 52.89 mL/min — ABNORMAL LOW (ref >60.00)
Glucose, Bld: 99 mg/dL (ref 70–99)
Potassium: 4 meq/L (ref 3.5–5.1)
Sodium: 136 meq/L (ref 135–145)
Total Bilirubin: 0.8 mg/dL (ref 0.2–1.2)
Total Protein: 7.9 g/dL (ref 6.0–8.3)

## 2023-06-23 MED ORDER — SERTRALINE HCL 100 MG PO TABS: 100.0000 mg | ORAL_TABLET | Freq: Every day | ORAL | 3 refills | Status: DC

## 2023-06-23 NOTE — Patient Instructions (Addendum)
I think it is reasonable to try the higher dose of sertraline, 100 mg/day.  If any new side effects on that dose let me know.  Hydroxyzine if needed for breakthrough symptoms.  I will check labs today including kidney function test but that was improved on your most recent testing.  Unfortunately without much change on the medications for toenail fungus it may be worthwhile meeting with podiatry to get their opinion.  I would finish the current dose of Lamisil and not start any new medicines at this time until meeting with podiatry.  Let me know if you do not receive a phone call from their office in the next few weeks.  No other med changes at this time.  If medications are working well, I can see you in 4 months, but happy to meet with you sooner if needed.  Take care!

## 2023-06-23 NOTE — Progress Notes (Signed)
Subjective:  Patient ID: Steven Sloan, male    DOB: Feb 26, 1961  Age: 63 y.o. MRN: [id number]  CC:  Chief Complaint  Patient presents with   Nail Problem   Anxiety    Pt notes doing okay would like to increase cirtraline, notes still takes one Hydroxyzine in am with Cirtraline,     HPI [name] presents for   Anxiety Last visit January 9.  Improving at that time, tolerating sertraline but dosage was increased.  Hydroxyzine if needed throughout the day for breakthrough anxiety.  We increased sertraline to 50 mg daily.  Still taking hydroxyzine once in the morning and would like to try higher dose of sertraline again.  Has been meeting with therapist, working on mindfulness and other ways to manage stressors. No side effects with sertraline and taking with food.     06/23/2023    8:34 AM 05/11/2023    9:24 AM 04/10/2023    9:42 AM 12/07/2022    8:35 AM  GAD 7 : Generalized Anxiety Score  Nervous, Anxious, on Edge 1 1 2 2   Control/stop worrying 0 0 2 2  Worry too much - different things 0 1 2 2   Trouble relaxing 0 1 1 0  Restless 0 0 0 0  Easily annoyed or irritable 1 0 1 2  Afraid - awful might happen 0 1 2 2   Total GAD 7 Score 2 4 10 10       06/23/2023    8:34 AM 05/11/2023    9:24 AM 04/10/2023    9:42 AM 02/20/2023    8:35 AM 12/07/2022    8:35 AM  Depression screen PHQ 2/9  Decreased Interest 0 0 0 0 0  Down, Depressed, Hopeless 1 1 1 1 1   PHQ - 2 Score 1 1 1 1 1   Altered sleeping 0 0 1  0  Tired, decreased energy 0  0  0  Change in appetite 1 0 0  1  Feeling bad or failure about yourself  0 0 0  1  Trouble concentrating 0 0 0  1  Moving slowly or fidgety/restless 0 0 0  0  Suicidal thoughts 0 0 0  0  PHQ-9 Score 2 1 2  4       Elevated creatinine Noted on previous labs, with possible component of chronic kidney disease.  Plan for recheck today, option of meeting with nephrologist. he is on ACE inhibitor for hypertension.  eGFR 57 with creatinine 1.34 in  December, improved from 1.48 prior.  Avoidance of NSAIDs have been discussed.  Lab Results  Component Value Date   CREATININE 1.34 04/10/2023    Onychomycosis: Has been treated with lamisil and prior ciclopirox - off past month - not seeming to be effective. Has been on treatment for years with minimal progress. Still on lamisil daily. R foot ok, primarily left foot.   Lab Results  Component Value Date   CHOL 115 04/10/2023   HDL 45.40 04/10/2023   LDLCALC 54 04/10/2023   TRIG 78.0 04/10/2023   CHOLHDL 3 04/10/2023      History Patient Active Problem List   Diagnosis Date Noted   History of colonic polyps 04/14/2023   At increased risk for cardiovascular disease 04/04/2023   Prediabetes 04/04/2023   Decreased GFR 03/21/2023   Hyperlipidemia 03/21/2023   Hx of adenomatous colonic polyps 03/20/2023   Severe aortic stenosis 03/20/2023   Emphysema lung (HCC) 01/13/2022   Systolic murmur 01/11/2019   Class 2  severe obesity with serious comorbidity and body mass index (BMI) of 36.0 to 36.9 in adult [facility]) 08/13/2018   Prostate cancer screening 06/07/2017   Internal hemorrhoid 06/07/2017   Visit for preventive health examination 07/26/2015   Quit smoking 07/01/2015   OSA (obstructive sleep apnea) 06/10/2015   Essential hypertension, benign 06/10/2015   Past Medical History:  Diagnosis Date   Allergy    Seasonal   Anxiety    Aortic valve stenosis 08/10/2022   severe   Chicken pox    Heart murmur    Asymptomatic -- Patient endorses previous negative evaluation   High cholesterol    Hypertension    Sleep apnea    CPAP nightly   Past Surgical History:  Procedure Laterality Date   COLONOSCOPY     COLONOSCOPY WITH PROPOFOL N/A 04/14/2023   Procedure: COLONOSCOPY WITH PROPOFOL;  Surgeon: Lemar Lofty., MD;  Location: [facility] ENDOSCOPY;  Service: Gastroenterology;  Laterality: N/A;   POLYPECTOMY  04/14/2023   Procedure: POLYPECTOMY;  Surgeon: Meridee Score Netty Starring.,  MD;  Location: [facility] ENDOSCOPY;  Service: Gastroenterology;;   WISDOM TOOTH EXTRACTION     No Known Allergies Prior to Admission medications   Medication Sig Start Date End Date Taking? Authorizing Provider  aspirin EC 81 MG tablet Take 81 mg by mouth daily.   Yes [provider]  atorvastatin (LIPITOR) 10 MG tablet TAKE 1 TABLET BY MOUTH EVERY DAY 05/29/23  Yes Shade Flood, MD  Azelastine HCl 137 MCG/SPRAY SOLN INSTILL 1-2 SPRAYS INTO BOTH NOSTRILS 2 TIMES DAILY AS DIRECTED 04/03/23  Yes Shade Flood, MD  chlorhexidine (PERIDEX) 0.12 % solution  02/06/23  Yes [provider]  Ciclopirox 0.77 % gel APPLY TO AFFECTED AREA TWICE A DAY 12/08/22  Yes Shade Flood, MD  fluticasone [facility]) 50 MCG/ACT nasal spray USE 2 SPRAYS IN BOTH NOSTRILS  DAILY 05/15/23  Yes Shade Flood, MD  hydrOXYzine (ATARAX) 10 MG tablet Take 1 tablet (10 mg total) by mouth 3 (three) times daily as needed for anxiety. 05/11/23  Yes Shade Flood, MD  lisinopril (ZESTRIL) 10 MG tablet TAKE 1 TABLET BY MOUTH DAILY 09/16/22  Yes Shade Flood, MD  psyllium (REGULOID) 0.52 g capsule Take 0.52 g by mouth daily.   Yes [provider]  sertraline (ZOLOFT) 50 MG tablet Take 1 tablet (50 mg total) by mouth daily. 05/11/23  Yes Shade Flood, MD  terazosin (HYTRIN) 1 MG capsule TAKE 1 CAPSULE BY MOUTH AT  BEDTIME 09/16/22  Yes Shade Flood, MD  terbinafine (LAMISIL) 250 MG tablet TAKE 1 TABLET BY MOUTH EVERY DAY 05/05/23  Yes Shade Flood, MD  tirzepatide [facility]) 2.5 MG/0.5ML Pen Inject 2.5 mg into the skin once a week. 06/07/23  Yes Worthy Rancher, MD   Social History   Socioeconomic History   Marital status: Married    Spouse name: Baxter Hire   Number of children: Not on file   Years of education: Not on file   Highest education level: Master's degree (e.g., MA, MS, MEng, MEd, MSW, MBA)  Occupational History   Not on file  Tobacco Use   Smoking status: Former     Current packs/day: 0.00    Average packs/day: 0.3 packs/day for 25.0 years (6.3 ttl pk-yrs)    Types: Cigarettes    Start date: 05/17/1990    Quit date: 05/18/2015    Years since quitting: 8.1   Smokeless tobacco: Never  Vaping Use   Vaping status:  Never Used  Substance and Sexual Activity   Alcohol use: Yes    Alcohol/week: 2.0 - 4.0 standard drinks of alcohol    Types: 2 - 4 Standard drinks or equivalent per week    Comment: social   Drug use: Not Currently    Types: Marijuana   Sexual activity: Yes  Other Topics Concern   Not on file  Social History Narrative   Not on file   Social Drivers of Health   Financial Resource Strain: Low Risk  (05/08/2023)   Overall Financial Resource Strain (CARDIA)    Difficulty of Paying Living Expenses: Not hard at all  Food Insecurity: No Food Insecurity (05/08/2023)   Hunger Vital Sign    Worried About Running Out of Food in the Last Year: Never true    Ran Out of Food in the Last Year: Never true  Transportation Needs: No Transportation Needs (05/08/2023)   PRAPARE - Administrator, Civil Service (Medical): No    Lack of Transportation (Non-Medical): No  Physical Activity: Sufficiently Active (05/08/2023)   Exercise Vital Sign    Days of Exercise per Week: 5 days    Minutes of Exercise per Session: 60 min  Stress: No Stress Concern Present (05/08/2023)   [employer] of Occupational Health - Occupational Stress Questionnaire    Feeling of Stress : Only a little  Recent Concern: Stress - Stress Concern Present (04/09/2023)   [employer] of Occupational Health - Occupational Stress Questionnaire    Feeling of Stress : To some extent  Social Connections: Socially Isolated (05/08/2023)   Social Connection and Isolation Panel [NHANES]    Frequency of Communication with Friends and Family: Once a week    Frequency of Social Gatherings with Friends and Family: Once a week    Attends Religious Services: Never    Doctor, general practice or Organizations: No    Attends Engineer, structural: Not on file    Marital Status: Married  Catering manager Violence: Not on file    Review of Systems  Respiratory:  Negative for cough, chest tightness and shortness of breath.   Cardiovascular:  Negative for chest pain, palpitations and leg swelling.  Neurological:  Negative for dizziness, light-headedness and headaches.     Objective:   Vitals:   06/23/23 0834  BP: 120/68  Pulse: 67  Temp: 98.4 F (36.9 C)  TempSrc: Temporal  SpO2: 97%  Weight: 245 lb 3.2 oz (111.2 kg)  Height: 5\' 9"  (1.753 m)     Physical Exam Vitals reviewed.  Constitutional:      Appearance: He is well-developed.  HENT:     Head: Normocephalic and atraumatic.  Neck:     Vascular: No carotid bruit or JVD.  Cardiovascular:     Rate and Rhythm: Normal rate and regular rhythm.     Heart sounds: Murmur heard.  Pulmonary:     Effort: Pulmonary effort is normal.     Breath sounds: Normal breath sounds. No rales.  Musculoskeletal:     Right lower leg: No edema.     Left lower leg: No edema.  Skin:    General: Skin is warm and dry.     Comments: Bilateral great toenails with thickening, slight discoloration, see photo.  Neurological:     Mental Status: He is alert and oriented to person, place, and time.  Psychiatric:        Mood and Affect: Mood normal.  Assessment & Plan:  Brand Siever is a 63 y.o. male . Anxiety - Plan: sertraline (ZOLOFT) 100 MG tablet  -Improved but still not optimal control, will try higher dose of sertraline at 100 mg daily, hydroxyzine if needed for breakthrough anxiety but anticipate decreased need with higher dose sertraline.  RTC precautions.  If stable 13-month follow-up.  Onychomycosis - Plan: Ambulatory referral to Podiatry, Comprehensive metabolic panel  -Minimal change with Lamisil, ciclopirox.  Will complete current course of Lamisil, check LFTs, refer to podiatry for a pending on  other treatment options given prolonged treatment and minimal change.  Decreased GFR - Plan: Comprehensive metabolic panel  -May be related to hypertension, he is avoiding NSAIDs.  He is on ACE inhibitor.  Check levels and determine follow-up or need for nephrology eval.  Medication monitoring encounter - Plan: Comprehensive metabolic panel   Meds ordered this encounter  Medications   sertraline (ZOLOFT) 100 MG tablet    Sig: Take 1 tablet (100 mg total) by mouth daily.    Dispense:  30 tablet    Refill:  3   Patient Instructions  I think it is reasonable to try the higher dose of sertraline, 100 mg/day.  If any new side effects on that dose let me know.  Hydroxyzine if needed for breakthrough symptoms.  I will check labs today including kidney function test but that was improved on your most recent testing.  Unfortunately without much change on the medications for toenail fungus it may be worthwhile meeting with podiatry to get their opinion.  I would finish the current dose of Lamisil and not start any new medicines at this time until meeting with podiatry.  Let me know if you do not receive a phone call from their office in the next few weeks.  No other med changes at this time.  If medications are working well, I can see you in 4 months, but happy to meet with you sooner if needed.  Take care!    Signed,   Meredith Staggers, MD [facility], [facility] 06/23/23 9:16 AM

## 2023-06-24 ENCOUNTER — Encounter: Payer: Self-pay | Admitting: Family Medicine

## 2023-06-29 ENCOUNTER — Ambulatory Visit: Payer: BLUE CROSS/BLUE SHIELD | Admitting: Behavioral Health

## 2023-07-03 ENCOUNTER — Encounter: Payer: Self-pay | Admitting: Podiatry

## 2023-07-03 ENCOUNTER — Ambulatory Visit (INDEPENDENT_AMBULATORY_CARE_PROVIDER_SITE_OTHER): Payer: BLUE CROSS/BLUE SHIELD | Admitting: Podiatry

## 2023-07-03 DIAGNOSIS — B351 Tinea unguium: Secondary | ICD-10-CM | POA: Diagnosis not present

## 2023-07-03 NOTE — Addendum Note (Signed)
 Addended by: Daryel November on: 07/03/2023 11:34 AM   Modules accepted: Orders

## 2023-07-03 NOTE — Progress Notes (Signed)
  Subjective:  Patient ID: Steven Sloan, male    DOB: 11/11/1960,   MRN: [id number]  No chief complaint on file.   63 y.o. male presents for concern of fungal nails that have been present for years. Patient denies any pain. Relates he has tried oral medication lamisil and penlac and not much changes has been on lamisil daily recently. Most of the changes noted on the left foot. Relates left great toe really the only one still with problems other nails have resolved . Denies any other pedal complaints. Denies n/v/f/c.   Past Medical History:  Diagnosis Date   Allergy    Seasonal   Anxiety    Aortic valve stenosis 08/10/2022   severe   Chicken pox    Heart murmur    Asymptomatic -- Patient endorses previous negative evaluation   High cholesterol    Hypertension    Sleep apnea    CPAP nightly    Objective:  Physical Exam: Vascular: DP/PT pulses 2/4 bilateral. CFT <3 seconds. Normal hair growth on digits. No edema.  Skin. No lacerations or abrasions bilateral feet. Left hallux nail with distal thickened and subungual debris.  Musculoskeletal: MMT 5/5 bilateral lower extremities in DF, PF, Inversion and Eversion. Deceased ROM in DF of ankle joint.  Neurological: Sensation intact to light touch.   Assessment:   1. Onychomycosis      Plan:  Patient was evaluated and treated and all questions answered. -Examined patient -Discussed treatment options for painful dystrophic nails  -Clinical picture and Fungal culture was obtained by removing a portion of the hard nail itself from each of the involved toenails using a sterile nail nipper and sent to [facility] lab. Patient tolerated the biopsy procedure well without discomfort or need for anesthesia.  -Discussed fungal nail treatment options including oral, topical, and laser treatments.  -Patient to return in 4 weeks for follow up evaluation and discussion of fungal culture results or sooner if symptoms worsen.   Louann Sjogren, DPM

## 2023-07-05 ENCOUNTER — Other Ambulatory Visit: Payer: Self-pay | Admitting: Family Medicine

## 2023-07-12 ENCOUNTER — Ambulatory Visit: Payer: BLUE CROSS/BLUE SHIELD | Admitting: Behavioral Health

## 2023-07-12 ENCOUNTER — Ambulatory Visit (INDEPENDENT_AMBULATORY_CARE_PROVIDER_SITE_OTHER): Payer: BLUE CROSS/BLUE SHIELD | Admitting: Internal Medicine

## 2023-07-12 ENCOUNTER — Encounter (INDEPENDENT_AMBULATORY_CARE_PROVIDER_SITE_OTHER): Payer: Self-pay | Admitting: Internal Medicine

## 2023-07-12 DIAGNOSIS — F331 Major depressive disorder, recurrent, moderate: Secondary | ICD-10-CM

## 2023-07-12 DIAGNOSIS — E66812 Obesity, class 2: Secondary | ICD-10-CM

## 2023-07-12 DIAGNOSIS — F411 Generalized anxiety disorder: Secondary | ICD-10-CM | POA: Diagnosis not present

## 2023-07-12 DIAGNOSIS — G4733 Obstructive sleep apnea (adult) (pediatric): Secondary | ICD-10-CM | POA: Diagnosis not present

## 2023-07-12 DIAGNOSIS — Z6836 Body mass index (BMI) 36.0-36.9, adult: Secondary | ICD-10-CM | POA: Diagnosis not present

## 2023-07-12 DIAGNOSIS — R7303 Prediabetes: Secondary | ICD-10-CM | POA: Diagnosis not present

## 2023-07-12 NOTE — Progress Notes (Signed)
 Office: [phone]  /  Fax: [phone]  Weight Summary And Biometrics  Vitals Temp: (!) 97.3 F (36.3 C) BP: 136/74 Pulse Rate: 60 SpO2: 96 %   Anthropometric Measurements Height: 5\' 9"  (1.753 m) Weight: 241 lb (109.3 kg) BMI (Calculated): 35.57 Weight at Last Visit: 241 lb Weight Lost Since Last Visit: 0 lb Weight Gained Since Last Visit: 0lb Starting Weight: 247 lb Total Weight Loss (lbs): 6 lb (2.722 kg) Peak Weight: 285 lb   Body Composition  Body Fat %: 30.1 % Fat Mass (lbs): 72.8 lbs Muscle Mass (lbs): 160.6 lbs Total Body Water (lbs): 114.8 lbs Visceral Fat Rating : 18    No data recorded Today's Visit #: 5  Starting Date: 03/21/23   Subjective   Chief Complaint: Obesity  Steven Sloan is here to discuss his progress with his obesity treatment plan. He is on the the Category 4 Plan and states he is following his eating plan approximately 50-60% of the time. He states he is exercising 60 minutes 5 times per week.  Weight Progress Since Last Visit:  Since last office visit he has maintained weight. He reports variable adherence to reduced calorie nutritional plan He has been working on not skipping meals, increasing protein intake at every meal, drinking more water, making healthier choices, reducing portion sizes, incorporating more whole foods, and acknowledges some variances in his diet    Challenges affecting patient progress: cost of medication, strong hunger signals and/or impaired satiety / inhibitory control, having difficulty with meal prep and planning, and having difficulty focusing on healthy eating.   Orexigenic Control: Reports problems with appetite and hunger signals.  Reports problems with satiety and satiation.  Denies problems with eating patterns and portion control.  Denies abnormal cravings. Denies feeling deprived or restricted.   Pharmacotherapy for weight management: He is currently taking  we had prescribed Zepbound last  office visit but because of high deductible plan his out-of-pocket expense is around the thousand dollars and is cost prohibitive .   Assessment and Plan   Treatment Plan For Obesity:  Recommended Dietary Goals  Steven Sloan is currently in the action stage of change. As such, his goal is to continue weight management plan. He has agreed to: continue current plan.  Patient will continue to work on implementation of different elements of the plan.  Behavioral Health and Counseling  We discussed the following behavioral modification strategies today: continue to work on maintaining a reduced calorie state, getting the recommended amount of protein, incorporating whole foods, making healthy choices, staying well hydrated and practicing mindfulness when eating..  Additional education and resources provided today: None  Recommended Physical Activity Goals  Steven Sloan has been advised to work up to 150 minutes of moderate intensity aerobic activity a week and strengthening exercises 2-3 times per week for cardiovascular health, weight loss maintenance and preservation of muscle mass.   He has agreed to :  Think about enjoyable ways to increase daily physical activity and overcoming barriers to exercise and Increase physical activity in their day and reduce sedentary time (increase NEAT).  Pharmacotherapy  We discussed various medication options to help Steven Sloan with his weight loss efforts and we both agreed to :  We discussed Qsymia as a possible option.  We also reviewed a  savings program from drug company he will discuss with his wife  Associated Conditions Impacted by Obesity Treatment  OSA (obstructive sleep apnea)  Class 2 severe obesity with serious comorbidity and body mass index (BMI) of  36.0 to 36.9 in adult, unspecified obesity type (HCC)  Prediabetes    Assessment and Plan    Obesity Steven Sloan is affected by obesity and is seeking medical weight management. He has been prescribed  Zepbound, but the cost is prohibitive due to insurance coverage issues. He is following a meal plan 50-60% of the time, tracking calories, and exercising five days a week. He is considering alternative medications due to financial constraints and dental expenses. Zepbound requires a long-term commitment of at least two years to prevent weight regain. Alternative options include Qsymia, which offers about 10% weight loss, and topiramate alone, which can aid in appetite suppression. The decision involves balancing financial considerations with the need for effective weight management. - Discuss Zepbound savings program with Steven Sloan and provide information on vial option - Consider alternative medications such as Qsymia or topiramate alone - Encourage continued focus on nutrition and exercise - Discuss potential use of GOP1 medication in the future  Prediabetes Steven Sloan has prediabetes. He is managing his condition through diet and exercise, although he occasionally skips meals and consumes sugary foods. Maintaining a consistent diet and tracking calories is crucial for managing weight and blood sugar levels. - Encourage consistent meal planning and tracking of calories - Advise on reducing sugar intake and maintaining a low-carb diet -Consider treatment with metformin if GLP-1 is cost prohibitive  Aortic stenosis Asymptomatic Steven Sloan has a systolic murmur and is planning to undergo heart valve surgery. The timing of the procedure is not yet determined, and he is currently asymptomatic during exercise. The use of phentermine is deferred until after the valve repair due to potential cardiovascular effects. - Monitor for symptoms and plan for heart valve surgery as needed  Sleep Apnea On CPAP with reported good compliance. Continue PAP therapy. Losing 15% or more of body weight may improve AHI.     General Health Maintenance Steven Sloan is engaging in regular exercise and is aware of the importance of maintaining  a healthy lifestyle. He is experiencing some stress, which may impact his health and weight management efforts. - Encourage regular exercise and stress management techniques - Advise on maintaining adequate hydration and protein intake       Objective   Physical Exam:  Blood pressure 136/74, pulse 60, temperature (!) 97.3 F (36.3 C), height 5\' 9"  (1.753 m), weight 241 lb (109.3 kg), SpO2 96%. Body mass index is 35.59 kg/m.  General: He is overweight, cooperative, alert, well developed, and in no acute distress. PSYCH: Has normal mood, affect and thought process.   HEENT: EOMI, sclerae are anicteric. Lungs: Normal breathing effort, no conversational dyspnea. Extremities: No edema.  Neurologic: No gross sensory or motor deficits. No tremors or fasciculations noted.    Diagnostic Data Reviewed:  BMET    Component Value Date/Time   NA 136 06/23/2023 0923   K 4.0 06/23/2023 0923   CL 102 06/23/2023 0923   CO2 29 06/23/2023 0923   GLUCOSE 99 06/23/2023 0923   BUN 25 (H) 06/23/2023 0923   CREATININE 1.44 06/23/2023 0923   CALCIUM 9.7 06/23/2023 0923   GFRNONAA >60 04/09/2015 1140   GFRAA >60 04/09/2015 1140   Lab Results  Component Value Date   HGBA1C 5.3 03/21/2023   HGBA1C 5.0 07/22/2015   Lab Results  Component Value Date   INSULIN 12.6 03/21/2023   Lab Results  Component Value Date   TSH 0.78 04/10/2023   CBC    Component Value Date/Time   WBC 6.4 04/10/2023 1034  RBC 5.11 04/10/2023 1034   HGB 15.9 04/10/2023 1034   HCT 46.9 04/10/2023 1034   PLT 224.0 04/10/2023 1034   MCV 91.8 04/10/2023 1034   MCH 31.8 04/09/2015 1140   MCHC 34.0 04/10/2023 1034   RDW 12.9 04/10/2023 1034   Iron Studies No results found for: "IRON", "TIBC", "FERRITIN", "IRONPCTSAT" Lipid Panel     Component Value Date/Time   CHOL 115 04/10/2023 1034   TRIG 78.0 04/10/2023 1034   HDL 45.40 04/10/2023 1034   CHOLHDL 3 04/10/2023 1034   VLDL 15.6 04/10/2023 1034   LDLCALC 54  04/10/2023 1034   Hepatic Function Panel     Component Value Date/Time   PROT 7.9 06/23/2023 0923   ALBUMIN 4.6 06/23/2023 0923   AST 22 06/23/2023 0923   ALT 27 06/23/2023 0923   ALKPHOS 58 06/23/2023 0923   BILITOT 0.8 06/23/2023 0923   BILIDIR 0.2 12/07/2022 0933      Component Value Date/Time   TSH 0.78 04/10/2023 1034   Nutritional Lab Results  Component Value Date   VD25OH 40.9 03/21/2023    Follow-Up   Return in about 4 weeks (around 08/09/2023).Marland Kitchen He was informed of the importance of frequent follow up visits to maximize his success with intensive lifestyle modifications for his multiple health conditions.  Attestation Statement   Reviewed by clinician on day of visit: allergies, medications, problem list, medical history, surgical history, family history, social history, and previous encounter notes.     Worthy Rancher, MD

## 2023-07-12 NOTE — Progress Notes (Signed)
 [facility] Counselor/Therapist Progress Note  Patient ID: Steven Sloan, MRN: [id number],    Date: 07/12/2023  Time Spent: 58 minutes, 11 AM until 11:58 AM.This session was held via video teletherapy. The patient consented to the video teletherapy and was located in his home office during this session. He is aware it is the responsibility of the patient to secure confidentiality on his end of the session. The provider was in a private home office for the duration of this session.    Treatment Type: Individual Therapy  Reported Symptoms: Anxiety, stress, depression  Mental Status Exam: Appearance:  Well Groomed     Behavior: Appropriate  Motor: Normal  Speech/Language:  Normal Rate  Affect: Appropriate  Mood: normal  Thought process: normal  Thought content:   WNL  Sensory/Perceptual disturbances:   WNL  Orientation: oriented to person, place, time/date, situation, day of week, month of year, and year  Attention: Good  Concentration: Good  Memory: WNL  Fund of knowledge:  Good  Insight:   Good  Judgment:  Good  Impulse Control: Good   Risk Assessment: Danger to Self:  No Self-injurious Behavior: No Danger to Others: No Duty to Warn:no Physical Aggression / Violence:No  Access to Firearms a concern: No  Gang Involvement:No   Subjective: The patient has a meeting with his doctor this afternoon to look at other alternatives for weight loss medication.  The goal is to get his weight down prior to having heart surgery.  The diet/nutrition plan that he is on is very difficult for him to follow as he is fighting temptation especially with the sweet stuff late at night.  We have been working on some ways to challenge that cognitively or to redirect his thoughts and need to continue to look at that.  The medication that was prescribed is not affordable currently for him so he wants to talk about other options that might be helpful in the weight loss area.  For the most  part he has not started noticing any signs of changes with his heart and he is thankful for that.  His wife is getting better with her knee so she is starting to chip in a little bit more with helping out around the house which she is thankful for.  He is trying to balance doing his job which she has flexibility with while at the same time including his daughter in activities such as swimming and karate.  He said she needs that social interaction as well as things to keep her busy.  Her diagnosis and how that impacts how he helps care for her and looks for programs and/or things that she may benefit from also.  He still presents with significant anxiety but is using coping skills as well as structure to help reduce that as much as possible. He does contract for safety having no thoughts of hurting himself or anyone else.  Interventions: Cognitive Behavioral Therapy and Dialectical Behavioral Therapy Diagnosis:Generalized anxiety disorder  Plan: I will meet with the patient every 2 to 3 weeks. Treatment plan: Goals are to reduce the patient's anxiety and some mild depression primarily related to medical condition but also other family factors by at least 50% with a target date of July 31, 2023.  Goals for minimizing depression are for the patient to have less sadness as indicated by his report and PHQ-9 scores, have improved mood and return to a healthier level of functioning.  We will look at any other causes for  depression in addition to those stated above.  We will explore how depression is experienced by him, use cognitive behavioral therapy to explore and replace unhealthy thought and behavior patterns contributing to depression as well as encouraging the sharing of feelings related to causes and symptoms of depression.  We will introduce coping skills for managing depressive symptoms.  Goals for reducing anxiety include improving his ability to better manage stress and anxiety symptoms, identify  causes for anxiety and introduce ways to lower it including resolving core conflicts contributing to the anxiety.  Finally a goal for the patient will be to manage thoughts and worrisome thinking contributing to feelings of anxiety.  Interventions include providing education about anxiety to help him understand his causes and triggers, facilitate problem solution skills and coping skills for managing anxiety.  We will also use cognitive behavioral therapy to identify and change anxiety provoking thought and behavior patterns as well as dialectical behavior therapy to introduce distress tolerance and mindfulness skills.  Progress: 25%  Steven Ana, [facility]                  Steven Ana, [facility]               Steven Ana, [facility]               Steven Ana, [facility]               Steven Ana, [facility]  [facility] Counselor/Therapist Progress Note  Patient ID: Steven Sloan, MRN: [id number],    Date: 07/12/2023  Time Spent: 60 minutes, 11 AM until 12 PM.This session was held via video teletherapy. The patient consented to the video teletherapy and was located in his home office during this session. He is aware it is the responsibility of the patient to secure confidentiality on his end of the session. The provider was in a private home office for the duration of this session.    Treatment Type: Individual Therapy  Reported Symptoms: Anxiety, stress, depression  Mental Status Exam: Appearance:  Well Groomed     Behavior: Appropriate  Motor: Normal  Speech/Language:  Normal Rate  Affect: Appropriate  Mood: normal  Thought process: normal  Thought content:   WNL  Sensory/Perceptual disturbances:   WNL  Orientation: oriented to person, place, time/date, situation, day of week, month of year, and year  Attention: Good  Concentration: Good  Memory: WNL  Fund of knowledge:  Good  Insight:   Good  Judgment:  Good   Impulse Control: Good   Risk Assessment: Danger to Self:  No Self-injurious Behavior: No Danger to Others: No Duty to Warn:no Physical Aggression / Violence:No  Access to Firearms a concern: No  Gang Involvement:No   Subjective: It has been a busy couple of weeks for the patient.  There are been several things he said to do for his daughter.  He is meeting with the school today for an IEP meeting which is always important for her.  Work has been fairly busy.  His wife is doing better physically but she continues radiation for now.  He was prescribed Septra bound by his doctor to aid in his weight loss prior to his heart surgery but he said financially it is too restrictive and he has to have that conversation with his medical doctor.  He is walking consistently and seeing some weight reduction but hoped that would be a booster for him.  We looked at things that  he can do cognitively and physically to help with what they describe as "food noise" in his brain.  We looked at things such as not keeping food items around the house that are tempting for him but also how he can use cognitive reframing and challenging to help reduce the temptation to eat things that he knows he does not need to eat.  Not validated the work that he is putting in at all levels.  He is working on the cognitive pairing that we talked about with the negative thoughts that run through his head in relation to his physical health.  He is still feeling okay and is not having any symptoms that he thinks would contribute to the beginning of getting ready for the heart surgery.  He does contract for safety having no thoughts of hurting himself or anyone else.  Interventions: Cognitive Behavioral Therapy and Dialectical Behavioral Therapy Diagnosis:Generalized anxiety disorder  Plan: I will meet with the patient every 2 to 3 weeks. Treatment plan: Goals are to reduce the patient's anxiety and some mild depression primarily related  to medical condition but also other family factors by at least 50% with a target date of July 31, 2023.  Goals for minimizing depression are for the patient to have less sadness as indicated by his report and PHQ-9 scores, have improved mood and return to a healthier level of functioning.  We will look at any other causes for depression in addition to those stated above.  We will explore how depression is experienced by him, use cognitive behavioral therapy to explore and replace unhealthy thought and behavior patterns contributing to depression as well as encouraging the sharing of feelings related to causes and symptoms of depression.  We will introduce coping skills for managing depressive symptoms.  Goals for reducing anxiety include improving his ability to better manage stress and anxiety symptoms, identify causes for anxiety and introduce ways to lower it including resolving core conflicts contributing to the anxiety.  Finally a goal for the patient will be to manage thoughts and worrisome thinking contributing to feelings of anxiety.  Interventions include providing education about anxiety to help him understand his causes and triggers, facilitate problem solution skills and coping skills for managing anxiety.  We will also use cognitive behavioral therapy to identify and change anxiety provoking thought and behavior patterns as well as dialectical behavior therapy to introduce distress tolerance and mindfulness skills.  Progress: 25%  Steven Ana, [facility]                  Steven Ana, [facility]               Steven Ana, [facility]               Steven Ana, [facility]               Steven Ana, [facility]               Steven Ana, [facility]               Steven Ana, [facility]               Steven Ana, [facility]               Steven Ana, [facility]

## 2023-07-15 ENCOUNTER — Other Ambulatory Visit: Payer: Self-pay | Admitting: Family Medicine

## 2023-07-15 DIAGNOSIS — I10 Essential (primary) hypertension: Secondary | ICD-10-CM

## 2023-07-17 ENCOUNTER — Other Ambulatory Visit: Payer: Self-pay | Admitting: Podiatry

## 2023-07-18 ENCOUNTER — Other Ambulatory Visit: Payer: Self-pay | Admitting: Family Medicine

## 2023-07-18 DIAGNOSIS — F419 Anxiety disorder, unspecified: Secondary | ICD-10-CM

## 2023-07-26 ENCOUNTER — Ambulatory Visit (INDEPENDENT_AMBULATORY_CARE_PROVIDER_SITE_OTHER): Payer: BLUE CROSS/BLUE SHIELD | Admitting: Behavioral Health

## 2023-07-26 ENCOUNTER — Encounter: Payer: Self-pay | Admitting: Behavioral Health

## 2023-07-26 ENCOUNTER — Encounter (INDEPENDENT_AMBULATORY_CARE_PROVIDER_SITE_OTHER): Payer: Self-pay | Admitting: Internal Medicine

## 2023-07-26 DIAGNOSIS — F411 Generalized anxiety disorder: Secondary | ICD-10-CM

## 2023-07-26 DIAGNOSIS — F4323 Adjustment disorder with mixed anxiety and depressed mood: Secondary | ICD-10-CM

## 2023-07-26 NOTE — Progress Notes (Signed)
 [facility] Counselor/Therapist Progress Note  Patient ID: Steven Sloan, MRN: [id number],    Date: 07/26/2023  Time Spent: 58 minutes, 11 AM until 11:58 AM.This session was held via video teletherapy. The patient consented to the video teletherapy and was located in his home office during this session. He is aware it is the responsibility of the patient to secure confidentiality on his end of the session. The provider was in a private home office for the duration of this session.    Treatment Type: Individual Therapy  Reported Symptoms: Anxiety, stress, depression  Mental Status Exam: Appearance:  Well Groomed     Behavior: Appropriate  Motor: Normal  Speech/Language:  Normal Rate  Affect: Appropriate  Mood: normal  Thought process: normal  Thought content:   WNL  Sensory/Perceptual disturbances:   WNL  Orientation: oriented to person, place, time/date, situation, day of week, month of year, and year  Attention: Good  Concentration: Good  Memory: WNL  Fund of knowledge:  Good  Insight:   Good  Judgment:  Good  Impulse Control: Good   Risk Assessment: Danger to Self:  No Self-injurious Behavior: No Danger to Others: No Duty to Warn:no Physical Aggression / Violence:No  Access to Firearms a concern: No  Gang Involvement:No   Subjective: The patient is overwhelmed.  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  There are things that only the patient knows how to do and especially recently he has gotten multiple requests and he has at least 16 things to do which take time and all of them at least according to those who requested our priority.  He said that creates a little trauma response because of his situation almost 13 years ago when which he was tolerated although he had done everything well to the point he ended up getting fired.  He  said he tried everything has continued to be better and could not keep up because it was unrealistic.  He was within days hired back in a different apartment where he has thrived but says when he gets this overwhelmed that is what he remembers.  We talked about how to cognitively challenge those thoughts and reframe them as well as using coping skills to minimize the stress and anxiety that comes along with that.  He also acknowledged being a perfectionist so we talked about how to use gradual reduction therapy to begin to let go of those things which he can let someone else do.  We talked about him building the framework to let others finish building the project.  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  At home he is trying to start to delegate things more.  His wife is physically able to do more now so he is looking at things that he can turn over to her or at least have her do to do while he is out but also has a way to start to divest himself of some of  the many things he does at home.  He does contract for safety having no thoughts of hurting himself or anyone else.  Interventions: Cognitive Behavioral Therapy and Dialectical Behavioral Therapy Diagnosis:Generalized anxiety disorder  Plan: I will meet with the patient every 2 to 3 weeks. Treatment plan: Goals are to reduce the patient's anxiety and some mild depression primarily related to medical condition but also other family factors by at least 50% with a target date of July 31, 2023.  Goals for minimizing depression are for the patient to have less sadness as indicated by his report and PHQ-9 scores, have improved mood and  return to a healthier level of functioning.  We will look at any other causes for depression in addition to those stated above.  We will explore how depression is experienced by him, use cognitive behavioral therapy to explore and replace unhealthy thought and behavior patterns contributing to depression as well as encouraging the sharing of feelings related to causes and symptoms of depression.  We will introduce coping skills for managing depressive symptoms.  Goals for reducing anxiety include improving his ability to better manage stress and anxiety symptoms, identify causes for anxiety and introduce ways to lower it including resolving core conflicts contributing to the anxiety.  Finally a goal for the patient will be to manage thoughts and worrisome thinking contributing to feelings of anxiety.  Interventions include providing education about anxiety to help him understand his causes and triggers, facilitate problem solution skills and coping skills for managing anxiety.  We will also use cognitive behavioral therapy to identify and change anxiety provoking thought and behavior patterns as well as dialectical behavior therapy to introduce distress tolerance and mindfulness skills. I reviewed the treatment goals as listed above with patient's agreement and extended the target date to January 30, 2024. Progress: 30%  French Ana, [facility]

## 2023-07-27 ENCOUNTER — Other Ambulatory Visit: Payer: Self-pay | Admitting: Family Medicine

## 2023-07-31 ENCOUNTER — Ambulatory Visit (INDEPENDENT_AMBULATORY_CARE_PROVIDER_SITE_OTHER): Admitting: Podiatry

## 2023-07-31 ENCOUNTER — Other Ambulatory Visit (INDEPENDENT_AMBULATORY_CARE_PROVIDER_SITE_OTHER): Payer: Self-pay | Admitting: Internal Medicine

## 2023-07-31 ENCOUNTER — Encounter: Payer: Self-pay | Admitting: Podiatry

## 2023-07-31 DIAGNOSIS — B351 Tinea unguium: Secondary | ICD-10-CM | POA: Diagnosis not present

## 2023-07-31 DIAGNOSIS — G4733 Obstructive sleep apnea (adult) (pediatric): Secondary | ICD-10-CM

## 2023-07-31 DIAGNOSIS — R7303 Prediabetes: Secondary | ICD-10-CM

## 2023-07-31 MED ORDER — TERBINAFINE HCL 250 MG PO TABS: 250.0000 mg | ORAL_TABLET | Freq: Every day | ORAL | 2 refills | Status: AC

## 2023-07-31 MED ORDER — TIRZEPATIDE-WEIGHT MANAGEMENT 5 MG/0.5ML ~~LOC~~ SOAJ: 5.0000 mg | SUBCUTANEOUS | 0 refills | Status: DC

## 2023-07-31 NOTE — Progress Notes (Signed)
  Subjective:  Patient ID: Steven Sloan, male    DOB: Jan 31, 1961,   MRN: [id number]  No chief complaint on file.   63 y.o. male presents for follow-up of fungal nails and to discuss culture results.  Denies any other pedal complaints. Denies n/v/f/c.   Past Medical History:  Diagnosis Date   Allergy    Seasonal   Anxiety    Aortic valve stenosis 08/10/2022   severe   Chicken pox    Heart murmur    Asymptomatic -- Patient endorses previous negative evaluation   High cholesterol    Hypertension    Sleep apnea    CPAP nightly    Objective:  Physical Exam: Vascular: DP/PT pulses 2/4 bilateral. CFT <3 seconds. Normal hair growth on digits. No edema.  Skin. No lacerations or abrasions bilateral feet. Left hallux nail with distal thickened and subungual debris.  Musculoskeletal: MMT 5/5 bilateral lower extremities in DF, PF, Inversion and Eversion. Deceased ROM in DF of ankle joint.  Neurological: Sensation intact to light touch.   Assessment:   1. Onychomycosis       Plan:  Patient was evaluated and treated and all questions answered. -Examined patient -Discussed treatment options for painful dystrophic nails  -Culture positive for fungus. T rubrum.  -Discussed fungal nail treatment options including oral, topical, and laser treatments.  Patient would like to try laser treatment but would also like to continue lamisil as well.  LFTS reviewed and wnl. Lamisil sent to pharmacy x 90 days.  Will get scheduled for laser as well.  -Patient to return in 3 months for recheck  Louann Sjogren, DPM

## 2023-08-09 ENCOUNTER — Other Ambulatory Visit: Payer: Self-pay | Admitting: Acute Care

## 2023-08-09 DIAGNOSIS — Z87891 Personal history of nicotine dependence: Secondary | ICD-10-CM

## 2023-08-15 ENCOUNTER — Encounter: Payer: Self-pay | Admitting: Behavioral Health

## 2023-08-15 ENCOUNTER — Ambulatory Visit (INDEPENDENT_AMBULATORY_CARE_PROVIDER_SITE_OTHER): Admitting: Behavioral Health

## 2023-08-15 DIAGNOSIS — F4323 Adjustment disorder with mixed anxiety and depressed mood: Secondary | ICD-10-CM

## 2023-08-15 DIAGNOSIS — F411 Generalized anxiety disorder: Secondary | ICD-10-CM

## 2023-08-15 NOTE — Progress Notes (Signed)
 [facility] Counselor/Therapist Progress Note  Patient ID: Steven Sloan, MRN: [id number],    Date: 08/15/2023  Time Spent: 51 minutes, 9:03 AM until 9:54 AM.This session was held via video teletherapy. The patient consented to the video teletherapy and was located in his home office during this session. He is aware it is the responsibility of the patient to secure confidentiality on his end of the session. The provider was in a private home office for the duration of this session.    Treatment Type: Individual Therapy  Reported Symptoms: Anxiety, stress, depression  Mental Status Exam: Appearance:  Well Groomed     Behavior: Appropriate  Motor: Normal  Speech/Language:  Normal Rate  Affect: Appropriate  Mood: normal  Thought process: normal  Thought content:   WNL  Sensory/Perceptual disturbances:   WNL  Orientation: oriented to person, place, time/date, situation, day of week, month of year, and year  Attention: Good  Concentration: Good  Memory: WNL  Fund of knowledge:  Good  Insight:   Good  Judgment:  Good  Impulse Control: Good   Risk Assessment: Danger to Self:  No Self-injurious Behavior: No Danger to Others: No Duty to Warn:no Physical Aggression / Violence:No  Access to Firearms a concern: No  Gang Involvement:No   Subjective: This is a good week for the patient as he is on a state occasion.  His wife and daughter going to [employer] for the week.  Typically he would go with them but because of his medical condition he did not want to be in [place] away from his medical care team.  He is feeling good with no symptoms related to heart issues but wanted to play it safe.  He related that his father had a heart attack while in [place] on work years ago while his father was living in [place].  Thankfully he was in a good position with good medical facilities but the patient does not want to have to go through something like that.  His primary concern  today was anxiety about being around some family members on Easter that he does not have a great relationship in who he sees are taking advantage of his parents.  There is a history there of the siblings not taking care of themselves and expecting somebody else to do that.  I encouraged him to set verbal physical time boundaries etc. to make that better for he and his family and to stand through him things that he has said to them in the past that have not changed. He does contract for safety having no thoughts of hurting himself or anyone else.  Interventions: Cognitive Behavioral Therapy and Dialectical Behavioral Therapy Diagnosis:Generalized anxiety disorder  Plan: I will meet with the patient every 2 to 3 weeks. Treatment plan: Goals are to reduce the patient's anxiety and some mild depression primarily related to medical condition but also other family factors by at least 50% with a target date of July 31, 2023.  Goals for minimizing depression are for the patient to have less sadness as indicated by his report and PHQ-9 scores, have improved mood and return to a healthier level of functioning.  We will look at any other causes for depression in addition to those stated above.  We will explore how depression is experienced by him, use cognitive behavioral therapy to explore and replace unhealthy thought and behavior patterns contributing to depression as well as encouraging the sharing of feelings related to causes and symptoms of depression.  We will introduce coping skills for managing depressive symptoms.  Goals for reducing anxiety include improving his ability to better manage stress and anxiety symptoms, identify causes for anxiety and introduce ways to lower it including resolving core conflicts contributing to the anxiety.  Finally a goal for the patient will be to manage thoughts and worrisome thinking contributing to feelings of anxiety.  Interventions include providing education about  anxiety to help him understand his causes and triggers, facilitate problem solution skills and coping skills for managing anxiety.  We will also use cognitive behavioral therapy to identify and change anxiety provoking thought and behavior patterns as well as dialectical behavior therapy to introduce distress tolerance and mindfulness skills. I reviewed the treatment goals as listed above with patient's agreement and extended the target date to January 30, 2024. Progress: 30%  Cecile Coder, [facility]                                Cecile Coder, [facility]

## 2023-08-17 ENCOUNTER — Encounter (INDEPENDENT_AMBULATORY_CARE_PROVIDER_SITE_OTHER): Payer: Self-pay | Admitting: Internal Medicine

## 2023-08-17 ENCOUNTER — Ambulatory Visit (INDEPENDENT_AMBULATORY_CARE_PROVIDER_SITE_OTHER): Admitting: Internal Medicine

## 2023-08-17 DIAGNOSIS — E66812 Obesity, class 2: Secondary | ICD-10-CM

## 2023-08-17 DIAGNOSIS — G4733 Obstructive sleep apnea (adult) (pediatric): Secondary | ICD-10-CM

## 2023-08-17 DIAGNOSIS — Z6834 Body mass index (BMI) 34.0-34.9, adult: Secondary | ICD-10-CM

## 2023-08-17 DIAGNOSIS — R7303 Prediabetes: Secondary | ICD-10-CM

## 2023-08-17 MED ORDER — MULTI-VITAMIN/MINERALS PO TABS: 1.0000 | ORAL_TABLET | Freq: Every day | ORAL | Status: AC

## 2023-08-17 MED ORDER — ZEPBOUND 7.5 MG/0.5ML ~~LOC~~ SOAJ: 7.5000 mg | SUBCUTANEOUS | 0 refills | Status: DC

## 2023-08-17 NOTE — Progress Notes (Signed)
 Office: [phone]  /  Fax: [phone]  Weight Summary And Biometrics  Vitals Temp: (!) 97.5 F (36.4 C) BP: 111/72 Pulse Rate: 72   Anthropometric Measurements Height: 5\' 9"  (1.753 m) Weight: 236 lb (107 kg) BMI (Calculated): 34.84 Weight at Last Visit: 241 lb Weight Lost Since Last Visit: 5 lb Weight Gained Since Last Visit: 0 lb Starting Weight: 247 lb Total Weight Loss (lbs): 11 lb (4.99 kg) Peak Weight: 285 lb   Body Composition  Body Fat %: 29.4 % Fat Mass (lbs): 69.4 lbs Muscle Mass (lbs): 158.6 lbs Total Body Water (lbs): 112.4 lbs Visceral Fat Rating : 17    No data recorded Today's Visit #: 5  Starting Date: 03/21/23   Subjective   Chief Complaint: Obesity  Interval History Discussed the use of AI scribe software for clinical note transcription with the patient, who gave verbal consent to proceed.  History of Present Illness   Steven Sloan is a 63 year old male presenting for medical weight management.  Steven Sloan has lost five pounds since his last visit, attributing this to adherence to a category four plan approximately 70-80% of the time. Steven Sloan is tracking calories, consuming more whole foods, and ensuring adequate protein intake. Steven Sloan exercises five days a week for 60 minutes each session.  Steven Sloan has started a prescription for Zepbound, having completed four doses at 2.5 mg and one dose at 5 mg. Steven Sloan feels good with no significant side effects, although Steven Sloan is unsure of what symptoms to monitor. No dizziness or lightheadedness.  Steven Sloan occasionally skips meals, particularly breakfast, due to a lack of hunger in the mornings, which Steven Sloan attributes to his current vacation schedule. Steven Sloan acknowledges difficulty in meeting his protein intake goals when skipping meals.  Steven Sloan experiences cravings at night, particularly for ice cream and [place] ice, and notes that these items are sometimes available at home, which makes it challenging to resist.  Steven Sloan is aware of the importance of  maintaining muscle mass during weight loss and is focused on exercise and protein intake to support this. Steven Sloan has noticed a reduction in body fat percentage and visceral fat.     Challenges affecting patient progress: strong hunger signals and/or impaired satiety / inhibitory control.    Pharmacotherapy for weight management: Steven Sloan is currently taking Zepbound with adequate clinical response  and without side effects..   Assessment and Plan   Treatment Plan For Obesity:  Recommended Dietary Goals  Steven Sloan is currently in the action stage of change. As such, his goal is to continue weight management plan. Steven Sloan has agreed to: continue current plan  Behavioral Health and Counseling  We discussed the following behavioral modification strategies today: continue to work on maintaining a reduced calorie state, getting the recommended amount of protein, incorporating whole foods, making healthy choices, staying well hydrated and practicing mindfulness when eating..  Additional education and resources provided today: None  Recommended Physical Activity Goals  Steven Sloan has been advised to work up to 150 minutes of moderate intensity aerobic activity a week and strengthening exercises 2-3 times per week for cardiovascular health, weight loss maintenance and preservation of muscle mass.   Steven Sloan has agreed to :  Continue current level of physical activity   Pharmacotherapy  We discussed various medication options to help Steven Sloan with his weight loss efforts and we both agreed to : increase Zepbound to 7.5 mg once a week  Associated Conditions Impacted by Obesity Treatment  Class 2 severe obesity with serious comorbidity and body mass  index (BMI) of 36.0 to 36.9 in adult, unspecified obesity type (HCC) -     Zepbound; Inject 7.5 mg into the skin once a week.  Dispense: 2 mL; Refill: 0  OSA (obstructive sleep apnea) -     Zepbound; Inject 7.5 mg into the skin once a week.  Dispense: 2 mL; Refill:  0  Prediabetes -     Zepbound; Inject 7.5 mg into the skin once a week.  Dispense: 2 mL; Refill: 0     Assessment and Plan    Obesity /OSA/prediabetes Steven Sloan has lost five pounds since the last visit on a category four weight management plan. Currently on Zepbound, having completed four doses at 2.5 mg and one dose at 5 mg. The medication suppresses appetite and delays gastric emptying, aiding in portion control and reducing cravings. Steven Sloan experiences nighttime cravings for ice cream and [place] ice. Steven Sloan has lost two pounds of muscle, over 20% of total weight loss, indicating a need to preserve muscle mass. Discussed maintaining protein intake and exercise to spare muscle and prevent metabolic rate reduction. Considering increasing Zepbound to 7.5 mg to better manage cravings.  Reviewed nutritional strategies to avoid side effects - Continue Zepbound at 5 mg for four weeks, then increase to 7.5 mg. - Monitor body composition to ensure muscle mass is preserved. - Increase protein intake, possibly with a morning protein shake. - Remove high-sugar and high-fat foods from the home to reduce cravings. - Engage in regular exercise to maintain muscle mass. - Take a multivitamin with minerals to prevent nutrient deficiencies.  Prediabetes Continue Zepbound for pharmacoprophylaxis  OSA Losing 15% of body weight may reduce AHI continue current weight management strategy inclusive of GLP-1 therapy  Hypertension Blood pressure is at a low normal level. Advised monitoring for symptoms of dizziness or lightheadedness, which could indicate the need for medication adjustment. - Monitor blood pressure for symptoms of dizziness or lightheadedness. - Ensure adequate hydration to support blood pressure management.  General Health Maintenance Emphasized maintaining hydration, protein, and fiber intake to support metabolic rate and prevent constipation. Advised taking a multivitamin with minerals to prevent  deficiencies due to reduced caloric intake. - Maintain adequate hydration, protein, and fiber intake. - Take a multivitamin with minerals.  Recording duration: 13 minutes        Objective   Physical Exam:  Blood pressure 111/72, pulse 72, temperature (!) 97.5 F (36.4 C), height 5\' 9"  (1.753 m), weight 236 lb (107 kg). Body mass index is 34.85 kg/m.  General: Steven Sloan is overweight, cooperative, alert, well developed, and in no acute distress. PSYCH: Has normal mood, affect and thought process.   HEENT: EOMI, sclerae are anicteric. Lungs: Normal breathing effort, no conversational dyspnea. Extremities: No edema.  Neurologic: No gross sensory or motor deficits. No tremors or fasciculations noted.    Diagnostic Data Reviewed:  BMET    Component Value Date/Time   NA 136 06/23/2023 0923   K 4.0 06/23/2023 0923   CL 102 06/23/2023 0923   CO2 29 06/23/2023 0923   GLUCOSE 99 06/23/2023 0923   BUN 25 (H) 06/23/2023 0923   CREATININE 1.44 06/23/2023 0923   CALCIUM 9.7 06/23/2023 0923   GFRNONAA >60 04/09/2015 1140   GFRAA >60 04/09/2015 1140   Lab Results  Component Value Date   HGBA1C 5.3 03/21/2023   HGBA1C 5.0 07/22/2015   Lab Results  Component Value Date   INSULIN 12.6 03/21/2023   Lab Results  Component Value Date   TSH  0.78 04/10/2023   CBC    Component Value Date/Time   WBC 6.4 04/10/2023 1034   RBC 5.11 04/10/2023 1034   HGB 15.9 04/10/2023 1034   HCT 46.9 04/10/2023 1034   PLT 224.0 04/10/2023 1034   MCV 91.8 04/10/2023 1034   MCH 31.8 04/09/2015 1140   MCHC 34.0 04/10/2023 1034   RDW 12.9 04/10/2023 1034   Iron Studies No results found for: "IRON", "TIBC", "FERRITIN", "IRONPCTSAT" Lipid Panel     Component Value Date/Time   CHOL 115 04/10/2023 1034   TRIG 78.0 04/10/2023 1034   HDL 45.40 04/10/2023 1034   CHOLHDL 3 04/10/2023 1034   VLDL 15.6 04/10/2023 1034   LDLCALC 54 04/10/2023 1034   Hepatic Function Panel     Component Value Date/Time    PROT 7.9 06/23/2023 0923   ALBUMIN 4.6 06/23/2023 0923   AST 22 06/23/2023 0923   ALT 27 06/23/2023 0923   ALKPHOS 58 06/23/2023 0923   BILITOT 0.8 06/23/2023 0923   BILIDIR 0.2 12/07/2022 0933      Component Value Date/Time   TSH 0.78 04/10/2023 1034   Nutritional Lab Results  Component Value Date   VD25OH 40.9 03/21/2023    Medications: Outpatient Encounter Medications as of 08/17/2023  Medication Sig   aspirin EC 81 MG tablet Take 81 mg by mouth daily.   atorvastatin (LIPITOR) 10 MG tablet TAKE 1 TABLET BY MOUTH EVERY DAY   Azelastine HCl 137 MCG/SPRAY SOLN INSTILL 1-2 SPRAYS INTO BOTH NOSTRILS 2 TIMES DAILY AS DIRECTED   chlorhexidine (PERIDEX) 0.12 % solution    fluticasone (FLONASE) 50 MCG/ACT nasal spray USE 2 SPRAYS IN BOTH NOSTRILS  DAILY   hydrOXYzine (ATARAX) 10 MG tablet Take 1 tablet (10 mg total) by mouth 3 (three) times daily as needed for anxiety.   lisinopril (ZESTRIL) 10 MG tablet TAKE 1 TABLET BY MOUTH DAILY   psyllium (REGULOID) 0.52 g capsule Take 0.52 g by mouth daily.   sertraline (ZOLOFT) 100 MG tablet Take 1 tablet (100 mg total) by mouth daily.   terazosin (HYTRIN) 1 MG capsule TAKE 1 CAPSULE BY MOUTH AT  BEDTIME   terbinafine (LAMISIL) 250 MG tablet Take 1 tablet (250 mg total) by mouth daily.   [DISCONTINUED] tirzepatide (ZEPBOUND) 5 MG/0.5ML Pen Inject 5 mg into the skin once a week.   tirzepatide (ZEPBOUND) 7.5 MG/0.5ML Pen Inject 7.5 mg into the skin once a week.   No facility-administered encounter medications on file as of 08/17/2023.     Follow-Up   No follow-ups on file.Aaron Aas Steven Sloan was informed of the importance of frequent follow up visits to maximize his success with intensive lifestyle modifications for his multiple health conditions.  Attestation Statement   Reviewed by clinician on day of visit: allergies, medications, problem list, medical history, surgical history, family history, social history, and previous encounter notes.      Steven Picker, MD

## 2023-08-19 ENCOUNTER — Other Ambulatory Visit: Payer: Self-pay | Admitting: Family Medicine

## 2023-08-24 ENCOUNTER — Encounter: Payer: Self-pay | Admitting: Adult Health

## 2023-08-24 ENCOUNTER — Ambulatory Visit: Admitting: Adult Health

## 2023-08-24 VITALS — BP 117/73 | HR 61 | Ht 69.0 in | Wt 242.6 lb

## 2023-08-24 DIAGNOSIS — Z6836 Body mass index (BMI) 36.0-36.9, adult: Secondary | ICD-10-CM

## 2023-08-24 DIAGNOSIS — J439 Emphysema, unspecified: Secondary | ICD-10-CM | POA: Diagnosis not present

## 2023-08-24 DIAGNOSIS — G4733 Obstructive sleep apnea (adult) (pediatric): Secondary | ICD-10-CM | POA: Diagnosis not present

## 2023-08-24 DIAGNOSIS — E66812 Obesity, class 2: Secondary | ICD-10-CM | POA: Diagnosis not present

## 2023-08-24 NOTE — Progress Notes (Signed)
 @Patient  ID: Steven Sloan, male    DOB: [birth year], 63 y.o.   MRN: [id number]  Chief Complaint  Patient presents with   Obstructive Sleep Apnea    Cpap f/u    Referring provider: Benjiman Bras, MD  HPI: 63 yo male former smoker obstructive sleep apnea and Mild emphysema  Participates in the lung cancer CT chest screening program Medical history significant for aortic valve stenosis followed by cardiology   TEST/EVENTS :  2007 PSG AHI 40-60/hr    PFTs 05/2022 normal lung function with FEV1 at 99%, ratio 78, FVC 96%, DLCO 102%.   CT chest 08/2022 - Lung RADS 1 , mild emphysema   08/24/2023 Follow up : OSA and Mild Emphysema  Patient presents for a follow-up visit.  Last seen January 2024.  Patient has obstructive sleep apnea.  He is on CPAP at bedtime.  Patient states he wears CPAP every single night.  Feels that he benefits from CPAP with decreased daytime sleepiness.  CPAP download shows 100% compliance.  Daily average usage 8 hours.  Patient is on auto CPAP 10 to 14 cm H2O.  AHI 0.8/hour, daily average pressure 12cmH2o. DME is healthy at home .  When ready for new CPAP wants to change to local DME. Uses nasal pillows.   Patient is a former smoker.  Participates in the lung cancer CT chest screening program.  Last CT chest April 2024 with no suspicious nodules.  It showed mild emphysema.  PFTs 2024 were normal.  Patient has no significant shortness of breath.  No cough.  Not on any inhalers.  Recently started on Zepbound  for weight loss with OSA.  Tolerating well. Down 12lbs . Feels good     No Known Allergies  Immunization History  Administered Date(s) Administered   Influenza, Seasonal, Injecte, Preservative Fre 02/04/2023   Influenza,inj,Quad PF,6+ Mos 06/10/2015, 01/22/2016, 01/30/2017, 02/16/2018, 12/26/2018, 01/27/2020, 02/03/2021, 02/05/2021   Influenza-Unspecified 01/30/2014   PFIZER Comirnaty(Gray Top)Covid-19 Tri-Sucrose Vaccine 07/31/2020   PFIZER(Purple  Top)SARS-COV-2 Vaccination 07/23/2019, 08/13/2019, 02/14/2020, 02/05/2021   Pfizer Covid-19 Vaccine Bivalent Booster 51yrs & up 02/05/2021, 03/10/2022   Pfizer(Comirnaty)Fall Seasonal Vaccine 12 years and older 02/04/2023   Pneumococcal Conjugate-13 05/20/2021   Pneumococcal Polysaccharide-23 04/04/2019   Td 05/02/2006   Tdap 05/31/2016   Zoster Recombinant(Shingrix ) 01/11/2019, 03/25/2019    Past Medical History:  Diagnosis Date   Allergy    Seasonal   Anxiety    Aortic valve stenosis 08/10/2022   severe   Chicken pox    Heart murmur    Asymptomatic -- Patient endorses previous negative evaluation   High cholesterol    Hypertension    Sleep apnea    CPAP nightly    Tobacco History: Social History   Tobacco Use  Smoking Status Former   Current packs/day: 0.00   Average packs/day: 0.3 packs/day for 25.0 years (6.3 ttl pk-yrs)   Types: Cigarettes   Start date: 05/17/1990   Quit date: 05/18/2015   Years since quitting: 8.2  Smokeless Tobacco Never   Counseling given: Not Answered   Outpatient Medications Prior to Visit  Medication Sig Dispense Refill   aspirin EC 81 MG tablet Take 81 mg by mouth daily.     atorvastatin  (LIPITOR) 10 MG tablet TAKE 1 TABLET BY MOUTH EVERY DAY 90 tablet 1   Azelastine  HCl 137 MCG/SPRAY SOLN INSTILL 1-2 SPRAYS INTO BOTH NOSTRILS 2 TIMES DAILY AS DIRECTED 30 mL 1   chlorhexidine (PERIDEX) 0.12 % solution  fluticasone  (FLONASE ) 50 MCG/ACT nasal spray USE 2 SPRAYS IN BOTH NOSTRILS  DAILY 48 g 11   hydrOXYzine  (ATARAX ) 10 MG tablet Take 1 tablet (10 mg total) by mouth 3 (three) times daily as needed for anxiety. 30 tablet 5   lisinopril  (ZESTRIL ) 10 MG tablet TAKE 1 TABLET BY MOUTH DAILY 90 tablet 3   Multiple Vitamins-Minerals (MULTIVITAMIN WITH MINERALS) tablet Take 1 tablet by mouth daily.     psyllium (REGULOID) 0.52 g capsule Take 0.52 g by mouth daily.     sertraline  (ZOLOFT ) 100 MG tablet Take 1 tablet (100 mg total) by mouth daily.  30 tablet 3   terazosin  (HYTRIN ) 1 MG capsule TAKE 1 CAPSULE BY MOUTH AT  BEDTIME 90 capsule 3   terbinafine  (LAMISIL ) 250 MG tablet Take 1 tablet (250 mg total) by mouth daily. 30 tablet 2   tirzepatide  (ZEPBOUND ) 7.5 MG/0.5ML Pen Inject 7.5 mg into the skin once a week. 2 mL 0   No facility-administered medications prior to visit.     Review of Systems:   Constitutional:   No  weight loss, night sweats,  Fevers, chills, fatigue, or  lassitude.  HEENT:   No headaches,  Difficulty swallowing,  Tooth/dental problems, or  Sore throat,                No sneezing, itching, ear ache, nasal congestion, post nasal drip,   CV:  No chest pain,  Orthopnea, PND, swelling in lower extremities, anasarca, dizziness, palpitations, syncope.   GI  No heartburn, indigestion, abdominal pain, nausea, vomiting, diarrhea, change in bowel habits, loss of appetite, bloody stools.   Resp: No shortness of breath with exertion or at rest.  No excess mucus, no productive cough,  No non-productive cough,  No coughing up of blood.  No change in color of mucus.  No wheezing.  No chest wall deformity  Skin: no rash or lesions.  GU: no dysuria, change in color of urine, no urgency or frequency.  No flank pain, no hematuria   MS:  No joint pain or swelling.  No decreased range of motion.  No back pain.    Physical Exam  BP 117/73 (BP Location: Left Arm, Patient Position: Sitting, Cuff Size: Large)   Pulse 61   Ht 5\' 9"  (1.753 m)   Wt 242 lb 9.6 oz (110 kg)   SpO2 95%   BMI 35.83 kg/m   GEN: A/Ox3; pleasant , NAD, well nourished    HEENT:  [place]/AT, NOSE-clear, THROAT-clear, no lesions, no postnasal drip or exudate noted.   NECK:  Supple w/ fair ROM; no JVD; normal carotid impulses w/o bruits; no thyromegaly or nodules palpated; no lymphadenopathy.    RESP  Clear  P & A; w/o, wheezes/ rales/ or rhonchi. no accessory muscle use, no dullness to percussion  CARD:  RRR, no m/r/g, no peripheral edema, pulses  intact, no cyanosis or clubbing.  GI:   Soft & nt; nml bowel sounds; no organomegaly or masses detected.   Musco: Warm bil, no deformities or joint swelling noted.   Neuro: alert, no focal deficits noted.    Skin: Warm, no lesions or rashes    Lab Results:  CBC    Component Value Date/Time   WBC 6.4 04/10/2023 1034   RBC 5.11 04/10/2023 1034   HGB 15.9 04/10/2023 1034   HCT 46.9 04/10/2023 1034   PLT 224.0 04/10/2023 1034   MCV 91.8 04/10/2023 1034   MCH 31.8 04/09/2015 1140  MCHC 34.0 04/10/2023 1034   RDW 12.9 04/10/2023 1034   LYMPHSABS 1.5 01/11/2019 0858   MONOABS 0.7 01/11/2019 0858   EOSABS 0.4 01/11/2019 0858   BASOSABS 0.0 01/11/2019 0858    BMET    Component Value Date/Time   NA 136 06/23/2023 0923   K 4.0 06/23/2023 0923   CL 102 06/23/2023 0923   CO2 29 06/23/2023 0923   GLUCOSE 99 06/23/2023 0923   BUN 25 (H) 06/23/2023 0923   CREATININE 1.44 06/23/2023 0923   CALCIUM  9.7 06/23/2023 0923   GFRNONAA >60 04/09/2015 1140   GFRAA >60 04/09/2015 1140    BNP No results found for: "BNP"  ProBNP No results found for: "PROBNP"  Imaging: No results found.  Administration History     None          Latest Ref Rng & Units 05/17/2022    2:57 PM  PFT Results  FVC-Pre L 4.52   FVC-Predicted Pre % 97   FVC-Post L 4.48   FVC-Predicted Post % 96   Pre FEV1/FVC % % 75   Post FEV1/FCV % % 78   FEV1-Pre L 3.41   FEV1-Predicted Pre % 96   FEV1-Post L 3.50   DLCO uncorrected ml/min/mmHg 27.55   DLCO UNC% % 102   DLCO corrected ml/min/mmHg 27.55   DLCO COR %Predicted % 102   DLVA Predicted % 100   TLC L 7.48   TLC % Predicted % 110   RV % Predicted % 134     No results found for: "NITRICOXIDE"      Assessment & Plan:   OSA (obstructive sleep apnea) Excellent control compliance on nocturnal CPAP.  Continue on current settings.  Patient education given on CPAP care  Plan  Patient Instructions  Keep up the good work Continue on CPAP  at bedtime Work on healthy weight Do not drive if  sleepy Continue with Lung cancer CT screening program.  Follow-up in 1 year with Dr. Gaynell Keeler or Marca Gadsby NP       Class 2 severe obesity with serious comorbidity and body mass index (BMI) of 36.0 to 36.9 in adult [facility]) Patient is doing very well with weight loss.  Continue on weight loss regimen per primary care.  Healthy weight loss encouraged  Emphysema lung (HCC) Mild emphysema on CT scan.  PFTs normal .  No significant symptoms.     Roena Clark, NP 08/24/2023

## 2023-08-24 NOTE — Patient Instructions (Addendum)
 Keep up the good work Continue on CPAP at bedtime Work on healthy weight Do not drive if  sleepy Continue with Lung cancer CT screening program.  Follow-up in 1 year with Dr. Gaynell Keeler or French Jester NP

## 2023-08-24 NOTE — Assessment & Plan Note (Signed)
 Mild emphysema on CT scan.  PFTs normal .  No significant symptoms.

## 2023-08-24 NOTE — Assessment & Plan Note (Signed)
 Excellent control compliance on nocturnal CPAP.  Continue on current settings.  Patient education given on CPAP care  Plan  Patient Instructions  Keep up the good work Continue on CPAP at bedtime Work on healthy weight Do not drive if  sleepy Continue with Lung cancer CT screening program.  Follow-up in 1 year with Dr. Gaynell Keeler or French Jester NP

## 2023-08-24 NOTE — Assessment & Plan Note (Addendum)
 Patient is doing very well with weight loss.  Continue on weight loss regimen per primary care.  Healthy weight loss encouraged

## 2023-08-28 ENCOUNTER — Ambulatory Visit (HOSPITAL_BASED_OUTPATIENT_CLINIC_OR_DEPARTMENT_OTHER)
Admission: RE | Admit: 2023-08-28 | Discharge: 2023-08-28 | Disposition: A | Discharge status: Home / Self Care | Source: Ambulatory Visit | Attending: Acute Care | Admitting: Acute Care

## 2023-08-28 DIAGNOSIS — Z87891 Personal history of nicotine dependence: Secondary | ICD-10-CM | POA: Insufficient documentation

## 2023-08-28 DIAGNOSIS — I700 Atherosclerosis of aorta: Secondary | ICD-10-CM | POA: Insufficient documentation

## 2023-08-28 DIAGNOSIS — Z122 Encounter for screening for malignant neoplasm of respiratory organs: Secondary | ICD-10-CM | POA: Diagnosis present

## 2023-08-28 DIAGNOSIS — J439 Emphysema, unspecified: Secondary | ICD-10-CM | POA: Diagnosis not present

## 2023-08-30 ENCOUNTER — Encounter: Payer: Self-pay | Admitting: Behavioral Health

## 2023-08-30 ENCOUNTER — Ambulatory Visit (INDEPENDENT_AMBULATORY_CARE_PROVIDER_SITE_OTHER): Admitting: Behavioral Health

## 2023-08-30 DIAGNOSIS — F411 Generalized anxiety disorder: Secondary | ICD-10-CM | POA: Diagnosis not present

## 2023-08-30 DIAGNOSIS — F4323 Adjustment disorder with mixed anxiety and depressed mood: Secondary | ICD-10-CM

## 2023-08-30 NOTE — Progress Notes (Signed)
 [facility] Counselor/Therapist Progress Note  Patient ID: Steven Sloan, MRN: [id number],    Date: 08/30/2023  Time Spent: 60 minutes, 11:05 AM until 12:05 PM .This session was held via video teletherapy. The patient consented to the video teletherapy and was located in his home office during this session. He is aware it is the responsibility of the patient to secure confidentiality on his end of the session. The provider was in a private home office for the duration of this session.    Treatment Type: Individual Therapy  Reported Symptoms: Anxiety, stress, depression  Mental Status Exam: Appearance:  Well Groomed     Behavior: Appropriate  Motor: Normal  Speech/Language:  Normal Rate  Affect: Appropriate  Mood: normal  Thought process: normal  Thought content:   WNL  Sensory/Perceptual disturbances:   WNL  Orientation: oriented to person, place, time/date, situation, day of week, month of year, and year  Attention: Good  Concentration: Good  Memory: WNL  Fund of knowledge:  Good  Insight:   Good  Judgment:  Good  Impulse Control: Good   Risk Assessment: Danger to Self:  No Self-injurious Behavior: No Danger to Others: No Duty to Warn:no Physical Aggression / Violence:No  Access to Firearms a concern: No  Gang Involvement:No   Subjective: The patient enjoyed his stay occasional week but said Monday morning after that week he almost was sick to his stomach thinking about getting back into the busyness.  He says he feels overwhelmed.  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  He feels that would go okay if he did not have to immediately walk into having so much to do at home.  His daughter's ADD and when it is at its worst the level of distractibility is so high she is not helpful and she does not do a lot when she is not like that.  His wife has progressed physically from her medical health issues and it  is helping a little bit more but there is still a substantial amount to do.  When he ask her to do more he says she claims that he has a "resting bitch face" and is mean to her and she shuts down a lot do anything else.  We talked about some different ways he could handle at because she claims that he is a perfectionist.  He says that he just likes to optimize most situations and looks that he is individually.  He is the dishwasher as an example and encouraged him to let her loaded the way that she wanted to and is step away and do something else to see what kind of response that he gets from her.  He is getting ready to do to very significant projects in the home because has gotten to the point where he feels out of control to him and we talked about how to compartmentalize that an attempt to get some by and from his wife and daughter.  Encouraged continued use of his coping skills as the stress level has been up.  He reports that physically there had not been any deterioration or increase in symptoms and he is thankful for that.  He does contract for safety having no thoughts of hurting himself or anyone else.  Interventions: Cognitive Behavioral Therapy and Dialectical Behavioral Therapy Diagnosis:Generalized anxiety disorder  Plan: I will meet with the patient every 2 to 3 weeks. Treatment plan: Goals are  to reduce the patient's anxiety and some mild depression primarily related to medical condition but also other family factors by at least 50% with a target date of July 31, 2023.  Goals for minimizing depression are for the patient to have less sadness as indicated by his report and PHQ-9 scores, have improved mood and return to a healthier level of functioning.  We will look at any other causes for depression in addition to those stated above.  We will explore how depression is experienced by him, use cognitive behavioral therapy to explore and replace unhealthy thought and behavior patterns  contributing to depression as well as encouraging the sharing of feelings related to causes and symptoms of depression.  We will introduce coping skills for managing depressive symptoms.  Goals for reducing anxiety include improving his ability to better manage stress and anxiety symptoms, identify causes for anxiety and introduce ways to lower it including resolving core conflicts contributing to the anxiety.  Finally a goal for the patient will be to manage thoughts and worrisome thinking contributing to feelings of anxiety.  Interventions include providing education about anxiety to help him understand his causes and triggers, facilitate problem solution skills and coping skills for managing anxiety.  We will also use cognitive behavioral therapy to identify and change anxiety provoking thought and behavior patterns as well as dialectical behavior therapy to introduce distress tolerance and mindfulness skills. I reviewed the treatment goals as listed above with patient's agreement and extended the target date to January 30, 2024. Progress: 30%  Cecile Coder, [facility]                                Cecile Coder, [facility]               Cecile Coder, [facility]

## 2023-09-05 ENCOUNTER — Encounter (INDEPENDENT_AMBULATORY_CARE_PROVIDER_SITE_OTHER): Payer: Self-pay | Admitting: Internal Medicine

## 2023-09-05 ENCOUNTER — Ambulatory Visit (INDEPENDENT_AMBULATORY_CARE_PROVIDER_SITE_OTHER): Admitting: Internal Medicine

## 2023-09-05 DIAGNOSIS — E66812 Obesity, class 2: Secondary | ICD-10-CM

## 2023-09-05 DIAGNOSIS — G4733 Obstructive sleep apnea (adult) (pediatric): Secondary | ICD-10-CM | POA: Diagnosis not present

## 2023-09-05 DIAGNOSIS — Z6834 Body mass index (BMI) 34.0-34.9, adult: Secondary | ICD-10-CM

## 2023-09-05 DIAGNOSIS — R7303 Prediabetes: Secondary | ICD-10-CM | POA: Diagnosis not present

## 2023-09-05 NOTE — Assessment & Plan Note (Signed)
 He has experienced weight fluctuations, with a recent gain of one pound attributed to vacation and increased snack availability. He follows an 1800 calorie meal plan and exercises regularly. The importance of consistent dietary habits  discussed. Long-term lifestyle changes are necessary to maintain weight loss post-medication.  - Start Zepbound  7.5 mg weekly. - Reassess in four weeks to determine if dosage should be increased to 10 mg. - Encourage adherence to 1800 calorie meal plan with 120 grams of protein daily. - Increase water intake to aid in satiety. - Reinforce the importance of consistent dietary habits and exercise. -  demonstrate use of MyNet Diary app for tracking nutrition. - Encourage tracking food intake three to four days a week.

## 2023-09-05 NOTE — Assessment & Plan Note (Signed)
 He has had 2 consecutive fasting blood sugars above 100 with normal range hemoglobin A1c and mildly elevated insulin  levels consistent with mild insulin  resistance.   He is now on Zepbound  which will be increased to 7.5 mg once a week for pharmacoprophylaxis. he will continue to work on implementation of reduced calorie nutrition plan we discussed tracking and journaling.  He has been educated on the carb insulin  model for obesity and is aware the importance of maintaining a diet with a low glycemic load.

## 2023-09-05 NOTE — Assessment & Plan Note (Signed)
 Severe OSA per history.  On CPAP with reported good compliance. Continue PAP therapy.  Losing 15% of body weight may reduce AHI.  Now on Zepbound  will be increasing to 7.5 mg once a week.

## 2023-09-05 NOTE — Progress Notes (Signed)
 Office: [phone]  /  Fax: [phone]  Weight Summary And Biometrics  Vitals Temp: 97.9 F (36.6 C) BP: 123/78 Pulse Rate: 68 SpO2: 97 %   Anthropometric Measurements Height: 5\' 9"  (1.753 m) Weight: 237 lb (107.5 kg) BMI (Calculated): 34.98 Weight at Last Visit: 236 lb Weight Lost Since Last Visit: 0 lb Weight Gained Since Last Visit: 1 lb Starting Weight: 247 lb Total Weight Loss (lbs): 10 lb (4.536 kg) Peak Weight: 285 lb   Body Composition  Body Fat %: 29.8 % Fat Mass (lbs): 70.8 lbs Muscle Mass (lbs): 158.6 lbs Total Body Water (lbs): 112.4 lbs Visceral Fat Rating : 18    RMR: 2282  Today's Visit #: 6  Starting Date: 03/21/23   Subjective   Chief Complaint: Obesity  Interval History Discussed the use of AI scribe software for clinical note transcription with the patient, who gave verbal consent to proceed.  History of Present Illness   Steven Linsey "[name]" is a 63 year old male who presents for medical weight management.  He is currently on Zepbound  7.5 mg once a week for weight management, lisinopril  for blood pressure, and atorvastatin  for cholesterol. He has gained one pound since his last visit. He follows an 1800 calorie meal plan about 90% of the time, tracks calories, eats more whole foods, and gets the recommended amount of protein. He does not skip meals. He exercises five days a week for about 60 minutes each session.  In terms of weight trajectory, he had previously lost six pounds from March to April and an additional five pounds from April to the last visit. However, he has gained one pound since then, which he attributes to being on vacation and having snacks that he does not normally keep at home due to a blood drive he sponsored.  The medication helps with 'food noise' in the afternoon, reducing his desire to snack during that time. However, he still struggles with late-night snacking, especially when bored or preparing for bed. He  has not yet started the 7.5 mg dose of Zepbound , having completed four doses of 5 mg, with the first 7.5 mg dose scheduled for the upcoming Saturday.  He acknowledges that he has been off routine, particularly during the past three weeks, and aims to increase his water intake to help with satiety.       Challenges affecting patient progress: exposure to enticing environments and/or relationships and recent lapse in weight management plan due to work, travel or family circumstances.    Pharmacotherapy for weight management: He is currently taking Zepbound  with adequate clinical response  and without side effects..   Assessment and Plan   Treatment Plan For Obesity:  Recommended Dietary Goals  Steven Sloan is currently in the action stage of change. As such, his goal is to continue weight management plan. He has agreed to: keep a food journal with a target of  1800 calories per day and 90-120 grams of protein per day or 30-40 grams per meal.  Behavioral Health and Counseling  We discussed the following behavioral modification strategies today: work on tracking and journaling calories using tracking application, keeping healthy foods at home, continue to work on implementation of reduced calorie nutritional plan, and staying on track while traveling and vacationing.  Additional education and resources provided today: Handout and personalized instruction on tracking and journaling using Apps  Recommended Physical Activity Goals  Steven Sloan has been advised to work up to 150 minutes of moderate intensity aerobic activity a  week and strengthening exercises 2-3 times per week for cardiovascular health, weight loss maintenance and preservation of muscle mass.   He has agreed to :  continue to gradually increase the amount and intensity of exercise routine  Pharmacotherapy  We discussed various medication options to help Steven Sloan with his weight loss efforts and we both agreed to :  He will be starting  Zepbound  7.5 mg once a week  Associated Conditions Impacted by Obesity Treatment  Class 2 severe obesity with serious comorbidity and body mass index (BMI) of 36.0 to 36.9 in adult, unspecified obesity type [facility]) Assessment & Plan: He has experienced weight fluctuations, with a recent gain of one pound attributed to vacation and increased snack availability. He follows an 1800 calorie meal plan and exercises regularly. The importance of consistent dietary habits  discussed. Long-term lifestyle changes are necessary to maintain weight loss post-medication.  - Start Zepbound  7.5 mg weekly. - Reassess in four weeks to determine if dosage should be increased to 10 mg. - Encourage adherence to 1800 calorie meal plan with 120 grams of protein daily. - Increase water intake to aid in satiety. - Reinforce the importance of consistent dietary habits and exercise. -  demonstrate use of MyNet Diary app for tracking nutrition. - Encourage tracking food intake three to four days a week.   OSA (obstructive sleep apnea) Assessment & Plan: Severe OSA per history.  On CPAP with reported good compliance. Continue PAP therapy.  Losing 15% of body weight may reduce AHI.  Now on Zepbound  will be increasing to 7.5 mg once a week.   Prediabetes Assessment & Plan: He has had 2 consecutive fasting blood sugars above 100 with normal range hemoglobin A1c and mildly elevated insulin  levels consistent with mild insulin  resistance.   He is now on Zepbound  which will be increased to 7.5 mg once a week for pharmacoprophylaxis. he will continue to work on implementation of reduced calorie nutrition plan we discussed tracking and journaling.  He has been educated on the carb insulin  model for obesity and is aware the importance of maintaining a diet with a low glycemic load.            Objective   Physical Exam:  Blood pressure 123/78, pulse 68, temperature 97.9 F (36.6 C), height 5\' 9"  (1.753 m), weight 237  lb (107.5 kg), SpO2 97%. Body mass index is 35 kg/m.  General: He is overweight, cooperative, alert, well developed, and in no acute distress. PSYCH: Has normal mood, affect and thought process.   HEENT: EOMI, sclerae are anicteric. Lungs: Normal breathing effort, no conversational dyspnea. Extremities: No edema.  Neurologic: No gross sensory or motor deficits. No tremors or fasciculations noted.    Diagnostic Data Reviewed:  BMET    Component Value Date/Time   NA 136 06/23/2023 0923   K 4.0 06/23/2023 0923   CL 102 06/23/2023 0923   CO2 29 06/23/2023 0923   GLUCOSE 99 06/23/2023 0923   BUN 25 (H) 06/23/2023 0923   CREATININE 1.44 06/23/2023 0923   CALCIUM  9.7 06/23/2023 0923   GFRNONAA >60 04/09/2015 1140   GFRAA >60 04/09/2015 1140   Lab Results  Component Value Date   HGBA1C 5.3 03/21/2023   HGBA1C 5.0 07/22/2015   Lab Results  Component Value Date   INSULIN  12.6 03/21/2023   Lab Results  Component Value Date   TSH 0.78 04/10/2023   CBC    Component Value Date/Time   WBC 6.4 04/10/2023 1034  RBC 5.11 04/10/2023 1034   HGB 15.9 04/10/2023 1034   HCT 46.9 04/10/2023 1034   PLT 224.0 04/10/2023 1034   MCV 91.8 04/10/2023 1034   MCH 31.8 04/09/2015 1140   MCHC 34.0 04/10/2023 1034   RDW 12.9 04/10/2023 1034   Iron Studies No results found for: "IRON", "TIBC", "FERRITIN", "IRONPCTSAT" Lipid Panel     Component Value Date/Time   CHOL 115 04/10/2023 1034   TRIG 78.0 04/10/2023 1034   HDL 45.40 04/10/2023 1034   CHOLHDL 3 04/10/2023 1034   VLDL 15.6 04/10/2023 1034   LDLCALC 54 04/10/2023 1034   Hepatic Function Panel     Component Value Date/Time   PROT 7.9 06/23/2023 0923   ALBUMIN 4.6 06/23/2023 0923   AST 22 06/23/2023 0923   ALT 27 06/23/2023 0923   ALKPHOS 58 06/23/2023 0923   BILITOT 0.8 06/23/2023 0923   BILIDIR 0.2 12/07/2022 0933      Component Value Date/Time   TSH 0.78 04/10/2023 1034   Nutritional Lab Results  Component Value  Date   VD25OH 40.9 03/21/2023    Medications: Outpatient Encounter Medications as of 09/05/2023  Medication Sig   aspirin EC 81 MG tablet Take 81 mg by mouth daily.   atorvastatin  (LIPITOR) 10 MG tablet TAKE 1 TABLET BY MOUTH EVERY DAY   Azelastine  HCl 137 MCG/SPRAY SOLN INSTILL 1-2 SPRAYS INTO BOTH NOSTRILS 2 TIMES DAILY AS DIRECTED   chlorhexidine (PERIDEX) 0.12 % solution    fluticasone  (FLONASE ) 50 MCG/ACT nasal spray USE 2 SPRAYS IN BOTH NOSTRILS  DAILY   hydrOXYzine  (ATARAX ) 10 MG tablet Take 1 tablet (10 mg total) by mouth 3 (three) times daily as needed for anxiety.   lisinopril  (ZESTRIL ) 10 MG tablet TAKE 1 TABLET BY MOUTH DAILY   Multiple Vitamins-Minerals (MULTIVITAMIN WITH MINERALS) tablet Take 1 tablet by mouth daily.   psyllium (REGULOID) 0.52 g capsule Take 0.52 g by mouth daily.   sertraline  (ZOLOFT ) 100 MG tablet Take 1 tablet (100 mg total) by mouth daily.   terazosin  (HYTRIN ) 1 MG capsule TAKE 1 CAPSULE BY MOUTH AT  BEDTIME   terbinafine  (LAMISIL ) 250 MG tablet Take 1 tablet (250 mg total) by mouth daily.   tirzepatide  (ZEPBOUND ) 7.5 MG/0.5ML Pen Inject 7.5 mg into the skin once a week.   No facility-administered encounter medications on file as of 09/05/2023.     Follow-Up   Return in about 4 weeks (around 10/03/2023) for For Weight Mangement with Dr. Allie Area.Aaron Aas He was informed of the importance of frequent follow up visits to maximize his success with intensive lifestyle modifications for his multiple health conditions.  Attestation Statement   Reviewed by clinician on day of visit: allergies, medications, problem list, medical history, surgical history, family history, social history, and previous encounter notes.     Ladd Picker, MD

## 2023-09-13 ENCOUNTER — Ambulatory Visit (INDEPENDENT_AMBULATORY_CARE_PROVIDER_SITE_OTHER): Admitting: Behavioral Health

## 2023-09-13 ENCOUNTER — Encounter: Payer: Self-pay | Admitting: Behavioral Health

## 2023-09-13 DIAGNOSIS — F4323 Adjustment disorder with mixed anxiety and depressed mood: Secondary | ICD-10-CM | POA: Diagnosis not present

## 2023-09-13 NOTE — Progress Notes (Signed)
 [facility] Counselor/Therapist Progress Note  Patient ID: Steven Sloan, MRN: [id number],    Date: 09/13/2023  Time Spent: 60 minutes, 11:02 AM until 12:02 PM .This session was held via video teletherapy. The patient consented to the video teletherapy and was located in his home office during this session. He is aware it is the responsibility of the patient to secure confidentiality on his end of the session. The provider was in a private home office for the duration of this session.    Treatment Type: Individual Therapy  Reported Symptoms: Anxiety, stress, depression  Mental Status Exam: Appearance:  Well Groomed     Behavior: Appropriate  Motor: Normal  Speech/Language:  Normal Rate  Affect: Appropriate  Mood: normal  Thought process: normal  Thought content:   WNL  Sensory/Perceptual disturbances:   WNL  Orientation: oriented to person, place, time/date, situation, day of week, month of year, and year  Attention: Good  Concentration: Good  Memory: WNL  Fund of knowledge:  Good  Insight:   Good  Judgment:  Good  Impulse Control: Good   Risk Assessment: Danger to Self:  No Self-injurious Behavior: No Danger to Others: No Duty to Warn:no Physical Aggression / Violence:No  Access to Firearms a concern: No  Gang Involvement:No   Subjective: Patient reported that Mother's Day was a few siblings.  He acknowledges that he is already extremely busy at work and take care of things at home.  His wife took her mother for procedure and in the middle of the procedure started feeling bad and found out that she had 2 kidney stones so on the same day he had to look at his mother-in-law for her procedure and also take care of his wife.  She is scheduled for removing those kidney stones in a couple of days.  He sees the fact that his 2 adult siblings and their choices in their lifestyles are having on his parents.  He reports that both of his siblings have mental health  issues and his sister has substance abuse issues and he is troubled by the fact that are now both moving back in with his 27 year old parents.  He is helping his parents as much as he can but knows that there is not much that can be done for his siblings that he has not done in the past and is trying to set healthy boundaries with them while at the same time taking care of his parents.  He acknowledges that very stressful but is doing all that he can keep himself calm so that he can care for his own physical health. He does contract for safety having no thoughts of hurting himself or anyone else.  Interventions: Cognitive Behavioral Therapy and Dialectical Behavioral Therapy Diagnosis:Generalized anxiety disorder  Plan: I will meet with the patient every 2 to 3 weeks. Treatment plan: Goals are to reduce the patient's anxiety and some mild depression primarily related to medical condition but also other family factors by at least 50% with a target date of July 31, 2023.  Goals for minimizing depression are for the patient to have less sadness as indicated by his report and PHQ-9 scores, have improved mood and return to a healthier level of functioning.  We will look at any other causes for depression in addition to those stated above.  We will explore how depression is experienced by him, use cognitive behavioral therapy to explore and replace unhealthy thought and behavior patterns contributing to depression as well as  encouraging the sharing of feelings related to causes and symptoms of depression.  We will introduce coping skills for managing depressive symptoms.  Goals for reducing anxiety include improving his ability to better manage stress and anxiety symptoms, identify causes for anxiety and introduce ways to lower it including resolving core conflicts contributing to the anxiety.  Finally a goal for the patient will be to manage thoughts and worrisome thinking contributing to feelings of anxiety.   Interventions include providing education about anxiety to help him understand his causes and triggers, facilitate problem solution skills and coping skills for managing anxiety.  We will also use cognitive behavioral therapy to identify and change anxiety provoking thought and behavior patterns as well as dialectical behavior therapy to introduce distress tolerance and mindfulness skills. I reviewed the treatment goals as listed above with patient's agreement and extended the target date to January 30, 2024. Progress: 30%  Cecile Coder, [facility]                                Cecile Coder, [facility]               Cecile Coder, [facility]               Cecile Coder, [facility]

## 2023-09-14 ENCOUNTER — Other Ambulatory Visit: Payer: Self-pay | Admitting: Family Medicine

## 2023-09-15 ENCOUNTER — Ambulatory Visit (INDEPENDENT_AMBULATORY_CARE_PROVIDER_SITE_OTHER): Admitting: Podiatrist

## 2023-09-15 DIAGNOSIS — B351 Tinea unguium: Secondary | ICD-10-CM

## 2023-09-15 NOTE — Progress Notes (Signed)
 Patient presents today for the laser treatment # 1. Diagnosed with mycotic nail infection by Dr. Edmundo Gourd most affected are left first -  he is currently on Lamisil -  relates he has been battling this fungus for years and has been on sporanox  and lamisil  multiple times over the years.    All other systems are negative.  Patients nails were filed thin using a sterile burr.    Laser therapy via [facility] therapy system was admininstered to affected toenails left first-  as well as nails 1-5 on both feet .  The Patient tolerated the treatment well. All safety precautions were in place.   Single laser pass was done on non-affected nails.  Pre treatment photos are below.  09/15/23    Follow up in 4 weeks for laser # 2

## 2023-09-20 ENCOUNTER — Other Ambulatory Visit: Payer: Self-pay | Admitting: Acute Care

## 2023-09-20 DIAGNOSIS — Z122 Encounter for screening for malignant neoplasm of respiratory organs: Secondary | ICD-10-CM

## 2023-09-20 DIAGNOSIS — Z87891 Personal history of nicotine dependence: Secondary | ICD-10-CM

## 2023-09-27 ENCOUNTER — Encounter: Payer: Self-pay | Admitting: Behavioral Health

## 2023-09-27 ENCOUNTER — Ambulatory Visit (INDEPENDENT_AMBULATORY_CARE_PROVIDER_SITE_OTHER): Admitting: Behavioral Health

## 2023-09-27 DIAGNOSIS — F4323 Adjustment disorder with mixed anxiety and depressed mood: Secondary | ICD-10-CM

## 2023-09-27 DIAGNOSIS — F411 Generalized anxiety disorder: Secondary | ICD-10-CM

## 2023-09-27 NOTE — Progress Notes (Signed)
 [facility] Counselor/Therapist Progress Note  Patient ID: Steven Sloan, MRN: [id number],    Date: 09/27/2023  Time Spent: 60 minutes, 9 AM until 10 AM.This session was held via video teletherapy. The patient consented to the video teletherapy and was located in his home office during this session. He is aware it is the responsibility of the patient to secure confidentiality on his end of the session. The provider was in a private home office for the duration of this session.    Treatment Type: Individual Therapy  Reported Symptoms: Anxiety, stress, depression  Mental Status Exam: Appearance:  Well Groomed     Behavior: Appropriate  Motor: Normal  Speech/Language:  Normal Rate  Affect: Appropriate  Mood: normal  Thought process: normal  Thought content:   WNL  Sensory/Perceptual disturbances:   WNL  Orientation: oriented to person, place, time/date, situation, day of week, month of year, and year  Attention: Good  Concentration: Good  Memory: WNL  Fund of knowledge:  Good  Insight:   Good  Judgment:  Good  Impulse Control: Good   Risk Assessment: Danger to Self:  No Self-injurious Behavior: No Danger to Others: No Duty to Warn:no Physical Aggression / Violence:No  Access to Firearms a concern: No  Gang Involvement:No   Subjective: The patient has a lot on his plate.  Both he and his daughter were sick a good part of last week and is still trying to recover some energy and does not feel great.  His wife is recovering from having several kidney stones removed and has not been able to help much.  He is let things follow-up such as laundry etc. because he has not had the time or energy to do them.  He asked his mother-in-law to come help give him some guidance on how to put some plans shrubs flowers etc. in the chart and said that she was critical the entire time.  He has hired someone to come help clean up the house especially going through his daughter's room  and is trying to help out with that.  He also is maintaining work.  He feels overwhelmed and I encouraged him to continue to have conversations with his wife and daughter about helping him help.  For the most part physically he is feeling as well as he can say he has not noticed much change in his heart condition.  He is on a weight loss medication which he is hopeful will help some but also created some other issues which she is doing his best to deal with including what to eat how much to eat when to eat etc.  He does continue to practice stress reduction techniques as we have practiced some.  He does contract for safety having no thoughts of hurting himself or anyone else.  Interventions: Cognitive Behavioral Therapy and Dialectical Behavioral Therapy Diagnosis:Generalized anxiety disorder  Plan: I will meet with the patient every 2 to 3 weeks. Treatment plan: Goals are to reduce the patient's anxiety and some mild depression primarily related to medical condition but also other family factors by at least 50% with a target date of July 31, 2023.  Goals for minimizing depression are for the patient to have less sadness as indicated by his report and PHQ-9 scores, have improved mood and return to a healthier level of functioning.  We will look at any other causes for depression in addition to those stated above.  We will explore how depression is experienced by him, use  cognitive behavioral therapy to explore and replace unhealthy thought and behavior patterns contributing to depression as well as encouraging the sharing of feelings related to causes and symptoms of depression.  We will introduce coping skills for managing depressive symptoms.  Goals for reducing anxiety include improving his ability to better manage stress and anxiety symptoms, identify causes for anxiety and introduce ways to lower it including resolving core conflicts contributing to the anxiety.  Finally a goal for the patient will  be to manage thoughts and worrisome thinking contributing to feelings of anxiety.  Interventions include providing education about anxiety to help him understand his causes and triggers, facilitate problem solution skills and coping skills for managing anxiety.  We will also use cognitive behavioral therapy to identify and change anxiety provoking thought and behavior patterns as well as dialectical behavior therapy to introduce distress tolerance and mindfulness skills. I reviewed the treatment goals as listed above with patient's agreement and extended the target date to January 30, 2024. Progress: 30%  Cecile Coder, [facility]                                Cecile Coder, [facility]               Cecile Coder, [facility]               Cecile Coder, [facility]               Cecile Coder, [facility]

## 2023-10-02 ENCOUNTER — Encounter (INDEPENDENT_AMBULATORY_CARE_PROVIDER_SITE_OTHER): Payer: Self-pay | Admitting: Internal Medicine

## 2023-10-03 ENCOUNTER — Other Ambulatory Visit (INDEPENDENT_AMBULATORY_CARE_PROVIDER_SITE_OTHER): Payer: Self-pay | Admitting: Internal Medicine

## 2023-10-03 DIAGNOSIS — E66812 Obesity, class 2: Secondary | ICD-10-CM

## 2023-10-03 DIAGNOSIS — R7303 Prediabetes: Secondary | ICD-10-CM

## 2023-10-03 DIAGNOSIS — G4733 Obstructive sleep apnea (adult) (pediatric): Secondary | ICD-10-CM

## 2023-10-03 MED ORDER — TIRZEPATIDE-WEIGHT MANAGEMENT 10 MG/0.5ML ~~LOC~~ SOAJ: 10.0000 mg | SUBCUTANEOUS | 0 refills | Status: DC

## 2023-10-11 ENCOUNTER — Encounter (INDEPENDENT_AMBULATORY_CARE_PROVIDER_SITE_OTHER): Payer: Self-pay | Admitting: Internal Medicine

## 2023-10-11 NOTE — Telephone Encounter (Signed)
**Note De-identified  [place] Obfuscation** Please advise 

## 2023-10-12 ENCOUNTER — Ambulatory Visit: Admitting: Behavioral Health

## 2023-10-12 ENCOUNTER — Encounter: Payer: Self-pay | Admitting: Behavioral Health

## 2023-10-12 DIAGNOSIS — F4323 Adjustment disorder with mixed anxiety and depressed mood: Secondary | ICD-10-CM

## 2023-10-12 NOTE — Telephone Encounter (Signed)
 Left message to call back

## 2023-10-12 NOTE — Progress Notes (Signed)
 [facility] Counselor/Therapist Progress Note  Patient ID: Steven Sloan, MRN: [id number],    Date: 10/12/2023  Time Spent: 2 PM until 2:58 PM, 58 minutes.This session was held via video teletherapy. The patient consented to the video teletherapy and was located in his home office during this session. He is aware it is the responsibility of the patient to secure confidentiality on his end of the session. The provider was in a private home office for the duration of this session.    Treatment Type: Individual Therapy  Reported Symptoms: Anxiety, stress, depression  Mental Status Exam: Appearance:  Well Groomed     Behavior: Appropriate  Motor: Normal  Speech/Language:  Normal Rate  Affect: Appropriate  Mood: normal  Thought process: normal  Thought content:   WNL  Sensory/Perceptual disturbances:   WNL  Orientation: oriented to person, place, time/date, situation, day of week, month of year, and year  Attention: Good  Concentration: Good  Memory: WNL  Fund of knowledge:  Good  Insight:   Good  Judgment:  Good  Impulse Control: Good   Risk Assessment: Danger to Self:  No Self-injurious Behavior: No Danger to Others: No Duty to Warn:no Physical Aggression / Violence:No  Access to Firearms a concern: No  Gang Involvement:No   Subjective: The patient has a lot on his plate.  He and his daughter are recuperating from the illness that they had.  He went up in dosage on his Septra bound shot and had a negative gastrointestinal response to it.  He said for days afterward he was very uncomfortable and at times got dizzy when he laying down or stood up too quickly.  He expected some of the gastrointestinal but it was more intense than when he started on the smaller dosage months ago.  His second 1 is due this weekend and he is going to try to eat a little more and hydrate himself a little more to minimize the impact of the increased dosage.  He is thankful and that he has  lost 20 pounds over the last 3 months knowing that puts him in a better place physically.  For the most part he has not experienced any cardiac issues and he is thankful that is going smoothly.  He is getting a little bit of a buy and from his wife and daughter in terms of helping out around the house and encouraged him to continually be assertive and calm and addressing getting them to help more taking some of the stress off of him.  Work is still very busy but that is an expectation based on his job and all this going on with what his company does in the world.  He feels that he is coping with his anxiety as well as he can using coping skills to medication etc. He does continue to practice stress reduction techniques as we have practiced some.  He does contract for safety having no thoughts of hurting himself or anyone else.  Interventions: Cognitive Behavioral Therapy and Dialectical Behavioral Therapy Diagnosis:Generalized anxiety disorder  Plan: I will meet with the patient every 2 to 3 weeks. Treatment plan: Goals are to reduce the patient's anxiety and some mild depression primarily related to medical condition but also other family factors by at least 50% with a target date of July 31, 2023.  Goals for minimizing depression are for the patient to have less sadness as indicated by his report and PHQ-9 scores, have improved mood and return to a healthier  level of functioning.  We will look at any other causes for depression in addition to those stated above.  We will explore how depression is experienced by him, use cognitive behavioral therapy to explore and replace unhealthy thought and behavior patterns contributing to depression as well as encouraging the sharing of feelings related to causes and symptoms of depression.  We will introduce coping skills for managing depressive symptoms.  Goals for reducing anxiety include improving his ability to better manage stress and anxiety symptoms,  identify causes for anxiety and introduce ways to lower it including resolving core conflicts contributing to the anxiety.  Finally a goal for the patient will be to manage thoughts and worrisome thinking contributing to feelings of anxiety.  Interventions include providing education about anxiety to help him understand his causes and triggers, facilitate problem solution skills and coping skills for managing anxiety.  We will also use cognitive behavioral therapy to identify and change anxiety provoking thought and behavior patterns as well as dialectical behavior therapy to introduce distress tolerance and mindfulness skills. I reviewed the treatment goals as listed above with patient's agreement and extended the target date to January 30, 2024. Progress: 30%  Cecile Coder, [facility]                                Cecile Coder, [facility]               Cecile Coder, [facility]               Cecile Coder, [facility]               Cecile Coder, [facility]               Cecile Coder, [facility]

## 2023-10-17 ENCOUNTER — Other Ambulatory Visit (HOSPITAL_BASED_OUTPATIENT_CLINIC_OR_DEPARTMENT_OTHER): Payer: Self-pay

## 2023-10-17 ENCOUNTER — Encounter (INDEPENDENT_AMBULATORY_CARE_PROVIDER_SITE_OTHER): Payer: Self-pay | Admitting: Internal Medicine

## 2023-10-17 ENCOUNTER — Ambulatory Visit (INDEPENDENT_AMBULATORY_CARE_PROVIDER_SITE_OTHER): Admitting: Internal Medicine

## 2023-10-17 DIAGNOSIS — I350 Nonrheumatic aortic (valve) stenosis: Secondary | ICD-10-CM

## 2023-10-17 DIAGNOSIS — E66812 Obesity, class 2: Secondary | ICD-10-CM

## 2023-10-17 DIAGNOSIS — E785 Hyperlipidemia, unspecified: Secondary | ICD-10-CM

## 2023-10-17 DIAGNOSIS — I10 Essential (primary) hypertension: Secondary | ICD-10-CM | POA: Diagnosis not present

## 2023-10-17 DIAGNOSIS — Z6832 Body mass index (BMI) 32.0-32.9, adult: Secondary | ICD-10-CM

## 2023-10-17 DIAGNOSIS — R7303 Prediabetes: Secondary | ICD-10-CM

## 2023-10-17 DIAGNOSIS — G4733 Obstructive sleep apnea (adult) (pediatric): Secondary | ICD-10-CM

## 2023-10-17 MED ORDER — TIRZEPATIDE-WEIGHT MANAGEMENT 10 MG/0.5ML ~~LOC~~ SOAJ
10.0000 mg | SUBCUTANEOUS | 0 refills | Status: DC
Start: 2023-10-17 — End: 2023-11-07

## 2023-10-17 NOTE — Progress Notes (Signed)
 Office: [phone]  /  Fax: [phone]  Weight Summary And Biometrics  Vitals Temp: 98.1 F (36.7 C) BP: 114/73 Pulse Rate: 61 SpO2: 95 %   Anthropometric Measurements Height: 5' 9 (1.753 m) Weight: 223 lb (101.2 kg) BMI (Calculated): 32.92 Weight at Last Visit: 237 lb Weight Lost Since Last Visit: 4 lb Weight Gained Since Last Visit: 0 lb Starting Weight: 247 lb Total Weight Loss (lbs): 14 lb (6.35 kg) Peak Weight: 285 lb   Body Composition  Body Fat %: 28.8 % Fat Mass (lbs): 64.4 lbs Muscle Mass (lbs): 151.4 lbs Total Body Water (lbs): 108.8 lbs Visceral Fat Rating : 16    RMR: 2282  Today's Visit #: 7  Starting Date: 03/21/23   Subjective   Chief Complaint: Obesity  Interval History Discussed the use of AI scribe software for clinical note transcription with the patient, who gave verbal consent to proceed.  History of Present Illness   Braxten Memmer is a 63 year old male who presents for medical weight management.  He has lost four pounds since his last office visit. He follows an 1800 calorie nutrition plan and tracks his calories approximately 90% of the time. He consumes more whole foods, gets the recommended amount of protein, and can easily skip meals, particularly breakfast and dinner, if he gets tied up. He engages in some yard work but does not participate in formal exercise.  He is on Zepbound  10 mg once a week and has done 2 shots out of 4.  He reports occasional nausea on the day of injection but notes increased satiety and decreased hunger.  He takes atorvastatin  10 mg daily for hyperlipidemia, lisinopril  10 mg daily for hypertension, and terazosin  1 mg daily. He also takes sertraline  for mental health reasons, which could contribute to weight gain.  He reports a decrease in body fat percentage to 28% and a reduction in visceral fat from 18 to 16. He notes changes in his waist size, with clothes fitting looser and pants falling  down. He tries to avoid spicy, fatty, and sugary foods to prevent stomach upset and is not a big liquor drinker.  He is currently asymptomatic from severe aortic stenosis and is being monitored for the development of symptoms. His family, particularly his mother, has suggested seeking a second opinion, but he is satisfied with his current cardiologist.       Challenges affecting patient progress: none.    Pharmacotherapy for weight management: He is currently taking Zepbound  with adequate clinical response  and without side effects..   Assessment and Plan   Treatment Plan For Obesity:  Recommended Dietary Goals  Marcella is currently in the action stage of change. As such, his goal is to continue weight management plan. He has agreed to: continue current plan  Behavioral Health and Counseling  We discussed the following behavioral modification strategies today: continue to work on maintaining a reduced calorie state, getting the recommended amount of protein, incorporating whole foods, making healthy choices, staying well hydrated and practicing mindfulness when eating..  Additional education and resources provided today: None  Recommended Physical Activity Goals  Maanav has been advised to work up to 150 minutes of moderate intensity aerobic activity a week and strengthening exercises 2-3 times per week for cardiovascular health, weight loss maintenance and preservation of muscle mass.   He has agreed to :  continue to gradually increase the amount and intensity of exercise routine  Pharmacotherapy  We discussed various medication options  to help Blain with his weight loss efforts and we both agreed to : Adequate clinical response to anti-obesity medication, continue current regimen and do not recommend further increases in GLP-1 due to gastrointestinal side effects  Associated Conditions Impacted by Obesity Treatment  Class 2 severe obesity with serious comorbidity and body  mass index (BMI) of 36.0 to 36.9 in adult, unspecified obesity type (HCC)  OSA (obstructive sleep apnea)  Prediabetes     Assessment and Plan    Obesity He is undergoing medical weight management and has lost four pounds since the last visit. He follows an 1800 calorie nutrition plan, tracking calories approximately 90% of the time, and reports eating more whole foods and getting the recommended amount of protein. He is not engaging in regular exercise but is doing some yard work. He is on a GLP-1 receptor agonist, which is helping with appetite suppression, but experiences nausea.. Body fat percentage has decreased to 28%, with a goal of under 25%. Visceral fat has decreased from 18 to 16. Emphasized the role of aerobic exercise in reducing visceral fat and the potential impact of alcohol on visceral adiposity. Highlighted the importance of maintaining a balance between appetite suppression and awareness of hunger cues to prevent future challenges if medication is discontinued. - Continue GLP-1 receptor agonist at current dose - Encourage hydration and regular water intake - Advise intake of at least 90 grams of protein daily - Recommend 30 grams of fiber daily from whole foods - Encourage aerobic exercise, aiming for 150 minutes per week - Monitor body composition and muscle mass - Provide anticipatory guidance for nausea management with ginger tea, peppermint tea  Essential Hypertension Blood pressure is well-controlled on lisinopril  10 mg daily and terazosin  1 mg daily.  Hyperlipidemia He is on atorvastatin  10 mg daily for cholesterol management.  Aortic Stenosis He has severe aortic stenosis but is currently asymptomatic. The condition is being monitored for the development of symptoms. He prefers to continue monitoring with his current cardiologist rather than seeking a second opinion, as he feels well and trusts his current care team.  Obstructive Sleep Apnea On CPAP with good  compliance.  Continue current weight management strategy inclusive of GLP-1  Prediabetes Continue Zepbound  for pharmacoprophylaxis.  General Health Maintenance Advised to maintain a healthy lifestyle to support weight management and overall health. - Encourage regular physical activity - Advise on balanced diet with adequate hydration and fiber intake  Follow-up Advised to return for follow-up in one month to monitor progress and adjust treatment as necessary. - Schedule follow-up appointment in one month        Objective   Physical Exam:  Blood pressure 114/73, pulse 61, temperature 98.1 F (36.7 C), height 5' 9 (1.753 m), weight 223 lb (101.2 kg), SpO2 95%. Body mass index is 32.93 kg/m.  General: He is overweight, cooperative, alert, well developed, and in no acute distress. PSYCH: Has normal mood, affect and thought process.   HEENT: EOMI, sclerae are anicteric. Lungs: Normal breathing effort, no conversational dyspnea. Extremities: No edema.  Neurologic: No gross sensory or motor deficits. No tremors or fasciculations noted.    Diagnostic Data Reviewed:  BMET    Component Value Date/Time   NA 136 06/23/2023 0923   K 4.0 06/23/2023 0923   CL 102 06/23/2023 0923   CO2 29 06/23/2023 0923   GLUCOSE 99 06/23/2023 0923   BUN 25 (H) 06/23/2023 0923   CREATININE 1.44 06/23/2023 0923   CALCIUM  9.7 06/23/2023 1610  GFRNONAA >60 04/09/2015 1140   GFRAA >60 04/09/2015 1140   Lab Results  Component Value Date   HGBA1C 5.3 03/21/2023   HGBA1C 5.0 07/22/2015   Lab Results  Component Value Date   INSULIN  12.6 03/21/2023   Lab Results  Component Value Date   TSH 0.78 04/10/2023   CBC    Component Value Date/Time   WBC 6.4 04/10/2023 1034   RBC 5.11 04/10/2023 1034   HGB 15.9 04/10/2023 1034   HCT 46.9 04/10/2023 1034   PLT 224.0 04/10/2023 1034   MCV 91.8 04/10/2023 1034   MCH 31.8 04/09/2015 1140   MCHC 34.0 04/10/2023 1034   RDW 12.9 04/10/2023 1034    Iron Studies No results found for: IRON, TIBC, FERRITIN, IRONPCTSAT Lipid Panel     Component Value Date/Time   CHOL 115 04/10/2023 1034   TRIG 78.0 04/10/2023 1034   HDL 45.40 04/10/2023 1034   CHOLHDL 3 04/10/2023 1034   VLDL 15.6 04/10/2023 1034   LDLCALC 54 04/10/2023 1034   Hepatic Function Panel     Component Value Date/Time   PROT 7.9 06/23/2023 0923   ALBUMIN 4.6 06/23/2023 0923   AST 22 06/23/2023 0923   ALT 27 06/23/2023 0923   ALKPHOS 58 06/23/2023 0923   BILITOT 0.8 06/23/2023 0923   BILIDIR 0.2 12/07/2022 0933      Component Value Date/Time   TSH 0.78 04/10/2023 1034   Nutritional Lab Results  Component Value Date   VD25OH 40.9 03/21/2023    Medications: Outpatient Encounter Medications as of 10/17/2023  Medication Sig   aspirin EC 81 MG tablet Take 81 mg by mouth daily.   atorvastatin  (LIPITOR) 10 MG tablet TAKE 1 TABLET BY MOUTH EVERY DAY   Azelastine  HCl 137 MCG/SPRAY SOLN INSTILL 1-2 SPRAYS INTO BOTH NOSTRILS 2 TIMES DAILY AS DIRECTED   chlorhexidine (PERIDEX) 0.12 % solution    fluticasone  (FLONASE ) 50 MCG/ACT nasal spray USE 2 SPRAYS IN BOTH NOSTRILS  DAILY   hydrOXYzine  (ATARAX ) 10 MG tablet Take 1 tablet (10 mg total) by mouth 3 (three) times daily as needed for anxiety.   lisinopril  (ZESTRIL ) 10 MG tablet TAKE 1 TABLET BY MOUTH DAILY   Multiple Vitamins-Minerals (MULTIVITAMIN WITH MINERALS) tablet Take 1 tablet by mouth daily.   psyllium (REGULOID) 0.52 g capsule Take 0.52 g by mouth daily.   sertraline  (ZOLOFT ) 100 MG tablet Take 1 tablet (100 mg total) by mouth daily.   terazosin  (HYTRIN ) 1 MG capsule TAKE 1 CAPSULE BY MOUTH AT  BEDTIME   terbinafine  (LAMISIL ) 250 MG tablet Take 1 tablet (250 mg total) by mouth daily.   tirzepatide  (ZEPBOUND ) 10 MG/0.5ML Pen Inject 10 mg into the skin once a week.   No facility-administered encounter medications on file as of 10/17/2023.     Follow-Up   No follow-ups on file.Aaron Aas He was informed of  the importance of frequent follow up visits to maximize his success with intensive lifestyle modifications for his multiple health conditions.  Attestation Statement   Reviewed by clinician on day of visit: allergies, medications, problem list, medical history, surgical history, family history, social history, and previous encounter notes.     Ladd Picker, MD

## 2023-10-17 NOTE — Assessment & Plan Note (Signed)
 Severe OSA per history.  On CPAP with reported good compliance. Continue PAP therapy.  Losing 15% of body weight may reduce AHI.  Now on Zepbound  will be increasing to 10 mg once a week.

## 2023-10-23 ENCOUNTER — Ambulatory Visit (INDEPENDENT_AMBULATORY_CARE_PROVIDER_SITE_OTHER): Payer: BLUE CROSS/BLUE SHIELD | Admitting: Family Medicine

## 2023-10-23 ENCOUNTER — Encounter: Payer: Self-pay | Admitting: Family Medicine

## 2023-10-23 VITALS — BP 128/74 | HR 72 | Temp 98.0°F | Resp 18 | Ht 69.0 in | Wt 225.0 lb

## 2023-10-23 DIAGNOSIS — R944 Abnormal results of kidney function studies: Secondary | ICD-10-CM | POA: Diagnosis not present

## 2023-10-23 DIAGNOSIS — F419 Anxiety disorder, unspecified: Secondary | ICD-10-CM | POA: Diagnosis not present

## 2023-10-23 DIAGNOSIS — B351 Tinea unguium: Secondary | ICD-10-CM | POA: Diagnosis not present

## 2023-10-23 LAB — MICROALBUMIN / CREATININE URINE RATIO
Creatinine,U: 53.6 mg/dL
Microalb Creat Ratio: UNDETERMINED mg/g (ref 0.0–30.0)
Microalb, Ur: <0.7 mg/dL

## 2023-10-23 MED ORDER — SERTRALINE HCL 100 MG PO TABS: 200.0000 mg | ORAL_TABLET | Freq: Every day | ORAL | 1 refills | Status: AC

## 2023-10-23 NOTE — Patient Instructions (Signed)
 Continue follow-up with therapist to help with some of the difficulty in the morning.  I do think it is reasonable to try a higher dose of sertraline , at 200 mg/day.  If you do have any side effects on that dose then we can try backing off slightly to 150 mg/day.  Let me know.  Continue follow-up with weight management and podiatry.  Hoping that the laser treatment will help your toenails.  No med changes from me otherwise today.    If any concerns on labs I will let you know.  Continue to stay hydrated, avoid NSAIDs like Advil  or Aleve to help protect kidneys.   Recheck in 6 weeks to discuss the new dose of sertraline , that can be a virtual visit.  Please let me know if there are questions.

## 2023-10-23 NOTE — Progress Notes (Signed)
 Subjective:  Patient ID: Thai Burgueno, male    DOB: 24-Mar-1961  Age: 63 y.o. MRN: [id number]  CC:  Chief Complaint  Patient presents with   Medical Management of Chronic Issues    Pt is doing well, notes he is not fasting today     HPI Mouhamad Teed presents for   Anxiety Follow-up from February visit.  Treated with sertraline , up to 50 mg daily at his last visit, hydroxyzine  once in the morning daily at that time, decided to increase sertraline  again to 100 mg daily.  Continued on hydroxyzine  if needed for breakthrough anxiety, and continued meeting with therapist.  Still hydroxyzine  daily in the morning with some anxiety in am for few hours.  Still working with therapist, stressful mornings. Working thorough some changes with therapist. Minimal improvement with slight increase to 100mg . Would like to try 200mg  zoloft  dose.      10/23/2023   10:20 AM 06/23/2023    8:34 AM 05/11/2023    9:24 AM 04/10/2023    9:42 AM  GAD 7 : Generalized Anxiety Score  Nervous, Anxious, on Edge 1 1 1 2   Control/stop worrying 0 0 0 2  Worry too much - different things 1 0 1 2  Trouble relaxing 0 0 1 1  Restless 0 0 0 0  Easily annoyed or irritable 0 1 0 1  Afraid - awful might happen 0 0 1 2  Total GAD 7 Score 2 2 4 10   Anxiety Difficulty Not difficult at all          10/23/2023   10:20 AM 06/23/2023    8:34 AM 05/11/2023    9:24 AM 04/10/2023    9:42 AM 02/20/2023    8:35 AM  Depression screen PHQ 2/9  Decreased Interest 0 0 0 0 0  Down, Depressed, Hopeless 0 1 1 1 1   PHQ - 2 Score 0 1 1 1 1   Altered sleeping 0 0 0 1   Tired, decreased energy 0 0  0   Change in appetite 0 1 0 0   Feeling bad or failure about yourself  0 0 0 0   Trouble concentrating 0 0 0 0   Moving slowly or fidgety/restless 0 0 0 0   Suicidal thoughts 0 0 0 0   PHQ-9 Score 0 2 1 2    Difficult doing work/chores Not difficult at all       History of elevated creatinine Possible component of CKD, and discussed  options of meeting with nephrologist again. Appt noted with Dr. Norine in 2024 - [id number] secondary to HTN. Arterionephroscelerosis. Favorable prognosis, f/u prn. No proteinuria at that time.   He is on ACE inhibitor for hypertension.  NSAID avoidance has been discussed.  eGFR was 57 with creatinine 1.34 in December 2024, improved from previous 1.48.  Of note he is under the care of healthy weight and wellness for obesity, OSA, prediabetes. Fatigue day after zepbound  injection. Working on weight loss, will need aortic valve replacement at some point, no new symptoms at this time- ongoing follow up with cardiology. No CP/DOE.    Lab Results  Component Value Date   CREATININE 1.44 06/23/2023    Onychomycosis Discussed in February, minimal change with use of Lamisil , ciclopirox , but completed current course of Lamisil  at that time, and referred to podiatry. Appointment noted with podiatry on May 16.  Laser treatment via pinpoint laser therapy system administered. Back on lamisil  as well. Off ciclopirox .  History Patient Active Problem List   Diagnosis Date Noted   History of colonic polyps 04/14/2023   At increased risk for cardiovascular disease 04/04/2023   Prediabetes 04/04/2023   Decreased GFR 03/21/2023   Hyperlipidemia 03/21/2023   Hx of adenomatous colonic polyps 03/20/2023   Severe aortic stenosis 03/20/2023   Emphysema lung (HCC) 01/13/2022   Systolic murmur 01/11/2019   Class 2 severe obesity with serious comorbidity and body mass index (BMI) of 36.0 to 36.9 in adult [facility]) 08/13/2018   Prostate cancer screening 06/07/2017   Internal hemorrhoid 06/07/2017   Visit for preventive health examination 07/26/2015   Quit smoking 07/01/2015   OSA (obstructive sleep apnea) 06/10/2015   Essential hypertension, benign 06/10/2015   Past Medical History:  Diagnosis Date   Allergy    Seasonal   Anxiety    Aortic valve stenosis 08/10/2022   severe   Chicken pox    Heart murmur     Asymptomatic -- Patient endorses previous negative evaluation   High cholesterol    Hypertension    Sleep apnea    CPAP nightly   Past Surgical History:  Procedure Laterality Date   COLONOSCOPY     COLONOSCOPY WITH PROPOFOL  N/A 04/14/2023   Procedure: COLONOSCOPY WITH PROPOFOL ;  Surgeon: Wilhelmenia Aloha Raddle., MD;  Location: [facility] ENDOSCOPY;  Service: Gastroenterology;  Laterality: N/A;   POLYPECTOMY  04/14/2023   Procedure: POLYPECTOMY;  Surgeon: Wilhelmenia Aloha Raddle., MD;  Location: [facility] ENDOSCOPY;  Service: Gastroenterology;;   WISDOM TOOTH EXTRACTION     No Known Allergies Prior to Admission medications   Medication Sig Start Date End Date Taking? Authorizing Provider  aspirin EC 81 MG tablet Take 81 mg by mouth daily.   Yes [provider]  atorvastatin  (LIPITOR) 10 MG tablet TAKE 1 TABLET BY MOUTH EVERY DAY 05/29/23  Yes Levora Reyes SAUNDERS, MD  Azelastine  HCl 137 MCG/SPRAY SOLN INSTILL 1-2 SPRAYS INTO BOTH NOSTRILS 2 TIMES DAILY AS DIRECTED 08/21/23  Yes Levora Reyes SAUNDERS, MD  chlorhexidine (PERIDEX) 0.12 % solution  02/06/23  Yes [provider]  fluticasone  (FLONASE ) 50 MCG/ACT nasal spray USE 2 SPRAYS IN BOTH NOSTRILS  DAILY 05/15/23  Yes Levora Reyes SAUNDERS, MD  hydrOXYzine  (ATARAX ) 10 MG tablet Take 1 tablet (10 mg total) by mouth 3 (three) times daily as needed for anxiety. 05/11/23  Yes Levora Reyes SAUNDERS, MD  lisinopril  (ZESTRIL ) 10 MG tablet TAKE 1 TABLET BY MOUTH DAILY 07/17/23  Yes Levora Reyes SAUNDERS, MD  Multiple Vitamins-Minerals (MULTIVITAMIN WITH MINERALS) tablet Take 1 tablet by mouth daily. 08/17/23  Yes Francyne Romano, MD  psyllium (REGULOID) 0.52 g capsule Take 0.52 g by mouth daily.   Yes [provider]  sertraline  (ZOLOFT ) 100 MG tablet Take 1 tablet (100 mg total) by mouth daily. 06/23/23  Yes Levora Reyes SAUNDERS, MD  terazosin  (HYTRIN ) 1 MG capsule TAKE 1 CAPSULE BY MOUTH AT  BEDTIME 07/17/23  Yes Levora Reyes SAUNDERS, MD  terbinafine  (LAMISIL ) 250  MG tablet Take 1 tablet (250 mg total) by mouth daily. 07/31/23 10/29/23 Yes Sikora, Rebecca, DPM  tirzepatide  (ZEPBOUND ) 10 MG/0.5ML Pen Inject 10 mg into the skin once a week. 10/17/23  Yes Francyne Romano, MD   Social History   Socioeconomic History   Marital status: Married    Spouse name: Josette   Number of children: Not on file   Years of education: Not on file   Highest education level: Master's degree (e.g., MA, MS, MEng, MEd, MSW, MBA)  Occupational  History   Not on file  Tobacco Use   Smoking status: Former    Current packs/day: 0.00    Average packs/day: 0.3 packs/day for 25.0 years (6.3 ttl pk-yrs)    Types: Cigarettes    Start date: 05/17/1990    Quit date: 05/18/2015    Years since quitting: 8.4   Smokeless tobacco: Never  Vaping Use   Vaping status: Never Used  Substance and Sexual Activity   Alcohol use: Yes    Alcohol/week: 2.0 - 4.0 standard drinks of alcohol    Types: 2 - 4 Standard drinks or equivalent per week    Comment: social   Drug use: Not Currently    Types: Marijuana   Sexual activity: Yes  Other Topics Concern   Not on file  Social History Narrative   Not on file   Social Drivers of Health   Financial Resource Strain: Low Risk  (05/08/2023)   Overall Financial Resource Strain (CARDIA)    Difficulty of Paying Living Expenses: Not hard at all  Food Insecurity: No Food Insecurity (05/08/2023)   Hunger Vital Sign    Worried About Running Out of Food in the Last Year: Never true    Ran Out of Food in the Last Year: Never true  Transportation Needs: No Transportation Needs (05/08/2023)   PRAPARE - Administrator, Civil Service (Medical): No    Lack of Transportation (Non-Medical): No  Physical Activity: Sufficiently Active (05/08/2023)   Exercise Vital Sign    Days of Exercise per Week: 5 days    Minutes of Exercise per Session: 60 min  Stress: No Stress Concern Present (05/08/2023)   [employer] of Occupational Health - Occupational  Stress Questionnaire    Feeling of Stress : Only a little  Recent Concern: Stress - Stress Concern Present (04/09/2023)   [employer] of Occupational Health - Occupational Stress Questionnaire    Feeling of Stress : To some extent  Social Connections: Socially Isolated (05/08/2023)   Social Connection and Isolation Panel    Frequency of Communication with Friends and Family: Once a week    Frequency of Social Gatherings with Friends and Family: Once a week    Attends Religious Services: Never    Database administrator or Organizations: No    Attends Engineer, structural: Not on file    Marital Status: Married  Catering manager Violence: Not on file    Review of Systems   Objective:   Vitals:   10/23/23 0938  BP: 128/74  Pulse: 72  Resp: 18  Temp: 98 F (36.7 C)  TempSrc: Temporal  SpO2: 96%  Weight: 225 lb (102.1 kg)  Height: 5' 9 (1.753 m)     Physical Exam Vitals reviewed.  Constitutional:      Appearance: He is well-developed.  HENT:     Head: Normocephalic and atraumatic.  Neck:     Vascular: No carotid bruit or JVD.   Cardiovascular:     Rate and Rhythm: Normal rate and regular rhythm.     Heart sounds: Murmur (3-4/6 SEM) heard.  Pulmonary:     Effort: Pulmonary effort is normal.     Breath sounds: Normal breath sounds. No rales.   Musculoskeletal:     Right lower leg: No edema.     Left lower leg: No edema.   Skin:    General: Skin is warm and dry.   Neurological:     General: No focal deficit present.  Mental Status: He is alert and oriented to person, place, and time.   Psychiatric:        Mood and Affect: Mood normal.        Behavior: Behavior normal.        Thought Content: Thought content normal.        Assessment & Plan:  Leigh Kaeding is a 63 y.o. male . Anxiety - Plan: sertraline  (ZOLOFT ) 100 MG tablet  - Still room for improvement with control, will try 200 mg dose of sertraline  with potential side effects  discussed, if he does have side effects at that dose can back off to 150 mg.  Continue follow-up with therapist, hydroxyzine  if needed.  Recheck 6 weeks.  Onychomycosis - Plan: Comprehensive metabolic panel with GFR  - Now under the care of podiatry.  Will check CMP as he is back on Lamisil .  Decreased GFR - Plan: Microalbumin / creatinine urine ratio, Comprehensive metabolic panel with GFR  - Nephrology evaluation last year, possible CKD from hypertension, arterionephrosclerosis with favorable prognosis.  As needed follow-up.  Check updated CMP with GFR, urine microalbumin creatinine ratio although no proteinuria on urinalysis with urology last year.  Continue to avoid nephrotoxins and maintain hydration.  Meds ordered this encounter  Medications   sertraline  (ZOLOFT ) 100 MG tablet    Sig: Take 2 tablets (200 mg total) by mouth daily.    Dispense:  180 tablet    Refill:  1   Patient Instructions  Continue follow-up with therapist to help with some of the difficulty in the morning.  I do think it is reasonable to try a higher dose of sertraline , at 200 mg/day.  If you do have any side effects on that dose then we can try backing off slightly to 150 mg/day.  Let me know.  Continue follow-up with weight management and podiatry.  Hoping that the laser treatment will help your toenails.  No med changes from me otherwise today.    If any concerns on labs I will let you know.  Continue to stay hydrated, avoid NSAIDs like Advil  or Aleve to help protect kidneys.   Recheck in 6 weeks to discuss the new dose of sertraline , that can be a virtual visit.  Please let me know if there are questions.      Signed,   Reyes Pines, MD [facility], [facility] 10/23/23 12:48 PM

## 2023-10-24 LAB — COMPREHENSIVE METABOLIC PANEL WITH GFR
ALT: 28 U/L (ref 0–53)
AST: 24 U/L (ref 0–37)
Albumin: 4.7 g/dL (ref 3.5–5.2)
Alkaline Phosphatase: 48 U/L (ref 39–117)
BUN: 25 mg/dL — ABNORMAL HIGH (ref 6–23)
CO2: 25 meq/L (ref 19–32)
Calcium: 10.3 mg/dL (ref 8.4–10.5)
Chloride: 101 meq/L (ref 96–112)
Creatinine, Ser: 1.32 mg/dL (ref 0.40–1.50)
GFR: 58.58 mL/min — ABNORMAL LOW (ref >60.00)
Glucose, Bld: 75 mg/dL (ref 70–99)
Potassium: 4.6 meq/L (ref 3.5–5.1)
Sodium: 138 meq/L (ref 135–145)
Total Bilirubin: 1 mg/dL (ref 0.2–1.2)
Total Protein: 7.8 g/dL (ref 6.0–8.3)

## 2023-10-25 ENCOUNTER — Ambulatory Visit: Payer: Self-pay | Admitting: Family Medicine

## 2023-10-25 ENCOUNTER — Ambulatory Visit: Admitting: Behavioral Health

## 2023-10-28 ENCOUNTER — Other Ambulatory Visit: Payer: Self-pay | Admitting: Family Medicine

## 2023-10-28 DIAGNOSIS — F419 Anxiety disorder, unspecified: Secondary | ICD-10-CM

## 2023-10-30 ENCOUNTER — Encounter: Payer: Self-pay | Admitting: Podiatry

## 2023-10-30 ENCOUNTER — Ambulatory Visit (INDEPENDENT_AMBULATORY_CARE_PROVIDER_SITE_OTHER): Admitting: Podiatry

## 2023-10-30 ENCOUNTER — Encounter (INDEPENDENT_AMBULATORY_CARE_PROVIDER_SITE_OTHER): Payer: Self-pay | Admitting: Internal Medicine

## 2023-10-30 DIAGNOSIS — B351 Tinea unguium: Secondary | ICD-10-CM

## 2023-10-30 NOTE — Progress Notes (Signed)
  Subjective:  Patient ID: Steven Sloan, male    DOB: Feb 24, 1961,   MRN: [id number]  Chief Complaint  Patient presents with   Nail Problem    F/U nail fungus and Lamisil  and lazer treatment. Non diabetic. O pain.    63 y.o. male presents for follow-up of fungal nails . Is still taking the lamisil  and has had one laser treatment.  Denies any other pedal complaints. Denies n/v/f/c.   Past Medical History:  Diagnosis Date   Allergy    Seasonal   Anxiety    Aortic valve stenosis 08/10/2022   severe   Chicken pox    Heart murmur    Asymptomatic -- Patient endorses previous negative evaluation   High cholesterol    Hypertension    Sleep apnea    CPAP nightly    Objective:  Physical Exam: Vascular: DP/PT pulses 2/4 bilateral. CFT <3 seconds. Normal hair growth on digits. No edema.  Skin. No lacerations or abrasions bilateral feet. Left hallux nail with distal thickened and subungual debris. Does appear improved some thickened and while scaling noted. Distal 1/2 of nail.  Musculoskeletal: MMT 5/5 bilateral lower extremities in DF, PF, Inversion and Eversion. Deceased ROM in DF of ankle joint.  Neurological: Sensation intact to light touch.   Assessment:   1. Onychomycosis       Plan:  Patient was evaluated and treated and all questions answered. -Examined patient -Discussed treatment options for painful dystrophic nails  -Culture positive for fungus. T rubrum.  -Discussed fungal nail treatment options including oral, topical, and laser treatments.  Patient would like to try laser treatment but would also like to continue lamisil  as well.  Finish out lamisil  and continue laser treatments.  -Patient to return in 4 months for recheck  Asberry Failing, DPM

## 2023-11-07 ENCOUNTER — Other Ambulatory Visit (INDEPENDENT_AMBULATORY_CARE_PROVIDER_SITE_OTHER): Payer: Self-pay | Admitting: Internal Medicine

## 2023-11-07 ENCOUNTER — Other Ambulatory Visit (INDEPENDENT_AMBULATORY_CARE_PROVIDER_SITE_OTHER): Payer: Self-pay

## 2023-11-07 DIAGNOSIS — G4733 Obstructive sleep apnea (adult) (pediatric): Secondary | ICD-10-CM

## 2023-11-07 MED ORDER — TIRZEPATIDE-WEIGHT MANAGEMENT 7.5 MG/0.5ML ~~LOC~~ SOLN: 7.5000 mg | SUBCUTANEOUS | 0 refills | Status: DC

## 2023-11-08 ENCOUNTER — Telehealth (INDEPENDENT_AMBULATORY_CARE_PROVIDER_SITE_OTHER): Payer: Self-pay

## 2023-11-08 NOTE — Telephone Encounter (Signed)
 PA for Zepbound  2.5 has been approved. PA is now complete.     Message from Plan Request Reference Number: [id number]. ZEPBOUND  INJ 2.5/0.5 is approved through 05/09/2024. Please refer to the fax or electronic case notice for further information.. Authorization Expiration Date: May 09, 2024.

## 2023-11-09 ENCOUNTER — Telehealth (INDEPENDENT_AMBULATORY_CARE_PROVIDER_SITE_OTHER): Payer: Self-pay | Admitting: Internal Medicine

## 2023-11-09 ENCOUNTER — Telehealth (INDEPENDENT_AMBULATORY_CARE_PROVIDER_SITE_OTHER): Payer: Self-pay

## 2023-11-09 ENCOUNTER — Other Ambulatory Visit (INDEPENDENT_AMBULATORY_CARE_PROVIDER_SITE_OTHER): Payer: Self-pay

## 2023-11-09 DIAGNOSIS — G4733 Obstructive sleep apnea (adult) (pediatric): Secondary | ICD-10-CM

## 2023-11-09 MED ORDER — TIRZEPATIDE-WEIGHT MANAGEMENT 7.5 MG/0.5ML ~~LOC~~ SOAJ: 7.5000 mg | SUBCUTANEOUS | 0 refills | Status: DC

## 2023-11-09 MED ORDER — ONDANSETRON HCL 4 MG PO TABS: 4.0000 mg | ORAL_TABLET | Freq: Three times a day (TID) | ORAL | 0 refills | Status: DC | PRN

## 2023-11-09 NOTE — Telephone Encounter (Signed)
 This PA has already been approved, however the patient just picked up a script for 10MG , the insurance company is requiring that the patient use 75% of the 10 MG before they will pay for the 7.5 script.

## 2023-11-09 NOTE — Telephone Encounter (Signed)
 Good morning!  [employer] called to say that the PA for zepbound  was approved already but the script can not be filled untill 7/22.

## 2023-11-09 NOTE — Telephone Encounter (Signed)
 PA questions for Zepbound 7.5  have been answered and all documentation has been included. Waiting on a determination.

## 2023-11-09 NOTE — Telephone Encounter (Signed)
 PA for Zepbound 7.5 has been submitted, awaiting PA questions.

## 2023-11-09 NOTE — Telephone Encounter (Signed)
 Pt can pick up 7.5 mg Zepbound  in 3 weeks due to picking up 10.5mg .  He will continue 10.5 until he gets the 7.5mg .  Sent in Zofran .  Pt will d/c , and wait for the 7.5 mg dose, if having worsening side effects.

## 2023-11-14 ENCOUNTER — Ambulatory Visit (INDEPENDENT_AMBULATORY_CARE_PROVIDER_SITE_OTHER): Admitting: Internal Medicine

## 2023-11-14 ENCOUNTER — Encounter (INDEPENDENT_AMBULATORY_CARE_PROVIDER_SITE_OTHER): Payer: Self-pay | Admitting: Internal Medicine

## 2023-11-14 VITALS — BP 119/78 | HR 60 | Temp 97.5°F | Ht 69.0 in | Wt 215.0 lb

## 2023-11-14 DIAGNOSIS — R110 Nausea: Secondary | ICD-10-CM | POA: Diagnosis not present

## 2023-11-14 DIAGNOSIS — Z6831 Body mass index (BMI) 31.0-31.9, adult: Secondary | ICD-10-CM | POA: Diagnosis not present

## 2023-11-14 DIAGNOSIS — Z6836 Body mass index (BMI) 36.0-36.9, adult: Secondary | ICD-10-CM

## 2023-11-14 DIAGNOSIS — E66812 Obesity, class 2: Secondary | ICD-10-CM

## 2023-11-14 MED ORDER — TIRZEPATIDE-WEIGHT MANAGEMENT 5 MG/0.5ML ~~LOC~~ SOAJ: 5.0000 mg | SUBCUTANEOUS | 0 refills | Status: DC

## 2023-11-14 NOTE — Assessment & Plan Note (Signed)
 Medication-induced gastrointestinal side effects He experiences nausea, constipation, and weakness, likely due to the current 10 mg dosage of his weight management medication. These symptoms have persisted since increasing the dose, which is considered a maintenance dose based on studies but not clinically appropriate for him. He reports better tolerance at 5 mg, where he experienced weight loss without significant side effects. The decision is to lower the dose to 5 mg, as the lowest effective dose is preferred to minimize side effects while maintaining weight loss benefits. - Discontinue current medication dose of 10 mg. - Avoid medication dose on Saturday. - Restart medication at 5 mg after a week without medication. - Ensure adequate hydration with sips of water or half water/half Gatorade throughout the day to prevent dehydration. - Adopt a bland diet with small, frequent meals, avoiding raw vegetables and high-fat foods. - Continue Zofran  as needed for nausea, up to every 8 hours. - Monitor for dehydration and consider blood work if symptoms suggest dehydration.

## 2023-11-14 NOTE — Progress Notes (Signed)
 Office: [phone]  /  Fax: [phone]  Weight Summary and Body Composition Analysis (BIA)  Vitals Temp: (!) 97.5 F (36.4 C) BP: 119/78 Pulse Rate: 60 SpO2: 95 %   Anthropometric Measurements Height: 5' 9 (1.753 m) Weight: 215 lb (97.5 kg) BMI (Calculated): 31.74 Weight at Last Visit: 223 lb Weight Lost Since Last Visit: 8 lb Weight Gained Since Last Visit: 0 lb Starting Weight: 247 lb Total Weight Loss (lbs): 32 lb (14.5 kg) Peak Weight: 285 lb   Body Composition  Body Fat %: 26.8 % Fat Mass (lbs): 57.8 lbs Muscle Mass (lbs): 150 lbs Total Body Water (lbs): 105.4 lbs Visceral Fat Rating : 15    RMR: 2282  Today's Visit #: 8  Starting Date: 03/21/23   Subjective   Chief Complaint: Obesity  Interval History Discussed the use of AI scribe software for clinical note transcription with the patient, who gave verbal consent to proceed.  History of Present Illness   Steven Sloan is a 63 year old male who presents for medical weight management.  He follows an 1800 calorie nutrition plan with fair adherence but does not engage in any exercise.  Since increasing his Zepbound  to 10 mg a week he experiences nausea, particularly in the mornings, which he describes as similar to 'morning sickness'. Despite drinking liquids before bed and urinating multiple times at night, he feels nauseous upon waking. Relief is found by drinking a couple of big glasses of water, feeling better approximately two hours later, although he experiences tingling and feels the need to eat something.  We had advised reduction but patient took 10 mg dose on Saturday.   He is using the medication for weight management and sleep apnea, and he has been losing weight but also feels weak. He reports a significant reduction in body fat percentage and also a decrease in muscle mass.  He describes his symptoms as a 'sour stomach' feeling, a combination of hunger and fullness, leading to  nausea. He has been taking Zofran  for nausea, which he finds helpful, and he takes it up to every eight hours. No dizziness, lightheadedness, severe stomach pains, or significant gas, although he occasionally feels a little full.       Challenges affecting patient progress: Side effects of GLP-1.    Pharmacotherapy for weight management: He is currently taking Zepbound  with adequate clinical response  and experiencing the following side effects: Nausea and upset stomach..   Assessment and Plan   Treatment Plan For Obesity:  Recommended Dietary Goals  Steven Sloan is currently in the action stage of change. As such, his goal is to continue weight management plan. He has agreed to: Patient advised to switch to a bland diet for the next 7 days and to hold GLP-1  Behavioral Health and Counseling  We discussed the following behavioral modification strategies today: continue to work on maintaining a reduced calorie state, getting the recommended amount of protein, incorporating whole foods, making healthy choices, staying well hydrated and practicing mindfulness when eating..  Additional education and resources provided today: None  Recommended Physical Activity Goals  Steven Sloan has been advised to work up to 150 minutes of moderate intensity aerobic activity a week and strengthening exercises 2-3 times per week for cardiovascular health, weight loss maintenance and preservation of muscle mass.   He has agreed to :  Think about enjoyable ways to increase daily physical activity and overcoming barriers to exercise and Increase physical activity in their day and reduce sedentary  time (increase NEAT).  Medical Interventions and Pharmacotherapy  We discussed various medication options to help Steven Sloan with his weight loss efforts and we both agreed to : Patient has been on the 10 mg of Zepbound  for about 6 weeks with unresolving nausea medication will be held for an entire week and he will be restarted  at the 5 mg dose.  He will continue to take Zofran  for nausea as needed.  We also discussed nutritional strategies to reduce side effects.  Associated Conditions Impacted by Obesity Treatment  Assessment & Plan Class 2 severe obesity with serious comorbidity and body mass index (BMI) of 36.0 to 36.9 in adult, unspecified obesity type (HCC) He has obesity, with a significant weight loss from 285 lbs to 215 lbs. His current BMI is 31, and his body fat percentage is 26%. The goal is to reduce body fat percentage to below 24% and achieve a target weight of 190 lbs. He has lost muscle mass due to inadequate nutrition and side effects from medication, impacting his ability to exercise. The focus is on body composition rather than BMI alone, aiming for a healthier body fat percentage. - Focus on gradual weight loss with a target body fat percentage below 24%. - Encourage resumption of physical activity, such as walking, as tolerated. - Educate on the importance of maintaining muscle mass and adequate nutrition. - Reduce Zepbound  to 5 mg once a week due to side effects Nausea Medication-induced gastrointestinal side effects He experiences nausea, constipation, and weakness, likely due to the current 10 mg dosage of his weight management medication. These symptoms have persisted since increasing the dose, which is considered a maintenance dose based on studies but not clinically appropriate for him. He reports better tolerance at 5 mg, where he experienced weight loss without significant side effects. The decision is to lower the dose to 5 mg, as the lowest effective dose is preferred to minimize side effects while maintaining weight loss benefits. - Discontinue current medication dose of 10 mg. - Avoid medication dose on Saturday. - Restart medication at 5 mg after a week without medication. - Ensure adequate hydration with sips of water or half water/half Gatorade throughout the day to prevent  dehydration. - Adopt a bland diet with small, frequent meals, avoiding raw vegetables and high-fat foods. - Continue Zofran  as needed for nausea, up to every 8 hours. - Monitor for dehydration and consider blood work if symptoms suggest dehydration.      General Health Maintenance Emphasis on body composition over BMI, with a focus on hydration and balanced nutrition to support overall health and weight management goals. - Educate on the importance of body composition over BMI. - Encourage adequate hydration and balanced nutrition.        Objective   Physical Exam:  Blood pressure 119/78, pulse 60, temperature (!) 97.5 F (36.4 C), height 5' 9 (1.753 m), weight 215 lb (97.5 kg), SpO2 95%. Body mass index is 31.75 kg/m.  General: He is overweight, cooperative, alert, well developed, and in no acute distress. PSYCH: Has normal mood, affect and thought process.   HEENT: EOMI, sclerae are anicteric. Lungs: Normal breathing effort, no conversational dyspnea. Extremities: No edema.  Neurologic: No gross sensory or motor deficits. No tremors or fasciculations noted.    Diagnostic Data Reviewed:  BMET    Component Value Date/Time   NA 138 10/23/2023 1022   K 4.6 10/23/2023 1022   CL 101 10/23/2023 1022   CO2 25 10/23/2023 1022  GLUCOSE 75 10/23/2023 1022   BUN 25 (H) 10/23/2023 1022   CREATININE 1.32 10/23/2023 1022   CALCIUM  10.3 10/23/2023 1022   GFRNONAA >60 04/09/2015 1140   GFRAA >60 04/09/2015 1140   Lab Results  Component Value Date   HGBA1C 5.3 03/21/2023   HGBA1C 5.0 07/22/2015   Lab Results  Component Value Date   INSULIN  12.6 03/21/2023   Lab Results  Component Value Date   TSH 0.78 04/10/2023   CBC    Component Value Date/Time   WBC 6.4 04/10/2023 1034   RBC 5.11 04/10/2023 1034   HGB 15.9 04/10/2023 1034   HCT 46.9 04/10/2023 1034   PLT 224.0 04/10/2023 1034   MCV 91.8 04/10/2023 1034   MCH 31.8 04/09/2015 1140   MCHC 34.0 04/10/2023 1034    RDW 12.9 04/10/2023 1034   Iron Studies No results found for: IRON, TIBC, FERRITIN, IRONPCTSAT Lipid Panel     Component Value Date/Time   CHOL 115 04/10/2023 1034   TRIG 78.0 04/10/2023 1034   HDL 45.40 04/10/2023 1034   CHOLHDL 3 04/10/2023 1034   VLDL 15.6 04/10/2023 1034   LDLCALC 54 04/10/2023 1034   Hepatic Function Panel     Component Value Date/Time   PROT 7.8 10/23/2023 1022   ALBUMIN 4.7 10/23/2023 1022   AST 24 10/23/2023 1022   ALT 28 10/23/2023 1022   ALKPHOS 48 10/23/2023 1022   BILITOT 1.0 10/23/2023 1022   BILIDIR 0.2 12/07/2022 0933      Component Value Date/Time   TSH 0.78 04/10/2023 1034   Nutritional Lab Results  Component Value Date   VD25OH 40.9 03/21/2023    Medications: Outpatient Encounter Medications as of 11/14/2023  Medication Sig   aspirin EC 81 MG tablet Take 81 mg by mouth daily.   atorvastatin  (LIPITOR) 10 MG tablet TAKE 1 TABLET BY MOUTH EVERY DAY   Azelastine  HCl 137 MCG/SPRAY SOLN INSTILL 1-2 SPRAYS INTO BOTH NOSTRILS 2 TIMES DAILY AS DIRECTED   chlorhexidine (PERIDEX) 0.12 % solution    fluticasone  (FLONASE ) 50 MCG/ACT nasal spray USE 2 SPRAYS IN BOTH NOSTRILS  DAILY   hydrOXYzine  (ATARAX ) 10 MG tablet Take 1 tablet (10 mg total) by mouth 3 (three) times daily as needed for anxiety.   lisinopril  (ZESTRIL ) 10 MG tablet TAKE 1 TABLET BY MOUTH DAILY   Multiple Vitamins-Minerals (MULTIVITAMIN WITH MINERALS) tablet Take 1 tablet by mouth daily.   ondansetron  (ZOFRAN ) 4 MG tablet Take 1 tablet (4 mg total) by mouth every 8 (eight) hours as needed for nausea or vomiting.   psyllium (REGULOID) 0.52 g capsule Take 0.52 g by mouth daily.   sertraline  (ZOLOFT ) 100 MG tablet Take 2 tablets (200 mg total) by mouth daily.   terazosin  (HYTRIN ) 1 MG capsule TAKE 1 CAPSULE BY MOUTH AT  BEDTIME   tirzepatide  (ZEPBOUND ) 5 MG/0.5ML Pen Inject 5 mg into the skin once a week.   [DISCONTINUED] tirzepatide  (ZEPBOUND ) 7.5 MG/0.5ML Pen Inject 7.5  mg into the skin once a week.   No facility-administered encounter medications on file as of 11/14/2023.     Follow-Up   No follow-ups on file.SABRA He was informed of the importance of frequent follow up visits to maximize his success with intensive lifestyle modifications for his multiple health conditions.  Attestation Statement   Reviewed by clinician on day of visit: allergies, medications, problem list, medical history, surgical history, family history, social history, and previous encounter notes.     Lucas Parker, MD

## 2023-11-14 NOTE — Assessment & Plan Note (Signed)
 He has obesity, with a significant weight loss from 285 lbs to 215 lbs. His current BMI is 31, and his body fat percentage is 26%. The goal is to reduce body fat percentage to below 24% and achieve a target weight of 190 lbs. He has lost muscle mass due to inadequate nutrition and side effects from medication, impacting his ability to exercise. The focus is on body composition rather than BMI alone, aiming for a healthier body fat percentage. - Focus on gradual weight loss with a target body fat percentage below 24%. - Encourage resumption of physical activity, such as walking, as tolerated. - Educate on the importance of maintaining muscle mass and adequate nutrition. - Reduce Zepbound  to 5 mg once a week due to side effects

## 2023-11-15 ENCOUNTER — Other Ambulatory Visit: Payer: Self-pay | Admitting: Family Medicine

## 2023-11-15 DIAGNOSIS — F419 Anxiety disorder, unspecified: Secondary | ICD-10-CM

## 2023-11-16 ENCOUNTER — Encounter: Payer: Self-pay | Admitting: Behavioral Health

## 2023-11-16 ENCOUNTER — Ambulatory Visit (INDEPENDENT_AMBULATORY_CARE_PROVIDER_SITE_OTHER): Admitting: Behavioral Health

## 2023-11-16 DIAGNOSIS — F411 Generalized anxiety disorder: Secondary | ICD-10-CM

## 2023-11-16 DIAGNOSIS — F4323 Adjustment disorder with mixed anxiety and depressed mood: Secondary | ICD-10-CM

## 2023-11-16 NOTE — Progress Notes (Signed)
 [facility] Counselor/Therapist Progress Note  Patient ID: Steven Sloan, MRN: [id number],    Date: 11/16/2023  Time Spent: 3 PM until 3:56 PM, 56 minutes.This session was held via video teletherapy. The patient consented to the video teletherapy and was located in his home office during this session. He is aware it is the responsibility of the patient to secure confidentiality on his end of the session. The provider was in a private home office for the duration of this session.    Treatment Type: Individual Therapy  Reported Symptoms: Anxiety, stress, depression  Mental Status Exam: Appearance:  Well Groomed     Behavior: Appropriate  Motor: Normal  Speech/Language:  Normal Rate  Affect: Appropriate  Mood: normal  Thought process: normal  Thought content:   WNL  Sensory/Perceptual disturbances:   WNL  Orientation: oriented to person, place, time/date, situation, day of week, month of year, and year  Attention: Good  Concentration: Good  Memory: WNL  Fund of knowledge:  Good  Insight:   Good  Judgment:  Good  Impulse Control: Good   Risk Assessment: Danger to Self:  No Self-injurious Behavior: No Danger to Others: No Duty to Warn:no Physical Aggression / Violence:No  Access to Firearms a concern: No  Gang Involvement:No   Subjective: The patient said that he has been sick the last 6 weeks related to the weight loss medication he has been taking.  He said he felt nauseous 24/7 and was always tired.  He had difficulty getting things done because of his fatigue level.  He gets a weekly injection and starting next week he will go to a reduced dosage and the doctor feels that will make a significant difference in his energy level as well as reducing the nausea.  He will not take a shot this week.  He has lost 32 pounds which is about half of where he wants to be but he feels now that his energy level will go up he will be able to exercise more.  The weather and heat  have been a hindrance and that also but he wants to build back up the muscle mass that he lost while still losing weight and the doctor feels that is possible.  He knows that he is doing all that he can to put himself in the best shape for his heart surgery whenever the symptoms start manifesting.  He doubled the dosage on one of his anxiety medications per Dr. Prescription and says now his anxiety is only about a 2 or 3 on most days with no panic attacks.  Been some progress at home and his wife at least doing more when he requested her to do so without much resistance.  There is still work to do with his daughter but he continues to include her in household responsibilities to take stress off of him.   He does still have some anxious thoughts about the heart procedure that he knows he will have to have but says for the most part he feels that he has calming skills and also can challenge those thoughts with somewhat answers and he notes that he is doing all that he can physically to be in the best shape possible for that surgery whenever it is needed.  He does continue to practice stress reduction techniques as we have practiced some.  He does contract for safety having no thoughts of hurting himself or anyone else.  Interventions: Cognitive Behavioral Therapy and Dialectical Behavioral Therapy Diagnosis:Generalized anxiety  disorder  Plan: I will meet with the patient every 2 to 3 weeks. Treatment plan: Goals are to reduce the patient's anxiety and some mild depression primarily related to medical condition but also other family factors by at least 50% with a target date of July 31, 2023.  Goals for minimizing depression are for the patient to have less sadness as indicated by his report and PHQ-9 scores, have improved mood and return to a healthier level of functioning.  We will look at any other causes for depression in addition to those stated above.  We will explore how depression is experienced by  him, use cognitive behavioral therapy to explore and replace unhealthy thought and behavior patterns contributing to depression as well as encouraging the sharing of feelings related to causes and symptoms of depression.  We will introduce coping skills for managing depressive symptoms.  Goals for reducing anxiety include improving his ability to better manage stress and anxiety symptoms, identify causes for anxiety and introduce ways to lower it including resolving core conflicts contributing to the anxiety.  Finally a goal for the patient will be to manage thoughts and worrisome thinking contributing to feelings of anxiety.  Interventions include providing education about anxiety to help him understand his causes and triggers, facilitate problem solution skills and coping skills for managing anxiety.  We will also use cognitive behavioral therapy to identify and change anxiety provoking thought and behavior patterns as well as dialectical behavior therapy to introduce distress tolerance and mindfulness skills. I reviewed the treatment goals as listed above with patient's agreement and extended the target date to January 30, 2024. Progress: 30%  Lorrene CHRISTELLA Hasten, [facility]                                Lorrene CHRISTELLA Hasten, [facility]               Lorrene CHRISTELLA Hasten, [facility]               Lorrene CHRISTELLA Hasten, [facility]               Lorrene CHRISTELLA Hasten, [facility]               Lorrene CHRISTELLA Hasten, [facility]               Lorrene CHRISTELLA Hasten, [facility]

## 2023-11-21 ENCOUNTER — Other Ambulatory Visit: Payer: Self-pay | Admitting: Family Medicine

## 2023-11-21 DIAGNOSIS — E785 Hyperlipidemia, unspecified: Secondary | ICD-10-CM

## 2023-11-21 DIAGNOSIS — I700 Atherosclerosis of aorta: Secondary | ICD-10-CM

## 2023-11-24 ENCOUNTER — Ambulatory Visit (INDEPENDENT_AMBULATORY_CARE_PROVIDER_SITE_OTHER)

## 2023-11-24 DIAGNOSIS — B351 Tinea unguium: Secondary | ICD-10-CM

## 2023-11-24 NOTE — Progress Notes (Signed)
 Patient presents today for the 2nd laser treatment. Diagnosed with mycotic nail infection by Dr. Sikora.   Toenail most affected left first. Completed Lamisil  a month ago. No adverse effects to report.   All other systems are negative.  Nails were filed thin. Laser therapy was administered to 1-5 toenails bilaterally and patient tolerated the treatment well. All safety precautions were in place. 3 passes on both 1st toenail, single pass on nonaffected nails.    Follow up in 4 weeks for laser # 3.

## 2023-12-06 ENCOUNTER — Encounter: Payer: Self-pay | Admitting: Family Medicine

## 2023-12-06 ENCOUNTER — Ambulatory Visit: Admitting: Family Medicine

## 2023-12-06 VITALS — BP 124/70 | HR 78 | Temp 98.8°F | Ht 69.0 in | Wt 226.4 lb

## 2023-12-06 DIAGNOSIS — F419 Anxiety disorder, unspecified: Secondary | ICD-10-CM | POA: Diagnosis not present

## 2023-12-06 NOTE — Progress Notes (Signed)
 Subjective:  Patient ID: Steven Sloan, male    DOB: 06/30/1960  Age: 63 y.o. MRN: [id number]  CC:  Chief Complaint  Patient presents with   Anxiety    Pt doing okay, no concerns expressed     HPI [name] presents for   Anxiety: Follow-up from June 23 visit.  We had increased his dose of sertraline  previously for improved control, still with breakthrough symptoms at 100 mg.  Still requiring hydroxyzine  daily, with therapist.  He had noted minimal change in control from 50 to 100 mg, decided to increase to 200 mg at that visit. Anxiety better controlled at 200mg  dose. Therapist visits every 2-3 weeks, appt tomorrow. No side effects on higher dosing. Not needing hydroxyzine  - morning flares have resolved.  Sleeping well.   Feeling good. Some side effects initially with zepbound  from weight mgt, better on lower dose.       12/06/2023    2:39 PM 10/23/2023   10:20 AM 06/23/2023    8:34 AM 05/11/2023    9:24 AM  GAD 7 : Generalized Anxiety Score  Nervous, Anxious, on Edge 0 1 1 1   Control/stop worrying 0 0 0 0  Worry too much - different things 0 1 0 1  Trouble relaxing 0 0 0 1  Restless 0 0 0 0  Easily annoyed or irritable 0 0 1 0  Afraid - awful might happen 0 0 0 1  Total GAD 7 Score 0 2 2 4   Anxiety Difficulty Not difficult at all Not difficult at all        12/06/2023    2:39 PM 10/23/2023   10:20 AM 06/23/2023    8:34 AM 05/11/2023    9:24 AM 04/10/2023    9:42 AM  Depression screen PHQ 2/9  Decreased Interest 0 0 0 0 0  Down, Depressed, Hopeless 0 0 1 1 1   PHQ - 2 Score 0 0 1 1 1   Altered sleeping 0 0 0 0 1  Tired, decreased energy 0 0 0  0  Change in appetite 0 0 1 0 0  Feeling bad or failure about yourself  0 0 0 0 0  Trouble concentrating 0 0 0 0 0  Moving slowly or fidgety/restless 0 0 0 0 0  Suicidal thoughts 0 0 0 0 0  PHQ-9 Score 0 0 2 1 2   Difficult doing work/chores Not difficult at all Not difficult at all            Lab Results  Component  Value Date   CREATININE 1.32 10/23/2023  Repeat labs last visit with decreased GFR, possible arterionephrosclerosis with favorable prognosis when he met with nephrology previously and likely component of CKD from hypertension.  Creatinine did improve from February to last visit from 1.44 down to 1.32.   History Patient Active Problem List   Diagnosis Date Noted   Nausea 11/14/2023   History of colonic polyps 04/14/2023   At increased risk for cardiovascular disease 04/04/2023   Prediabetes 04/04/2023   Decreased GFR 03/21/2023   Hyperlipidemia 03/21/2023   Hx of adenomatous colonic polyps 03/20/2023   Severe aortic stenosis 03/20/2023   Emphysema lung (HCC) 01/13/2022   Systolic murmur 01/11/2019   Class 2 severe obesity with serious comorbidity and body mass index (BMI) of 36.0 to 36.9 in adult [facility]) 08/13/2018   Prostate cancer screening 06/07/2017   Internal hemorrhoid 06/07/2017   Visit for preventive health examination 07/26/2015   Quit smoking 07/01/2015  OSA (obstructive sleep apnea) 06/10/2015   Essential hypertension, benign 06/10/2015   Past Medical History:  Diagnosis Date   Allergy    Seasonal   Anxiety    Aortic valve stenosis 08/10/2022   severe   Chicken pox    Heart murmur    Asymptomatic -- Patient endorses previous negative evaluation   High cholesterol    Hypertension    Sleep apnea    CPAP nightly   Past Surgical History:  Procedure Laterality Date   COLONOSCOPY     COLONOSCOPY WITH PROPOFOL  N/A 04/14/2023   Procedure: COLONOSCOPY WITH PROPOFOL ;  Surgeon: Wilhelmenia Aloha Raddle., MD;  Location: [facility] ENDOSCOPY;  Service: Gastroenterology;  Laterality: N/A;   POLYPECTOMY  04/14/2023   Procedure: POLYPECTOMY;  Surgeon: Wilhelmenia Aloha Raddle., MD;  Location: [facility] ENDOSCOPY;  Service: Gastroenterology;;   WISDOM TOOTH EXTRACTION     No Known Allergies Prior to Admission medications   Medication Sig Start Date End Date Taking? Authorizing Provider   aspirin EC 81 MG tablet Take 81 mg by mouth daily.   Yes [provider]  atorvastatin  (LIPITOR) 10 MG tablet TAKE 1 TABLET BY MOUTH EVERY DAY 11/21/23  Yes Levora Reyes SAUNDERS, MD  Azelastine  HCl 137 MCG/SPRAY SOLN INSTILL 1-2 SPRAYS INTO BOTH NOSTRILS 2 TIMES DAILY AS DIRECTED 08/21/23  Yes Levora Reyes SAUNDERS, MD  chlorhexidine (PERIDEX) 0.12 % solution  02/06/23  Yes [provider]  fluticasone  (FLONASE ) 50 MCG/ACT nasal spray USE 2 SPRAYS IN BOTH NOSTRILS  DAILY 05/15/23  Yes Levora Reyes SAUNDERS, MD  hydrOXYzine  (ATARAX ) 10 MG tablet TAKE 1 TABLET BY MOUTH 3 TIMES DAILY AS NEEDED FOR ANXIETY. 11/15/23  Yes Levora Reyes SAUNDERS, MD  lisinopril  (ZESTRIL ) 10 MG tablet TAKE 1 TABLET BY MOUTH DAILY 07/17/23  Yes Levora Reyes SAUNDERS, MD  Multiple Vitamins-Minerals (MULTIVITAMIN WITH MINERALS) tablet Take 1 tablet by mouth daily. 08/17/23  Yes Francyne Romano, MD  ondansetron  (ZOFRAN ) 4 MG tablet Take 1 tablet (4 mg total) by mouth every 8 (eight) hours as needed for nausea or vomiting. 11/09/23  Yes Francyne Romano, MD  psyllium (REGULOID) 0.52 g capsule Take 0.52 g by mouth daily.   Yes [provider]  sertraline  (ZOLOFT ) 100 MG tablet Take 2 tablets (200 mg total) by mouth daily. 10/23/23  Yes Levora Reyes SAUNDERS, MD  terazosin  (HYTRIN ) 1 MG capsule TAKE 1 CAPSULE BY MOUTH AT  BEDTIME 07/17/23  Yes Levora Reyes SAUNDERS, MD  tirzepatide  (ZEPBOUND ) 5 MG/0.5ML Pen Inject 5 mg into the skin once a week. 11/14/23  Yes Francyne Romano, MD   Social History   Socioeconomic History   Marital status: Married    Spouse name: Josette   Number of children: Not on file   Years of education: Not on file   Highest education level: Master's degree (e.g., MA, MS, MEng, MEd, MSW, MBA)  Occupational History   Not on file  Tobacco Use   Smoking status: Former    Current packs/day: 0.00    Average packs/day: 0.3 packs/day for 25.0 years (6.3 ttl pk-yrs)    Types: Cigarettes    Start date:  05/17/1990    Quit date: 05/18/2015    Years since quitting: 8.5   Smokeless tobacco: Never  Vaping Use   Vaping status: Never Used  Substance and Sexual Activity   Alcohol use: Yes    Alcohol/week: 2.0 - 4.0 standard drinks of alcohol    Types: 2 - 4 Standard drinks or equivalent per week  Comment: social   Drug use: Not Currently    Types: Marijuana   Sexual activity: Yes  Other Topics Concern   Not on file  Social History Narrative   Not on file   Social Drivers of Health   Financial Resource Strain: Low Risk  (12/05/2023)   Overall Financial Resource Strain (CARDIA)    Difficulty of Paying Living Expenses: Not hard at all  Food Insecurity: No Food Insecurity (12/05/2023)   Hunger Vital Sign    Worried About Running Out of Food in the Last Year: Never true    Ran Out of Food in the Last Year: Never true  Transportation Needs: No Transportation Needs (12/05/2023)   PRAPARE - Administrator, Civil Service (Medical): No    Lack of Transportation (Non-Medical): No  Physical Activity: Sufficiently Active (12/05/2023)   Exercise Vital Sign    Days of Exercise per Week: 4 days    Minutes of Exercise per Session: 60 min  Stress: No Stress Concern Present (12/05/2023)   [employer] of Occupational Health - Occupational Stress Questionnaire    Feeling of Stress: Only a little  Social Connections: Socially Isolated (12/05/2023)   Social Connection and Isolation Panel    Frequency of Communication with Friends and Family: Once a week    Frequency of Social Gatherings with Friends and Family: Once a week    Attends Religious Services: Never    Database administrator or Organizations: No    Attends Engineer, structural: Not on file    Marital Status: Married  Catering manager Violence: Not on file    Review of Systems   Objective:   Vitals:   12/06/23 1436  BP: 124/70  Pulse: 78  Temp: 98.8 F (37.1 C)  TempSrc: Temporal  SpO2: 98%  Weight: 226 lb 6.4  oz (102.7 kg)  Height: 5' 9 (1.753 m)     Physical Exam Vitals reviewed.  Constitutional:      Appearance: He is well-developed.  HENT:     Head: Normocephalic and atraumatic.  Neck:     Vascular: No carotid bruit or JVD.  Cardiovascular:     Rate and Rhythm: Normal rate and regular rhythm.     Heart sounds: Murmur (3-4/6 systolic.) heard.  Pulmonary:     Effort: Pulmonary effort is normal.     Breath sounds: Normal breath sounds. No rales.  Musculoskeletal:     Right lower leg: No edema.     Left lower leg: No edema.  Skin:    General: Skin is warm and dry.  Neurological:     Mental Status: He is alert and oriented to person, place, and time.  Psychiatric:        Mood and Affect: Mood normal.     Assessment & Plan:  Mayson Mcneish is a 63 y.o. male . Anxiety Improved control on higher dose sertraline  without any side effects.  Has not required hydroxyzine  recently.  No med changes at this time.  Continue therapy visits.  Improved tolerance of lower dose zepbound .  Continue follow-up with weight management  Physical in the next 4 to 6 months, sooner appointment if needed for new symptoms.    No orders of the defined types were placed in this encounter.  Patient Instructions  Thanks for coming in today. Glad to hear the higher dose of sertraline  is working well.  Continue to follow-up with therapist as planned.  No med changes for me at this point,  follow-up in 4 to 6 months for a physical but happy to see you sooner if needed.  Take care!    Signed,   Reyes Pines, MD [facility], [facility] 12/06/23 3:20 PM

## 2023-12-06 NOTE — Patient Instructions (Signed)
 Thanks for coming in today. Glad to hear the higher dose of sertraline  is working well.  Continue to follow-up with therapist as planned.  No med changes for me at this point, follow-up in 4 to 6 months for a physical but happy to see you sooner if needed.  Take care!

## 2023-12-07 ENCOUNTER — Encounter: Payer: Self-pay | Admitting: Behavioral Health

## 2023-12-07 ENCOUNTER — Ambulatory Visit (INDEPENDENT_AMBULATORY_CARE_PROVIDER_SITE_OTHER): Admitting: Behavioral Health

## 2023-12-07 DIAGNOSIS — F411 Generalized anxiety disorder: Secondary | ICD-10-CM

## 2023-12-07 DIAGNOSIS — F4323 Adjustment disorder with mixed anxiety and depressed mood: Secondary | ICD-10-CM

## 2023-12-07 NOTE — Progress Notes (Signed)
 [facility] Counselor/Therapist Progress Note  Patient ID: Steven Sloan, MRN: [id number],    Date: 12/07/2023  Time Spent: 2:01 PM until 2:58 PM, 57 minutes.This session was held via video teletherapy. The patient consented to the video teletherapy and was located in his home office during this session. He is aware it is the responsibility of the patient to secure confidentiality on his end of the session. The provider was in a private home office for the duration of this session.    Treatment Type: Individual Therapy  Reported Symptoms: Anxiety, stress, depression  Mental Status Exam: Appearance:  Well Groomed     Behavior: Appropriate  Motor: Normal  Speech/Language:  Normal Rate  Affect: Appropriate  Mood: normal  Thought process: normal  Thought content:   WNL  Sensory/Perceptual disturbances:   WNL  Orientation: oriented to person, place, time/date, situation, day of week, month of year, and year  Attention: Good  Concentration: Good  Memory: WNL  Fund of knowledge:  Good  Insight:   Good  Judgment:  Good  Impulse Control: Good   Risk Assessment: Danger to Self:  No Self-injurious Behavior: No Danger to Others: No Duty to Warn:no Physical Aggression / Violence:No  Access to Firearms a concern: No  Gang Involvement:No   Subjective: The patient appears to be feeling better after cutting back on his weight loss medication.  He feels that his mood has been stable with his psych meds.  He continues to use coping skills to manage his mood and says having his flowers and Gardens to work on after work but also to be able to see while he was working has been very therapeutic for him.  He has planned a lot of flowers that drawl butterflies as well as hummingbirds which is very therapeutic for him.  Work is busy but going fairly well.  We looked more at his relationship with his wife and the time they met and they have been together for 30+ years.  We talked about  some things that contributed to their relationship and where that relationship is now.  He said that his wife having medical issues over the past 5 or 6 years combined with the fact that her parents were very busy when she was young therefore she had to do very little have contributed to his frustration and that even though she can do more now physically she is doing very little around the house.  There are times she will do things when he points them out or ask but he is exhausted from trying to run their daughter different places keep up with housework and cooking as well as yard.  He is aware that he needs to come up with a chore list for both his wife and daughter encouraging him to use it so I encouraged him to have that conversation again even if he needs to giving them a chance to mind to what they want to do and can do to make him more palatable but also to spark more response from them. He does continue to practice stress reduction techniques as we have practiced some.  He does contract for safety having no thoughts of hurting himself or anyone else.  Interventions: Cognitive Behavioral Therapy and Dialectical Behavioral Therapy Diagnosis:Generalized anxiety disorder  Plan: I will meet with the patient every 2 to 3 weeks. Treatment plan: Goals are to reduce the patient's anxiety and some mild depression primarily related to medical condition but also other family factors by  at least 50% with a target date of July 31, 2023.  Goals for minimizing depression are for the patient to have less sadness as indicated by his report and PHQ-9 scores, have improved mood and return to a healthier level of functioning.  We will look at any other causes for depression in addition to those stated above.  We will explore how depression is experienced by him, use cognitive behavioral therapy to explore and replace unhealthy thought and behavior patterns contributing to depression as well as encouraging the sharing  of feelings related to causes and symptoms of depression.  We will introduce coping skills for managing depressive symptoms.  Goals for reducing anxiety include improving his ability to better manage stress and anxiety symptoms, identify causes for anxiety and introduce ways to lower it including resolving core conflicts contributing to the anxiety.  Finally a goal for the patient will be to manage thoughts and worrisome thinking contributing to feelings of anxiety.  Interventions include providing education about anxiety to help him understand his causes and triggers, facilitate problem solution skills and coping skills for managing anxiety.  We will also use cognitive behavioral therapy to identify and change anxiety provoking thought and behavior patterns as well as dialectical behavior therapy to introduce distress tolerance and mindfulness skills. I reviewed the treatment goals as listed above with patient's agreement and extended the target date to January 30, 2024. Progress: 30%  Steven Sloan, [facility]                                Steven Sloan, [facility]               Steven Sloan, [facility]               Steven Sloan, [facility]               Steven Sloan, [facility]               Steven Sloan, [facility]               Steven Sloan, [facility]               Steven Sloan, [facility]

## 2023-12-12 ENCOUNTER — Ambulatory Visit (INDEPENDENT_AMBULATORY_CARE_PROVIDER_SITE_OTHER): Admitting: Internal Medicine

## 2023-12-12 ENCOUNTER — Encounter (INDEPENDENT_AMBULATORY_CARE_PROVIDER_SITE_OTHER): Payer: Self-pay | Admitting: Internal Medicine

## 2023-12-12 VITALS — BP 116/74 | HR 63 | Temp 98.1°F | Ht 69.0 in | Wt 220.0 lb

## 2023-12-12 DIAGNOSIS — G4733 Obstructive sleep apnea (adult) (pediatric): Secondary | ICD-10-CM | POA: Diagnosis not present

## 2023-12-12 DIAGNOSIS — I10 Essential (primary) hypertension: Secondary | ICD-10-CM | POA: Diagnosis not present

## 2023-12-12 DIAGNOSIS — Z6836 Body mass index (BMI) 36.0-36.9, adult: Secondary | ICD-10-CM

## 2023-12-12 DIAGNOSIS — E66812 Obesity, class 2: Secondary | ICD-10-CM

## 2023-12-12 DIAGNOSIS — R110 Nausea: Secondary | ICD-10-CM | POA: Diagnosis not present

## 2023-12-12 DIAGNOSIS — R7303 Prediabetes: Secondary | ICD-10-CM

## 2023-12-12 MED ORDER — ONDANSETRON HCL 4 MG PO TABS: 4.0000 mg | ORAL_TABLET | Freq: Three times a day (TID) | ORAL | 0 refills | Status: DC | PRN

## 2023-12-12 MED ORDER — ZEPBOUND 7.5 MG/0.5ML ~~LOC~~ SOAJ
7.5000 mg | SUBCUTANEOUS | 0 refills | Status: DC
Start: 2023-12-12 — End: 2024-01-09

## 2023-12-12 NOTE — Assessment & Plan Note (Signed)
 Medication-induced gastrointestinal side effects Increase Zepbound  to 7.5 mg once a week Take ondansetron  4 mg every 8 hours as needed

## 2023-12-12 NOTE — Assessment & Plan Note (Signed)
 Obesity characterized by central adiposity. Initial waist circumference was 48 inches, now reduced to 46.5 inches, indicating progress. Body fat percentage decreased from 28% to a target of less than 24%. Visceral fat rating reduced from 19 to 16. Weight management complicated by night snacking habits driven by psychological factors such as boredom, stress, and habit. Current weight management includes GLP1 therapy and lifestyle modifications. Exercise is emphasized for visceral fat reduction, with a goal of 150 minutes of aerobic activity and 2-3 strength sessions weekly. - Continue Zepbound  therapy - Encourage reduction of sugary and fatty foods, particularly at night - Promote increased physical activity, aiming for 150 minutes of aerobic exercise and 2-3 strength sessions per week - Advise on dietary modifications to include more fruits with skin, vegetables, and protein-rich foods - Discuss strategies to manage cravings and reduce availability of trigger foods

## 2023-12-12 NOTE — Progress Notes (Signed)
 Office: [phone]  /  Fax: [phone]  Weight Summary and Body Composition Analysis (BIA)  Vitals Temp: 98.1 F (36.7 C) BP: 116/74 Pulse Rate: 63 SpO2: 96 %   Anthropometric Measurements Height: 5' 9 (1.753 m) Weight: 220 lb (99.8 kg) BMI (Calculated): 32.47 Weight at Last Visit: 215 lb Weight Lost Since Last Visit: 0 lb Weight Gained Since Last Visit: 5 lb Starting Weight: 247 lb Total Weight Loss (lbs): 27 lb (12.2 kg) Peak Weight: 285 lb   Body Composition  Body Fat %: 28.4 % Fat Mass (lbs): 62.6 lbs Muscle Mass (lbs): 150 lbs Total Body Water (lbs): 109.4 lbs Visceral Fat Rating : 16    RMR: 2282  Today's Visit #: 9  Starting Date: 03/21/23   Subjective   Chief Complaint: Obesity  Interval History Discussed the use of AI scribe software for clinical note transcription with the patient, who gave verbal consent to proceed.  History of Present Illness   Steven Sloan is a 63 year old male with hypertension, sleep apnea, COPD, hyperlipidemia, prediabetes, and aortic stenosis who presents for medical weight management.  He has been participating in medical weight management since November of last year. He was started on Zepbound  in February, but after increasing the dose to 10 mg, he experienced gastrointestinal side effects, leading to a reduction in dosage. Since the reduction, he has gained five pounds, totaling a weight gain of 27 pounds.  He feels 'great' overall but notes a persistent feeling in the morning that he manages by eating breakfast. He has stopped drinking coffee due to experiencing a 'real bad burn' from it, which he attributes to the caffeine and alcohol content. He acknowledges that the medication slows digestion, causing food to sit longer in his stomach.  He struggles with night snacking, particularly craving sweet foods like [place] ices and ice cream around 9 PM. He likens this craving to an addiction, driven by boredom,  habit, and stress. He is a night owl, often feeling tired around 9 PM, which triggers his snacking habit. He recognizes this as a psychological craving rather than hunger.  He has noticed changes in his pant size, indicating a reduction from a size 42 to potentially a size 39. His waist circumference has decreased from 48 inches to 46.5 inches, and his body fat percentage has reduced to 28%. Visceral fat has decreased from a rating of 19 to 16.  He is mindful of his diet, avoiding greasy, fatty, sugary, and spicy foods to prevent stomach upset. He consumes a lot of protein, broccoli, cauliflower, and brown rice, and is trying to increase his intake of fruits and vegetables. He drinks alcohol, typically wine, on Saturday nights and avoids drinking alone.  He uses a CPAP machine for sleep apnea and reports good adherence. He is also using Zepbound  to help manage his prediabetes.       Challenges affecting patient progress: strong hunger signals and/or impaired satiety / inhibitory control and a degree of intolerance to GLP-1.    Pharmacotherapy for weight management: He is currently taking Zepbound  with adequate clinical response  and without side effects..   Assessment and Plan   Treatment Plan For Obesity:  Recommended Dietary Goals  Steven Sloan is currently in the action stage of change. As such, his goal is to continue weight management plan. He has agreed to: continue current plan.  We discussed nutritional strategies to avoid side effects on GLP-1  Behavioral Health and Counseling  We discussed the following  behavioral modification strategies today: continue to work on maintaining a reduced calorie state, getting the recommended amount of protein, incorporating whole foods, making healthy choices, staying well hydrated and practicing mindfulness when eating..  Additional education and resources provided today: None  Recommended Physical Activity Goals  Steven Sloan has been advised to work  up to 150 minutes of moderate intensity aerobic activity a week and strengthening exercises 2-3 times per week for cardiovascular health, weight loss maintenance and preservation of muscle mass.   He has agreed to :  Increase the intensity, frequency or duration of strengthening exercises  and Increase the intensity, frequency or duration of aerobic exercises    Medical Interventions and Pharmacotherapy  We discussed various medication options to help Steven Sloan with his weight loss efforts and we both agreed to : Increase Zepbound  to 7.5 mg once a week.  Provided with nutritional counseling to avoid amplification of GLP-1 side effects  Associated Conditions Impacted by Obesity Treatment  Assessment & Plan Essential hypertension, benign His blood pressure is now better.  He is on terazosin  and lisinopril  managed by primary care team.  We are increasing Zepbound  so he will monitor for orthostasis. OSA (obstructive sleep apnea) Severe OSA per history.  On CPAP with reported good compliance. Continue PAP therapy.  Losing 15% of body weight may reduce AHI.  Now on Zepbound  will increase medication to 7.5 mg once a week Prediabetes He has had 2 consecutive fasting blood sugars above 100 with normal range hemoglobin A1c and mildly elevated insulin  levels consistent with mild insulin  resistance. Prediabetes managed with GLP1 receptor agonist therapy, which also aids in weight management. Dietary modifications discussed to limit carbohydrate intake to a quarter of the plate, focusing on whole grains and legumes. - Continue Zepbound  therapy for weight and glucose management - Advise dietary modifications to limit carbohydrates and increase intake of whole grains and legumes      Nausea Medication-induced gastrointestinal side effects Increase Zepbound  to 7.5 mg once a week Take ondansetron  4 mg every 8 hours as needed Class 2 severe obesity with serious comorbidity and body mass index (BMI) of 36.0 to  36.9 in adult, unspecified obesity type (HCC) Obesity characterized by central adiposity. Initial waist circumference was 48 inches, now reduced to 46.5 inches, indicating progress. Body fat percentage decreased from 28% to a target of less than 24%. Visceral fat rating reduced from 19 to 16. Weight management complicated by night snacking habits driven by psychological factors such as boredom, stress, and habit. Current weight management includes GLP1 therapy and lifestyle modifications. Exercise is emphasized for visceral fat reduction, with a goal of 150 minutes of aerobic activity and 2-3 strength sessions weekly. - Continue Zepbound  therapy - Encourage reduction of sugary and fatty foods, particularly at night - Promote increased physical activity, aiming for 150 minutes of aerobic exercise and 2-3 strength sessions per week - Advise on dietary modifications to include more fruits with skin, vegetables, and protein-rich foods - Discuss strategies to manage cravings and reduce availability of trigger foods          Objective   Physical Exam:  Blood pressure 116/74, pulse 63, temperature 98.1 F (36.7 C), height 5' 9 (1.753 m), weight 220 lb (99.8 kg), SpO2 96%. Body mass index is 32.49 kg/m.  General: He is overweight, cooperative, alert, well developed, and in no acute distress. PSYCH: Has normal mood, affect and thought process.   HEENT: EOMI, sclerae are anicteric. Lungs: Normal breathing effort, no conversational dyspnea. Extremities: No  edema.  Neurologic: No gross sensory or motor deficits. No tremors or fasciculations noted.    Diagnostic Data Reviewed:  BMET    Component Value Date/Time   NA 138 10/23/2023 1022   K 4.6 10/23/2023 1022   CL 101 10/23/2023 1022   CO2 25 10/23/2023 1022   GLUCOSE 75 10/23/2023 1022   BUN 25 (H) 10/23/2023 1022   CREATININE 1.32 10/23/2023 1022   CALCIUM  10.3 10/23/2023 1022   GFRNONAA >60 04/09/2015 1140   GFRAA >60 04/09/2015  1140   Lab Results  Component Value Date   HGBA1C 5.3 03/21/2023   HGBA1C 5.0 07/22/2015   Lab Results  Component Value Date   INSULIN  12.6 03/21/2023   Lab Results  Component Value Date   TSH 0.78 04/10/2023   CBC    Component Value Date/Time   WBC 6.4 04/10/2023 1034   RBC 5.11 04/10/2023 1034   HGB 15.9 04/10/2023 1034   HCT 46.9 04/10/2023 1034   PLT 224.0 04/10/2023 1034   MCV 91.8 04/10/2023 1034   MCH 31.8 04/09/2015 1140   MCHC 34.0 04/10/2023 1034   RDW 12.9 04/10/2023 1034   Iron Studies No results found for: IRON, TIBC, FERRITIN, IRONPCTSAT Lipid Panel     Component Value Date/Time   CHOL 115 04/10/2023 1034   TRIG 78.0 04/10/2023 1034   HDL 45.40 04/10/2023 1034   CHOLHDL 3 04/10/2023 1034   VLDL 15.6 04/10/2023 1034   LDLCALC 54 04/10/2023 1034   Hepatic Function Panel     Component Value Date/Time   PROT 7.8 10/23/2023 1022   ALBUMIN 4.7 10/23/2023 1022   AST 24 10/23/2023 1022   ALT 28 10/23/2023 1022   ALKPHOS 48 10/23/2023 1022   BILITOT 1.0 10/23/2023 1022   BILIDIR 0.2 12/07/2022 0933      Component Value Date/Time   TSH 0.78 04/10/2023 1034   Nutritional Lab Results  Component Value Date   VD25OH 40.9 03/21/2023    Medications: Outpatient Encounter Medications as of 12/12/2023  Medication Sig   aspirin EC 81 MG tablet Take 81 mg by mouth daily.   atorvastatin  (LIPITOR) 10 MG tablet TAKE 1 TABLET BY MOUTH EVERY DAY   Azelastine  HCl 137 MCG/SPRAY SOLN INSTILL 1-2 SPRAYS INTO BOTH NOSTRILS 2 TIMES DAILY AS DIRECTED   chlorhexidine (PERIDEX) 0.12 % solution    fluticasone  (FLONASE ) 50 MCG/ACT nasal spray USE 2 SPRAYS IN BOTH NOSTRILS  DAILY   hydrOXYzine  (ATARAX ) 10 MG tablet TAKE 1 TABLET BY MOUTH 3 TIMES DAILY AS NEEDED FOR ANXIETY.   lisinopril  (ZESTRIL ) 10 MG tablet TAKE 1 TABLET BY MOUTH DAILY   Multiple Vitamins-Minerals (MULTIVITAMIN WITH MINERALS) tablet Take 1 tablet by mouth daily.   psyllium (REGULOID) 0.52 g  capsule Take 0.52 g by mouth daily.   sertraline  (ZOLOFT ) 100 MG tablet Take 2 tablets (200 mg total) by mouth daily.   terazosin  (HYTRIN ) 1 MG capsule TAKE 1 CAPSULE BY MOUTH AT  BEDTIME   tirzepatide  (ZEPBOUND ) 7.5 MG/0.5ML Pen Inject 7.5 mg into the skin once a week.   [DISCONTINUED] ondansetron  (ZOFRAN ) 4 MG tablet Take 1 tablet (4 mg total) by mouth every 8 (eight) hours as needed for nausea or vomiting.   [DISCONTINUED] tirzepatide  (ZEPBOUND ) 5 MG/0.5ML Pen Inject 5 mg into the skin once a week.   ondansetron  (ZOFRAN ) 4 MG tablet Take 1 tablet (4 mg total) by mouth every 8 (eight) hours as needed for nausea or vomiting.   No facility-administered encounter medications on  file as of 12/12/2023.     Follow-Up   Return in about 4 weeks (around 01/09/2024) for For Weight Mangement with Dr. Francyne.SABRA He was informed of the importance of frequent follow up visits to maximize his success with intensive lifestyle modifications for his multiple health conditions.  Attestation Statement   Reviewed by clinician on day of visit: allergies, medications, problem list, medical history, surgical history, family history, social history, and previous encounter notes.     Lucas Francyne, MD

## 2023-12-12 NOTE — Assessment & Plan Note (Signed)
 His blood pressure is now better.  He is on terazosin  and lisinopril  managed by primary care team.  We are increasing Zepbound  so he will monitor for orthostasis.

## 2023-12-12 NOTE — Assessment & Plan Note (Signed)
 Severe OSA per history.  On CPAP with reported good compliance. Continue PAP therapy.  Losing 15% of body weight may reduce AHI.  Now on Zepbound  will increase medication to 7.5 mg once a week

## 2023-12-12 NOTE — Assessment & Plan Note (Signed)
 He has had 2 consecutive fasting blood sugars above 100 with normal range hemoglobin A1c and mildly elevated insulin  levels consistent with mild insulin  resistance. Prediabetes managed with GLP1 receptor agonist therapy, which also aids in weight management. Dietary modifications discussed to limit carbohydrate intake to a quarter of the plate, focusing on whole grains and legumes. - Continue Zepbound  therapy for weight and glucose management - Advise dietary modifications to limit carbohydrates and increase intake of whole grains and legumes

## 2023-12-20 ENCOUNTER — Encounter: Payer: Self-pay | Admitting: Behavioral Health

## 2023-12-20 ENCOUNTER — Ambulatory Visit (INDEPENDENT_AMBULATORY_CARE_PROVIDER_SITE_OTHER): Admitting: Behavioral Health

## 2023-12-20 DIAGNOSIS — F411 Generalized anxiety disorder: Secondary | ICD-10-CM | POA: Diagnosis not present

## 2023-12-20 DIAGNOSIS — F4323 Adjustment disorder with mixed anxiety and depressed mood: Secondary | ICD-10-CM

## 2023-12-20 NOTE — Progress Notes (Signed)
 [facility] Counselor/Therapist Progress Note  Patient ID: Cord Wilczynski, MRN: [id number],    Date: 12/20/2023  Time Spent: 3:01 PM until 3:58 PM, 57 minutes.This session was held via video teletherapy. The patient consented to the video teletherapy and was located in his home office during this session. He is aware it is the responsibility of the patient to secure confidentiality on his end of the session. The provider was in a private home office for the duration of this session.    Treatment Type: Individual Therapy  Reported Symptoms: Anxiety, stress, depression  Mental Status Exam: Appearance:  Well Groomed     Behavior: Appropriate  Motor: Normal  Speech/Language:  Normal Rate  Affect: Appropriate  Mood: normal  Thought process: normal  Thought content:   WNL  Sensory/Perceptual disturbances:   WNL  Orientation: oriented to person, place, time/date, situation, day of week, month of year, and year  Attention: Good  Concentration: Good  Memory: WNL  Fund of knowledge:  Good  Insight:   Good  Judgment:  Good  Impulse Control: Good   Risk Assessment: Danger to Self:  No Self-injurious Behavior: No Danger to Others: No Duty to Warn:no Physical Aggression / Violence:No  Access to Firearms a concern: No  Gang Involvement:No   Subjective: The patient appears to be feeling better after cutting back on his weight loss medication.   He is progressing with his weight loss and says he still about 40+ pounds from his target goal but feels he is making steady progress.  It is a balance trying to find the right amount of intake so that he does not get nauseous or uncomfortable.  He needs to balance on a lot of responsibility especially at home.  There has been a small amount poor participation from his wife and daughter but he says his wife still likes to go to her own space and does not look for anything to do to help him.  He tends to be the primary one of the family  to take his daughter to things.  He is doing the best that he can to balance all the responsibilities that he has and I encouraged him to continue to push for his daughter and wife to do more to help without especially as he knows that in the near future he will have heart surgery.  Getting out in his yard more and enjoying his flowers.  He is walking the dog at night now that the weather has cooled down.  He continues to eat healthy and is practicing relaxation techniques and things that he has found benefit from in terms of minimizing his anxiety.  He does continue to practice stress reduction techniques as we have practiced some.  He does contract for safety having no thoughts of hurting himself or anyone else.  Interventions: Cognitive Behavioral Therapy and Dialectical Behavioral Therapy Diagnosis:Generalized anxiety disorder  Plan: I will meet with the patient every 2 to 3 weeks. Treatment plan: Goals are to reduce the patient's anxiety and some mild depression primarily related to medical condition but also other family factors by at least 50% with a target date of July 31, 2023.  Goals for minimizing depression are for the patient to have less sadness as indicated by his report and PHQ-9 scores, have improved mood and return to a healthier level of functioning.  We will look at any other causes for depression in addition to those stated above.  We will explore how depression is experienced  by him, use cognitive behavioral therapy to explore and replace unhealthy thought and behavior patterns contributing to depression as well as encouraging the sharing of feelings related to causes and symptoms of depression.  We will introduce coping skills for managing depressive symptoms.  Goals for reducing anxiety include improving his ability to better manage stress and anxiety symptoms, identify causes for anxiety and introduce ways to lower it including resolving core conflicts contributing to the  anxiety.  Finally a goal for the patient will be to manage thoughts and worrisome thinking contributing to feelings of anxiety.  Interventions include providing education about anxiety to help him understand his causes and triggers, facilitate problem solution skills and coping skills for managing anxiety.  We will also use cognitive behavioral therapy to identify and change anxiety provoking thought and behavior patterns as well as dialectical behavior therapy to introduce distress tolerance and mindfulness skills. I reviewed the treatment goals as listed above with patient's agreement and extended the target date to January 30, 2024. Progress: 30%  Lorrene CHRISTELLA Hasten, [facility]                                Lorrene CHRISTELLA Hasten, [facility]               Lorrene CHRISTELLA Hasten, [facility]               Lorrene CHRISTELLA Hasten, [facility]               Lorrene CHRISTELLA Hasten, [facility]               Lorrene CHRISTELLA Hasten, [facility]               Lorrene CHRISTELLA Hasten, [facility]               Lorrene CHRISTELLA Hasten, [facility]               Lorrene CHRISTELLA Hasten, [facility]

## 2024-01-02 ENCOUNTER — Other Ambulatory Visit: Payer: Self-pay | Admitting: Family Medicine

## 2024-01-05 ENCOUNTER — Ambulatory Visit (INDEPENDENT_AMBULATORY_CARE_PROVIDER_SITE_OTHER)

## 2024-01-05 DIAGNOSIS — B351 Tinea unguium: Secondary | ICD-10-CM

## 2024-01-05 NOTE — Progress Notes (Signed)
 Patient presents today for the 3rd laser treatment. Diagnosed with mycotic nail infection by Dr. Sikora.   Toenail most affected left 1st. Nails are clearing up for the most part, the left 1st nail has some discoloration still present.  All other systems are negative.  Nails were filed thin. Laser therapy was administered to 1-5 toenails bilaterally and patient tolerated the treatment well. All safety precautions were in place. 3 passes on bilateral 1st nails and single passes on the remaining nails.    Follow up in 6 weeks for laser # 4.

## 2024-01-09 ENCOUNTER — Encounter (INDEPENDENT_AMBULATORY_CARE_PROVIDER_SITE_OTHER): Payer: Self-pay | Admitting: Internal Medicine

## 2024-01-09 ENCOUNTER — Ambulatory Visit (INDEPENDENT_AMBULATORY_CARE_PROVIDER_SITE_OTHER): Admitting: Internal Medicine

## 2024-01-09 DIAGNOSIS — R7303 Prediabetes: Secondary | ICD-10-CM

## 2024-01-09 DIAGNOSIS — I10 Essential (primary) hypertension: Secondary | ICD-10-CM | POA: Diagnosis not present

## 2024-01-09 DIAGNOSIS — E66812 Obesity, class 2: Secondary | ICD-10-CM | POA: Diagnosis not present

## 2024-01-09 DIAGNOSIS — G4733 Obstructive sleep apnea (adult) (pediatric): Secondary | ICD-10-CM | POA: Diagnosis not present

## 2024-01-09 DIAGNOSIS — Z6836 Body mass index (BMI) 36.0-36.9, adult: Secondary | ICD-10-CM

## 2024-01-09 MED ORDER — ZEPBOUND 7.5 MG/0.5ML ~~LOC~~ SOAJ: 7.5000 mg | SUBCUTANEOUS | 0 refills | Status: DC

## 2024-01-09 NOTE — Progress Notes (Signed)
 Office: [phone]  /  Fax: [phone]  Weight Summary and Body Composition Analysis (BIA)  Vitals Temp: 97.6 F (36.4 C) BP: 118/73 Pulse Rate: (!) 57 SpO2: 97 %   Anthropometric Measurements Height: 5' 9 (1.753 m) Weight: 221 lb (100.2 kg) BMI (Calculated): 32.62 Weight at Last Visit: 220 lb Weight Lost Since Last Visit: 0 lb Weight Gained Since Last Visit: 1 lb Starting Weight: 247 lb Total Weight Loss (lbs): 26 lb (11.8 kg) Peak Weight: 285 lb   Body Composition  Body Fat %: 28.2 % Fat Mass (lbs): 62.4 lbs Muscle Mass (lbs): 151.4 lbs Total Body Water (lbs): 107.2 lbs Visceral Fat Rating : 16    RMR: 2282  Today's Visit #: 10  Starting Date: 03/21/23   Subjective   Chief Complaint: Obesity  Interval History Discussed the use of AI scribe software for clinical note transcription with the patient, who gave verbal consent to proceed.  History of Present Illness Steven Sloan is a 63 year old male with prediabetes who presents for medical management of his weight.  He has experienced a slight weight gain of one pound, which he attributes to multiple celebratory events in August, including several birthday parties and Labor Day. He follows an 1800 calorie diet about 80% of the time and tracks his protein intake, ensuring he consumes 30 to 40 grams of protein per meal. He uses [employer] protein shakes, which contain 42 grams of protein, as a meal replacement or to supplement his intake when needed.  He experiences mild nausea in the mornings, which he manages with a nausea pill. The nausea is manageable and does not affect his quality of life. His medication dose was adjusted from ten to seven and a half due to previous stomach issues.  He has good appetite control and does not feel the need to eat out of hunger, but rather out of boredom, especially when watching TV. He manages this by engaging in activities like gardening or walking. He has  resumed walking at night as the weather has cooled, which he finds beneficial for digestion and appetite control.  He has a history of smoking but has quit.     Challenges affecting patient progress: strong hunger signals and/or impaired satiety / inhibitory control, having difficulty focusing on healthy eating, exposure to enticing environments and/or relationships, and inability to tolerate higher dose of Zepbound .    Pharmacotherapy for weight management: He is currently taking Zepbound  with adequate clinical response  and experiencing the following side effects: nausea.   Assessment and Plan   Treatment Plan For Obesity:  Recommended Dietary Goals  Steven Sloan is currently in the action stage of change. As such, his goal is to continue weight management plan. He has agreed to: continue current plan  Behavioral Health and Counseling  We discussed the following behavioral modification strategies today: increasing lean protein intake to established goals, decreasing simple carbohydrates , increasing fiber rich foods, increasing water intake , practice mindfulness eating and understand the difference between hunger signals and cravings, and avoiding temptations and identifying enticing environmental cues.  Additional education and resources provided today: None  Recommended Physical Activity Goals  Steven Sloan has been advised to work up to 150 minutes of moderate intensity aerobic activity a week and strengthening exercises 2-3 times per week for cardiovascular health, weight loss maintenance and preservation of muscle mass.  He has agreed to :  Continue to gradually increase the amount and intensity of exercise routine, Increase volume of physical  activity to a goal of 240 minutes a week, and Combine aerobic and strengthening exercises for efficiency and improved cardiometabolic health.  Medical Interventions and Pharmacotherapy  We discussed various medication options to help Steven Sloan with  his weight loss efforts and we both agreed to : Adequate clinical response to anti-obesity medication, continue current regimen and do not recommend further increases in GLP-1 due to gastrointestinal side effects  Associated Conditions Impacted by Obesity Treatment  Assessment & Plan Essential hypertension, benign Blood pressure has improved he is currently on terazosin  and lisinopril .  Continue current regimen monitor for orthostasis.  Zepbound  has also helped with blood pressure control OSA (obstructive sleep apnea) Severe OSA per history.  On CPAP with reported good compliance. Continue PAP therapy.  Losing 15% of body weight may reduce AHI.  Continue GLP-1 aided weight loss Prediabetes He has had 2 consecutive fasting blood sugars above 100 with normal range hemoglobin A1c and mildly elevated insulin  levels consistent with mild insulin  resistance. Prediabetes managed with GLP1 receptor agonist therapy, which also aids in weight management. Dietary modifications discussed to limit carbohydrate and maintaining a low glycemic load. - Continue Zepbound  therapy for weight and glucose management - Advise dietary modifications to limit carbohydrates      Class 2 severe obesity with serious comorbidity and body mass index (BMI) of 36.0 to 36.9 in adult, unspecified obesity type (HCC) Weight increased by one pound, considered stable given recent celebratory events. Nausea present in the morning but manageable with medication. Appetite control is good, with occasional boredom-related eating. Engaging in physical activity such as walking, which aids in digestion and metabolism. Current medication dose is effective without causing vomiting. - Continue current medication dose - Encourage adherence to 1800 calorie diet with 30-40 grams of protein per meal - Advise to avoid highly palatable foods to prevent cravings - Encourage post-meal walks to aid digestion and metabolism - Send medication refill to  [facility]         Objective   Physical Exam:  Blood pressure 118/73, pulse (!) 57, temperature 97.6 F (36.4 C), height 5' 9 (1.753 m), weight 221 lb (100.2 kg), SpO2 97%. Body mass index is 32.64 kg/m.  General: He is overweight, cooperative, alert, well developed, and in no acute distress. PSYCH: Has normal mood, affect and thought process.   HEENT: EOMI, sclerae are anicteric. Lungs: Normal breathing effort, no conversational dyspnea. Extremities: No edema.  Neurologic: No gross sensory or motor deficits. No tremors or fasciculations noted.    Diagnostic Data Reviewed:  BMET    Component Value Date/Time   NA 138 10/23/2023 1022   K 4.6 10/23/2023 1022   CL 101 10/23/2023 1022   CO2 25 10/23/2023 1022   GLUCOSE 75 10/23/2023 1022   BUN 25 (H) 10/23/2023 1022   CREATININE 1.32 10/23/2023 1022   CALCIUM  10.3 10/23/2023 1022   GFRNONAA >60 04/09/2015 1140   GFRAA >60 04/09/2015 1140   Lab Results  Component Value Date   HGBA1C 5.3 03/21/2023   HGBA1C 5.0 07/22/2015   Lab Results  Component Value Date   INSULIN  12.6 03/21/2023   Lab Results  Component Value Date   TSH 0.78 04/10/2023   CBC    Component Value Date/Time   WBC 6.4 04/10/2023 1034   RBC 5.11 04/10/2023 1034   HGB 15.9 04/10/2023 1034   HCT 46.9 04/10/2023 1034   PLT 224.0 04/10/2023 1034   MCV 91.8 04/10/2023 1034   MCH 31.8 04/09/2015 1140   MCHC  34.0 04/10/2023 1034   RDW 12.9 04/10/2023 1034   Iron Studies No results found for: IRON, TIBC, FERRITIN, IRONPCTSAT Lipid Panel     Component Value Date/Time   CHOL 115 04/10/2023 1034   TRIG 78.0 04/10/2023 1034   HDL 45.40 04/10/2023 1034   CHOLHDL 3 04/10/2023 1034   VLDL 15.6 04/10/2023 1034   LDLCALC 54 04/10/2023 1034   Hepatic Function Panel     Component Value Date/Time   PROT 7.8 10/23/2023 1022   ALBUMIN 4.7 10/23/2023 1022   AST 24 10/23/2023 1022   ALT 28 10/23/2023 1022   ALKPHOS 48 10/23/2023  1022   BILITOT 1.0 10/23/2023 1022   BILIDIR 0.2 12/07/2022 0933      Component Value Date/Time   TSH 0.78 04/10/2023 1034   Nutritional Lab Results  Component Value Date   VD25OH 40.9 03/21/2023    Medications: Outpatient Encounter Medications as of 01/09/2024  Medication Sig   aspirin EC 81 MG tablet Take 81 mg by mouth daily.   atorvastatin  (LIPITOR) 10 MG tablet TAKE 1 TABLET BY MOUTH EVERY DAY   Azelastine  HCl 137 MCG/SPRAY SOLN INSTILL 1-2 SPRAYS INTO BOTH NOSTRILS 2 TIMES DAILY AS DIRECTED   chlorhexidine (PERIDEX) 0.12 % solution    fluticasone  (FLONASE ) 50 MCG/ACT nasal spray USE 2 SPRAYS IN BOTH NOSTRILS  DAILY   hydrOXYzine  (ATARAX ) 10 MG tablet TAKE 1 TABLET BY MOUTH 3 TIMES DAILY AS NEEDED FOR ANXIETY.   lisinopril  (ZESTRIL ) 10 MG tablet TAKE 1 TABLET BY MOUTH DAILY   Multiple Vitamins-Minerals (MULTIVITAMIN WITH MINERALS) tablet Take 1 tablet by mouth daily.   ondansetron  (ZOFRAN ) 4 MG tablet Take 1 tablet (4 mg total) by mouth every 8 (eight) hours as needed for nausea or vomiting.   psyllium (REGULOID) 0.52 g capsule Take 0.52 g by mouth daily.   sertraline  (ZOLOFT ) 100 MG tablet Take 2 tablets (200 mg total) by mouth daily.   terazosin  (HYTRIN ) 1 MG capsule TAKE 1 CAPSULE BY MOUTH AT  BEDTIME   [DISCONTINUED] tirzepatide  (ZEPBOUND ) 7.5 MG/0.5ML Pen Inject 7.5 mg into the skin once a week.   tirzepatide  (ZEPBOUND ) 7.5 MG/0.5ML Pen Inject 7.5 mg into the skin once a week.   No facility-administered encounter medications on file as of 01/09/2024.     Follow-Up   Return in about 4 weeks (around 02/06/2024) for For Weight Mangement with Dr. Francyne.SABRA He was informed of the importance of frequent follow up visits to maximize his success with intensive lifestyle modifications for his multiple health conditions.  Attestation Statement   Reviewed by clinician on day of visit: allergies, medications, problem list, medical history, surgical history, family history, social  history, and previous encounter notes.     Lucas Francyne, MD

## 2024-01-09 NOTE — Assessment & Plan Note (Signed)
 Severe OSA per history.  On CPAP with reported good compliance. Continue PAP therapy.  Losing 15% of body weight may reduce AHI.  Continue GLP-1 aided weight loss

## 2024-01-09 NOTE — Assessment & Plan Note (Signed)
 Blood pressure has improved he is currently on terazosin  and lisinopril .  Continue current regimen monitor for orthostasis.  Zepbound  has also helped with blood pressure control

## 2024-01-09 NOTE — Assessment & Plan Note (Signed)
 He has had 2 consecutive fasting blood sugars above 100 with normal range hemoglobin A1c and mildly elevated insulin  levels consistent with mild insulin  resistance. Prediabetes managed with GLP1 receptor agonist therapy, which also aids in weight management. Dietary modifications discussed to limit carbohydrate and maintaining a low glycemic load. - Continue Zepbound  therapy for weight and glucose management - Advise dietary modifications to limit carbohydrates

## 2024-01-09 NOTE — Assessment & Plan Note (Signed)
 Weight increased by one pound, considered stable given recent celebratory events. Nausea present in the morning but manageable with medication. Appetite control is good, with occasional boredom-related eating. Engaging in physical activity such as walking, which aids in digestion and metabolism. Current medication dose is effective without causing vomiting. - Continue current medication dose - Encourage adherence to 1800 calorie diet with 30-40 grams of protein per meal - Advise to avoid highly palatable foods to prevent cravings - Encourage post-meal walks to aid digestion and metabolism - Send medication refill to [facility]

## 2024-01-10 ENCOUNTER — Ambulatory Visit (INDEPENDENT_AMBULATORY_CARE_PROVIDER_SITE_OTHER): Admitting: Behavioral Health

## 2024-01-10 ENCOUNTER — Encounter: Payer: Self-pay | Admitting: Cardiovascular Disease

## 2024-01-10 ENCOUNTER — Encounter: Payer: Self-pay | Admitting: Behavioral Health

## 2024-01-10 DIAGNOSIS — F411 Generalized anxiety disorder: Secondary | ICD-10-CM

## 2024-01-10 DIAGNOSIS — F4323 Adjustment disorder with mixed anxiety and depressed mood: Secondary | ICD-10-CM

## 2024-01-10 NOTE — Progress Notes (Signed)
 [facility] Counselor/Therapist Progress Note  Patient ID: Keary Waterson, MRN: [id number],    Date: 01/10/2024  Time Spent: 3:01 PM until 3:59 PM, 58 minutes.This session was held via video teletherapy. The patient consented to the video teletherapy and was located in his home office during this session. He is aware it is the responsibility of the patient to secure confidentiality on his end of the session. The provider was in a private home office for the duration of this session.   His garden and yard continue to be a great source of therapy for him any issues and other things including sensory skills we have worked on including a music, white noise etc.  He does contract for safety having no thoughts of hurting himself or anyone else. Treatment Type: Individual Therapy  Reported Symptoms: Anxiety, stress, depression  Mental Status Exam: Appearance:  Well Groomed     Behavior: Appropriate  Motor: Normal  Speech/Language:  Normal Rate  Affect: Appropriate  Mood: normal  Thought process: normal  Thought content:   WNL  Sensory/Perceptual disturbances:   WNL  Orientation: oriented to person, place, time/date, situation, day of week, month of year, and year  Attention: Good  Concentration: Good  Memory: WNL  Fund of knowledge:  Good  Insight:   Good  Judgment:  Good  Impulse Control: Good   Risk Assessment: Danger to Self:  No Self-injurious Behavior: No Danger to Others: No Duty to Warn:no Physical Aggression / Violence:No  Access to Firearms a concern: No  Gang Involvement:No   Subjective: The patient has temporarily plateaued with his weight loss but said that the adjusting of the dosage as expected.  At the dosage he is at he is not having nearly as much discomfort and nausea etc.  Reports that for the most part physically he feels pretty good and has not had any specific heart related issues which she is thankful for.  He is concerned about choices that  his sister is making but also how her choices are impacting his parents as his sister lives with them.  He recognizes that he can only be supportive and that she has to choose to make better decisions.  He is still getting minimal help at home.  Continues to try to find various ways to get his wife and daughter more engaged in helping out around the house especially as he is approaching the busy time with his daughter going back to school to be involved in school and other activities.  He does continue to practice stress reduction techniques as we have practiced some.  He does contract for safety having no thoughts of hurting himself or anyone else.  Interventions: Cognitive Behavioral Therapy and Dialectical Behavioral Therapy Diagnosis:Generalized anxiety disorder  Plan: I will meet with the patient every 2 to 3 weeks. Treatment plan: Goals are to reduce the patient's anxiety and some mild depression primarily related to medical condition but also other family factors by at least 50% with a target date of July 31, 2023.  Goals for minimizing depression are for the patient to have less sadness as indicated by his report and PHQ-9 scores, have improved mood and return to a healthier level of functioning.  We will look at any other causes for depression in addition to those stated above.  We will explore how depression is experienced by him, use cognitive behavioral therapy to explore and replace unhealthy thought and behavior patterns contributing to depression as well as encouraging the sharing of  feelings related to causes and symptoms of depression.  We will introduce coping skills for managing depressive symptoms.  Goals for reducing anxiety include improving his ability to better manage stress and anxiety symptoms, identify causes for anxiety and introduce ways to lower it including resolving core conflicts contributing to the anxiety.  Finally a goal for the patient will be to manage thoughts and  worrisome thinking contributing to feelings of anxiety.  Interventions include providing education about anxiety to help him understand his causes and triggers, facilitate problem solution skills and coping skills for managing anxiety.  We will also use cognitive behavioral therapy to identify and change anxiety provoking thought and behavior patterns as well as dialectical behavior therapy to introduce distress tolerance and mindfulness skills. I reviewed the treatment goals as listed above with patient's agreement and extended the target date to July 30, 2024 Progress: 30%  Lorrene CHRISTELLA Hasten, [facility]                                Lorrene CHRISTELLA Hasten, [facility]               Lorrene CHRISTELLA Hasten, [facility]               Lorrene CHRISTELLA Hasten, [facility]               Lorrene CHRISTELLA Hasten, [facility]               Lorrene CHRISTELLA Hasten, [facility]               Lorrene CHRISTELLA Hasten, [facility]               Lorrene CHRISTELLA Hasten, [facility]               Lorrene CHRISTELLA Hasten, [facility]               Lorrene CHRISTELLA Hasten, [facility]

## 2024-01-24 ENCOUNTER — Ambulatory Visit: Admitting: Behavioral Health

## 2024-01-25 ENCOUNTER — Other Ambulatory Visit (INDEPENDENT_AMBULATORY_CARE_PROVIDER_SITE_OTHER): Payer: Self-pay | Admitting: Internal Medicine

## 2024-01-25 DIAGNOSIS — R110 Nausea: Secondary | ICD-10-CM

## 2024-02-06 ENCOUNTER — Encounter (INDEPENDENT_AMBULATORY_CARE_PROVIDER_SITE_OTHER): Payer: Self-pay | Admitting: Internal Medicine

## 2024-02-06 ENCOUNTER — Ambulatory Visit (INDEPENDENT_AMBULATORY_CARE_PROVIDER_SITE_OTHER): Admitting: Internal Medicine

## 2024-02-06 DIAGNOSIS — I10 Essential (primary) hypertension: Secondary | ICD-10-CM | POA: Diagnosis not present

## 2024-02-06 DIAGNOSIS — E66812 Obesity, class 2: Secondary | ICD-10-CM

## 2024-02-06 DIAGNOSIS — Z6836 Body mass index (BMI) 36.0-36.9, adult: Secondary | ICD-10-CM

## 2024-02-06 DIAGNOSIS — G4733 Obstructive sleep apnea (adult) (pediatric): Secondary | ICD-10-CM | POA: Diagnosis not present

## 2024-02-06 DIAGNOSIS — R7303 Prediabetes: Secondary | ICD-10-CM

## 2024-02-06 MED ORDER — ZEPBOUND 7.5 MG/0.5ML ~~LOC~~ SOAJ: 7.5000 mg | SUBCUTANEOUS | 0 refills | Status: DC

## 2024-02-06 NOTE — Assessment & Plan Note (Signed)
 Asymptomatic.  Currently on Zepbound  for pharmacoprophylaxis.  Continue current weight management strategy

## 2024-02-06 NOTE — Assessment & Plan Note (Signed)
 Severe OSA per history.  On CPAP with reported good compliance. Continue PAP therapy.  Losing 15% of body weight may reduce AHI.  Continue GLP-1 aided weight loss

## 2024-02-06 NOTE — Assessment & Plan Note (Signed)
 Blood pressure has improved.  Currently on terazosin  and lisinopril .  Tirzepatide  also helps with blood pressure control.  Continue current regimen

## 2024-02-06 NOTE — Assessment & Plan Note (Addendum)
 Weight: decrease of 65 lb (23%) over 2 years, 10 months  Start: 03/11/2021 283 lb (128.4 kg)  End: 02/06/2024 218 lb (98.9 kg)  Current weight loss of three pounds since the last visit. Adhering to an 1800 calorie nutrition plan and exercising two days a week with 60 minutes of cardio. Subbound 7.5 mg weekly, reduced from 10 mg due to nausea, which has improved. Body fat percentage is 27%, with a decrease in fat mass from 77 to 60 pounds. Basal metabolic rate is 7899 calories, creating a 300 calorie deficit with the current intake. Potential plateau as body fat percentage approaches essential fat levels. - Continue Zepbound  7.5 mg weekly. - Maintain 1800 calorie nutrition plan.  May be due for calorie adjustment if he hits a weight loss plateau to adjust for new weight. - Increase physical activity to 240 minutes per week. - Monitor weight and body composition. - Consider increasing Zepbound  to 10 mg if weight loss plateaus and tolerance is built.  He had nausea at this dose in the past - Send prescription refill to [facility] in [place].

## 2024-02-06 NOTE — Progress Notes (Signed)
 Office: [phone]  /  Fax: [phone]  Weight Summary and Body Composition Analysis (BIA)  Vitals Temp: (!) 97.5 F (36.4 C) BP: 119/73 Pulse Rate: 65 SpO2: 96 %   Anthropometric Measurements Height: 5' 9 (1.753 m) Weight: 218 lb (98.9 kg) BMI (Calculated): 32.18 Weight at Last Visit: 221 lb Weight Lost Since Last Visit: 3 lb Weight Gained Since Last Visit: 0 lb Starting Weight: 247 lb Total Weight Loss (lbs): 29 lb (13.2 kg) Peak Weight: 285 lb   Body Composition  Body Fat %: 27.5 % Fat Mass (lbs): 60 lbs Muscle Mass (lbs): 150.6 lbs Total Body Water (lbs): 108.4 lbs Visceral Fat Rating : 16    RMR: [id number]  Today's Visit #: 11  Starting Date: 03/21/23   Subjective   Chief Complaint: Obesity  Interval History Discussed the use of AI scribe software for clinical note transcription with the patient, who gave verbal consent to proceed.  History of Present Illness Steven Sloan is a 63 year old male who presents for medical weight management.  He has lost three pounds since his last visit and is adhering to an 1800 calorie nutrition plan. He exercises two days a week for sixty minutes, focusing on cardio. He is taking semaglutide 7.5 mg once a week, reduced from 10 mg due to nausea. Nausea has significantly improved and only occurs when he needs to have a bowel movement, resolving after about twenty minutes.  He has a history of aortic stenosis and is being managed expectantly. He reports no symptoms such as dizziness or chest pain during exertion. He uses a CPAP machine every night for sleep apnea and reports no issues with its use. His blood pressure is well controlled with lisinopril  10 mg and Hydrea. No lightheadedness or dizziness.  For anxiety, he uses hydroxyzine  a couple of times a month, not on a weekly basis. He does not report significant issues with potential side effects such as constipation and dry mouth.  He is focusing on  maintaining muscle mass through protein intake and strength training. He has small dumbbells at home and is working on increasing his physical activity to meet his goals.     Challenges affecting patient progress: none.    Pharmacotherapy for weight management: He is currently taking Zepbound  with adequate clinical response  and without side effects..   Assessment and Plan   Treatment Plan For Obesity:  Recommended Dietary Goals  Steven Sloan is currently in the action stage of change. As such, his goal is to continue weight management plan. He has agreed to: incorporate 1-2 meal replacements a day for convenience  and continue current plan  Behavioral Health and Counseling  We discussed the following behavioral modification strategies today: continue to work on maintaining a reduced calorie state, getting the recommended amount of protein, incorporating whole foods, making healthy choices, staying well hydrated and practicing mindfulness when eating. and increase protein intake, fibrous foods (25 grams per day for women, 30 grams for men) and water to improve satiety and decrease hunger signals. .  Additional education and resources provided today: None  Recommended Physical Activity Goals  Steven Sloan has been advised to work up to 150 minutes of moderate intensity aerobic activity a week and strengthening exercises 2-3 times per week for cardiovascular health, weight loss maintenance and preservation of muscle mass.  He has agreed to :  Increase volume of physical activity to a goal of 240 minutes a week and Combine aerobic and strengthening exercises for efficiency  and improved cardiometabolic health.  Medical Interventions and Pharmacotherapy  We discussed various medication options to help Steven Sloan with his weight loss efforts and we both agreed to : Adequate clinical response to anti-obesity medication, continue current regimen  Associated Conditions Impacted by Obesity  Treatment  Assessment & Plan Essential hypertension, benign Blood pressure has improved.  Currently on terazosin  and lisinopril .  Tirzepatide  also helps with blood pressure control.  Continue current regimen OSA (obstructive sleep apnea) Severe OSA per history.  On CPAP with reported good compliance. Continue PAP therapy.  Losing 15% of body weight may reduce AHI.  Continue GLP-1 aided weight loss Prediabetes Asymptomatic.  Currently on Zepbound  for pharmacoprophylaxis.  Continue current weight management strategy  Class 2 severe obesity with serious comorbidity and body mass index (BMI) of 36.0 to 36.9 in adult, unspecified obesity type Weight: decrease of 65 lb (23%) over 2 years, 10 months  Start: 03/11/2021 283 lb (128.4 kg)  End: 02/06/2024 218 lb (98.9 kg)  Current weight loss of three pounds since the last visit. Adhering to an 1800 calorie nutrition plan and exercising two days a week with 60 minutes of cardio. Subbound 7.5 mg weekly, reduced from 10 mg due to nausea, which has improved. Body fat percentage is 27%, with a decrease in fat mass from 77 to 60 pounds. Basal metabolic rate is 7899 calories, creating a 300 calorie deficit with the current intake. Potential plateau as body fat percentage approaches essential fat levels. - Continue Zepbound  7.5 mg weekly. - Maintain 1800 calorie nutrition plan.  May be due for calorie adjustment if he hits a weight loss plateau to adjust for new weight. - Increase physical activity to 240 minutes per week. - Monitor weight and body composition. - Consider increasing Zepbound  to 10 mg if weight loss plateaus and tolerance is built.  He had nausea at this dose in the past - Send prescription refill to [facility] in [place].          Objective   Physical Exam:  Blood pressure 119/73, pulse 65, temperature (!) 97.5 F (36.4 C), height 5' 9 (1.753 m), weight 218 lb (98.9 kg), SpO2 96%. Body mass index is 32.19 kg/m.  General: He is  overweight, cooperative, alert, well developed, and in no acute distress. PSYCH: Has normal mood, affect and thought process.   HEENT: EOMI, sclerae are anicteric. Lungs: Normal breathing effort, no conversational dyspnea. Extremities: No edema.  Neurologic: No gross sensory or motor deficits. No tremors or fasciculations noted.    Diagnostic Data Reviewed:  BMET    Component Value Date/Time   NA 138 10/23/2023 1022   K 4.6 10/23/2023 1022   CL 101 10/23/2023 1022   CO2 25 10/23/2023 1022   GLUCOSE 75 10/23/2023 1022   BUN 25 (H) 10/23/2023 1022   CREATININE 1.32 10/23/2023 1022   CALCIUM  10.3 10/23/2023 1022   GFRNONAA >60 04/09/2015 1140   GFRAA >60 04/09/2015 1140   Lab Results  Component Value Date   HGBA1C 5.3 03/21/2023   HGBA1C 5.0 07/22/2015   Lab Results  Component Value Date   INSULIN  12.6 03/21/2023   Lab Results  Component Value Date   TSH 0.78 04/10/2023   CBC    Component Value Date/Time   WBC 6.4 04/10/2023 1034   RBC 5.11 04/10/2023 1034   HGB 15.9 04/10/2023 1034   HCT 46.9 04/10/2023 1034   PLT 224.0 04/10/2023 1034   MCV 91.8 04/10/2023 1034   MCH 31.8 04/09/2015 1140  MCHC 34.0 04/10/2023 1034   RDW 12.9 04/10/2023 1034   Iron Studies No results found for: IRON, TIBC, FERRITIN, IRONPCTSAT Lipid Panel     Component Value Date/Time   CHOL 115 04/10/2023 1034   TRIG 78.0 04/10/2023 1034   HDL 45.40 04/10/2023 1034   CHOLHDL 3 04/10/2023 1034   VLDL 15.6 04/10/2023 1034   LDLCALC 54 04/10/2023 1034   Hepatic Function Panel     Component Value Date/Time   PROT 7.8 10/23/2023 1022   ALBUMIN 4.7 10/23/2023 1022   AST 24 10/23/2023 1022   ALT 28 10/23/2023 1022   ALKPHOS 48 10/23/2023 1022   BILITOT 1.0 10/23/2023 1022   BILIDIR 0.2 12/07/2022 0933      Component Value Date/Time   TSH 0.78 04/10/2023 1034   Nutritional Lab Results  Component Value Date   VD25OH 40.9 03/21/2023    Medications: Outpatient Encounter  Medications as of 02/06/2024  Medication Sig   aspirin EC 81 MG tablet Take 81 mg by mouth daily.   atorvastatin  (LIPITOR) 10 MG tablet TAKE 1 TABLET BY MOUTH EVERY DAY   Azelastine  HCl 137 MCG/SPRAY SOLN INSTILL 1-2 SPRAYS INTO BOTH NOSTRILS 2 TIMES DAILY AS DIRECTED   chlorhexidine (PERIDEX) 0.12 % solution    fluticasone  (FLONASE ) 50 MCG/ACT nasal spray USE 2 SPRAYS IN BOTH NOSTRILS  DAILY   hydrOXYzine  (ATARAX ) 10 MG tablet TAKE 1 TABLET BY MOUTH 3 TIMES DAILY AS NEEDED FOR ANXIETY.   lisinopril  (ZESTRIL ) 10 MG tablet TAKE 1 TABLET BY MOUTH DAILY   Multiple Vitamins-Minerals (MULTIVITAMIN WITH MINERALS) tablet Take 1 tablet by mouth daily.   ondansetron  (ZOFRAN ) 4 MG tablet Take 1 tablet (4 mg total) by mouth every 8 (eight) hours as needed for nausea or vomiting.   psyllium (REGULOID) 0.52 g capsule Take 0.52 g by mouth daily.   sertraline  (ZOLOFT ) 100 MG tablet Take 2 tablets (200 mg total) by mouth daily.   terazosin  (HYTRIN ) 1 MG capsule TAKE 1 CAPSULE BY MOUTH AT  BEDTIME   [DISCONTINUED] tirzepatide  (ZEPBOUND ) 7.5 MG/0.5ML Pen Inject 7.5 mg into the skin once a week.   tirzepatide  (ZEPBOUND ) 7.5 MG/0.5ML Pen Inject 7.5 mg into the skin once a week.   No facility-administered encounter medications on file as of 02/06/2024.     Follow-Up   Return in about 4 weeks (around 03/05/2024) for For Weight Mangement with Dr. Francyne.SABRA He was informed of the importance of frequent follow up visits to maximize his success with intensive lifestyle modifications for his multiple health conditions.  Attestation Statement   Reviewed by clinician on day of visit: allergies, medications, problem list, medical history, surgical history, family history, social history, and previous encounter notes.     Lucas Francyne, MD

## 2024-02-08 ENCOUNTER — Ambulatory Visit (INDEPENDENT_AMBULATORY_CARE_PROVIDER_SITE_OTHER): Admitting: Behavioral Health

## 2024-02-08 ENCOUNTER — Encounter: Payer: Self-pay | Admitting: Behavioral Health

## 2024-02-08 DIAGNOSIS — F411 Generalized anxiety disorder: Secondary | ICD-10-CM | POA: Diagnosis not present

## 2024-02-08 DIAGNOSIS — F4323 Adjustment disorder with mixed anxiety and depressed mood: Secondary | ICD-10-CM

## 2024-02-08 NOTE — Progress Notes (Signed)
 [facility] Counselor/Therapist Progress Note  Patient ID: Steven Sloan, MRN: [id number],    Date: 02/08/2024  Time Spent:  minutes.This session was held via video teletherapy. The patient consented to the video teletherapy and was located in his home office during this session. He is aware it is the responsibility of the patient to secure confidentiality on his end of the session. The provider was in a private home office for the duration of this session.  Medically the patient says he is doing well.  For started having issues with his heart his doctor thought it might be 6 to 9 months and has been almost a year and he feels well.  He has lost all total of close to 70 pounds.  He is walking a couple of miles per day.  He is eating healthier.  There are still some inherent work stress but for the most part he feels he is managing that well.  He is getting a little more by and from his wife and daughter in terms of getting things done around the house would like him to be able to do more.  He is learning to consistent steady approach usually gets more results so we will be persistent about it.  He continues to use relaxation music and coping skills which have been beneficial to him and he feels that for the most part he stays calm. He does contract for safety having no thoughts of hurting himself or anyone else. Treatment Type: Individual Therapy  Reported Symptoms: Anxiety, stress, depression  Mental Status Exam: Appearance:  Well Groomed     Behavior: Appropriate  Motor: Normal  Speech/Language:  Normal Rate  Affect: Appropriate  Mood: normal  Thought process: normal  Thought content:   WNL  Sensory/Perceptual disturbances:   WNL  Orientation: oriented to person, place, time/date, situation, day of week, month of year, and year  Attention: Good  Concentration: Good  Memory: WNL  Fund of knowledge:  Good  Insight:   Good  Judgment:  Good  Impulse Control: Good   Risk  Assessment: Danger to Self:  No Self-injurious Behavior: No Danger to Others: No Duty to Warn:no Physical Aggression / Violence:No  Access to Firearms a concern: No  Gang Involvement:No   Subjective: The patient has temporarily plateaued with his weight loss but said that the adjusting of the dosage as expected.  At the dosage he is at he is not having nearly as much discomfort and nausea etc.  Reports that for the most part physically he feels pretty good and has not had any specific heart related issues which she is thankful for.  He is concerned about choices that his sister is making but also how her choices are impacting his parents as his sister lives with them.  He recognizes that he can only be supportive and that she has to choose to make better decisions.  He is still getting minimal help at home.  Continues to try to find various ways to get his wife and daughter more engaged in helping out around the house especially as he is approaching the busy time with his daughter going back to school to be involved in school and other activities.  He does continue to practice stress reduction techniques as we have practiced some.  He does contract for safety having no thoughts of hurting himself or anyone else.  Interventions: Cognitive Behavioral Therapy and Dialectical Behavioral Therapy Diagnosis:Generalized anxiety disorder  Plan: I will meet with the patient  every 2 to 3 weeks. Treatment plan: Goals are to reduce the patient's anxiety and some mild depression primarily related to medical condition but also other family factors by at least 50% with a target date of July 31, 2023.  Goals for minimizing depression are for the patient to have less sadness as indicated by his report and PHQ-9 scores, have improved mood and return to a healthier level of functioning.  We will look at any other causes for depression in addition to those stated above.  We will explore how depression is  experienced by him, use cognitive behavioral therapy to explore and replace unhealthy thought and behavior patterns contributing to depression as well as encouraging the sharing of feelings related to causes and symptoms of depression.  We will introduce coping skills for managing depressive symptoms.  Goals for reducing anxiety include improving his ability to better manage stress and anxiety symptoms, identify causes for anxiety and introduce ways to lower it including resolving core conflicts contributing to the anxiety.  Finally a goal for the patient will be to manage thoughts and worrisome thinking contributing to feelings of anxiety.  Interventions include providing education about anxiety to help him understand his causes and triggers, facilitate problem solution skills and coping skills for managing anxiety.  We will also use cognitive behavioral therapy to identify and change anxiety provoking thought and behavior patterns as well as dialectical behavior therapy to introduce distress tolerance and mindfulness skills. I reviewed the treatment goals as listed above with patient's agreement and extended the target date to July 30, 2024 Progress: 30%  Lorrene CHRISTELLA Hasten, [facility]                                Lorrene CHRISTELLA Hasten, [facility]               Lorrene CHRISTELLA Hasten, [facility]               Lorrene CHRISTELLA Hasten, [facility]               Lorrene CHRISTELLA Hasten, [facility]               Lorrene CHRISTELLA Hasten, [facility]               Lorrene CHRISTELLA Hasten, [facility]               Lorrene CHRISTELLA Hasten, [facility]               Lorrene CHRISTELLA Hasten, [facility]               Lorrene CHRISTELLA Hasten, [facility]               Lorrene CHRISTELLA Hasten, [facility]

## 2024-02-16 ENCOUNTER — Other Ambulatory Visit

## 2024-02-21 ENCOUNTER — Ambulatory Visit (INDEPENDENT_AMBULATORY_CARE_PROVIDER_SITE_OTHER): Admitting: Behavioral Health

## 2024-02-21 ENCOUNTER — Encounter: Payer: Self-pay | Admitting: Behavioral Health

## 2024-02-21 DIAGNOSIS — F4323 Adjustment disorder with mixed anxiety and depressed mood: Secondary | ICD-10-CM

## 2024-02-21 NOTE — Progress Notes (Unsigned)
 [facility] Counselor/Therapist Progress Note  Patient ID: Robet Crutchfield, MRN: [id number],    Date: 02/21/2024  Time Spent:  minutes.This session was held via video teletherapy. The patient consented to the video teletherapy and was located in his home office during this session. He is aware it is the responsibility of the patient to secure confidentiality on his end of the session. The provider was in a private home office for the duration of this session.  Medically the patient says he is doing well.  For started having issues with his heart his doctor thought it might be 6 to 9 months and has been almost a year and he feels well.  He has lost all total of close to 70 pounds.  He is walking a couple of miles per day.  He is eating healthier.  There are still some inherent work stress but for the most part he feels he is managing that well.  He is getting a little more by and from his wife and daughter in terms of getting things done around the house would like him to be able to do more.  He is learning to consistent steady approach usually gets more results so we will be persistent about it.  Most of today's session was focused on working and work history.  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  Eventually he was let go away within weeks his old boss who worked at a different division of the same company already back on a contract basis which lasted for about a year and a half and became full-time and now that has been double-digit years.  He is get frustrated with the amount of meetings that he has to be a part of that his boss recently left leaving him more than a lot of others to even though he does not feel he  knows enough they are finding ways to make a work.  His plan is to retire there at age 46 and feels that he is maintaining good worklife balance as he can.  He continues to use relaxation music and coping skills which have been beneficial to him and he feels that for the most part he stays calm. He does contract for safety having no thoughts of hurting himself or anyone else. Treatment Type: Individual Therapy  Reported Symptoms: Anxiety, stress, depression  Mental Status Exam: Appearance:  Well Groomed     Behavior: Appropriate  Motor: Normal  Speech/Language:  Normal Rate  Affect: Appropriate  Mood: normal  Thought process: normal  Thought content:   WNL  Sensory/Perceptual disturbances:   WNL  Orientation: oriented to person, place, time/date, situation, day of week, month of year, and year  Attention: Good  Concentration: Good  Memory: WNL  Fund of knowledge:  Good  Insight:   Good  Judgment:  Good  Impulse Control: Good   Risk Assessment: Danger to Self:  No Self-injurious Behavior: No Danger to Others: No Duty to Warn:no Physical Aggression / Violence:No  Access to Firearms a concern: No  Gang Involvement:No   Subjective: The patient has temporarily plateaued with his weight loss but said that the adjusting of the dosage as  expected.  At the dosage he is at he is not having nearly as much discomfort and nausea etc.  Reports that for the most part physically he feels pretty good and has not had any specific heart related issues which she is thankful for.  He is concerned about choices that his sister is making but also how her choices are impacting his parents as his sister lives with them.  He recognizes that he can only be supportive and that she has to choose to make better decisions.  He is still getting minimal help at home.  Continues to try to find various ways to get his wife and daughter more engaged in helping out around the house especially as he is approaching  the busy time with his daughter going back to school to be involved in school and other activities.  He does continue to practice stress reduction techniques as we have practiced some.  He does contract for safety having no thoughts of hurting himself or anyone else.  Interventions: Cognitive Behavioral Therapy and Dialectical Behavioral Therapy Diagnosis:Generalized anxiety disorder  Plan: I will meet with the patient every 2 to 3 weeks. Treatment plan: Goals are to reduce the patient's anxiety and some mild depression primarily related to medical condition but also other family factors by at least 50% with a target date of July 31, 2023.  Goals for minimizing depression are for the patient to have less sadness as indicated by his report and PHQ-9 scores, have improved mood and return to a healthier level of functioning.  We will look at any other causes for depression in addition to those stated above.  We will explore how depression is experienced by him, use cognitive behavioral therapy to explore and replace unhealthy thought and behavior patterns contributing to depression as well as encouraging the sharing of feelings related to causes and symptoms of depression.  We will introduce coping skills for managing depressive symptoms.  Goals for reducing anxiety include improving his ability to better manage stress and anxiety symptoms, identify causes for anxiety and introduce ways to lower it including resolving core conflicts contributing to the anxiety.  Finally a goal for the patient will be to manage thoughts and worrisome thinking contributing to feelings of anxiety.  Interventions include providing education about anxiety to help him understand his causes and triggers, facilitate problem solution skills and coping skills for managing anxiety.  We will also use cognitive behavioral therapy to identify and change anxiety provoking thought and behavior patterns as well as dialectical behavior  therapy to introduce distress tolerance and mindfulness skills. I reviewed the treatment goals as listed above with patient's agreement and extended the target date to July 30, 2024 Progress: 30%  Lorrene CHRISTELLA Hasten, [facility]                                Lorrene CHRISTELLA Hasten, [facility]               Lorrene CHRISTELLA Hasten, [facility]               Lorrene CHRISTELLA Hasten, [facility]               Lorrene CHRISTELLA Hasten, [facility]               Lorrene CHRISTELLA Hasten, [facility]               Lorrene CHRISTELLA Hasten, [facility]  Lorrene CHRISTELLA Hasten, [facility]               Lorrene CHRISTELLA Hasten, [facility]               Lorrene CHRISTELLA Hasten, [facility]               Lorrene CHRISTELLA Hasten, [facility]               Lorrene CHRISTELLA Hasten, [facility]

## 2024-02-22 DIAGNOSIS — R102 Pelvic and perineal pain unspecified side: Secondary | ICD-10-CM | POA: Insufficient documentation

## 2024-02-26 ENCOUNTER — Encounter: Payer: Self-pay | Admitting: Podiatry

## 2024-02-26 ENCOUNTER — Ambulatory Visit (INDEPENDENT_AMBULATORY_CARE_PROVIDER_SITE_OTHER): Admitting: Podiatry

## 2024-02-26 DIAGNOSIS — B351 Tinea unguium: Secondary | ICD-10-CM

## 2024-02-26 MED ORDER — TERBINAFINE HCL 250 MG PO TABS: 250.0000 mg | ORAL_TABLET | Freq: Every day | ORAL | 0 refills | Status: AC

## 2024-02-26 NOTE — Progress Notes (Signed)
  Subjective:  Patient ID: Steven Sloan, male    DOB: 1961-03-07,   MRN: [id number]  Chief Complaint  Patient presents with   Nail Problem    Rm22 Nail fungus f/u patient says that he is doing well and has improvement/ wants to know if he can continue with laser treatment and lamisil .    63 y.o. male presents for follow-up of fungal nails . I has finished Lamisil  and completed 3 laser treatments.  Denies any other pedal complaints. Denies n/v/f/c.   Past Medical History:  Diagnosis Date   Allergy    Seasonal   Anxiety    Aortic valve stenosis 08/10/2022   severe   Chicken pox    Heart murmur    Asymptomatic -- Patient endorses previous negative evaluation   High cholesterol    Hypertension    Sleep apnea    CPAP nightly    Objective:  Physical Exam: Vascular: DP/PT pulses 2/4 bilateral. CFT <3 seconds. Normal hair growth on digits. No edema.  Skin. No lacerations or abrasions bilateral feet. Left hallux nail with distal thickened and subungual debris. Does appear improved some thickened and while scaling noted. Distal 1/2 of nail.  Musculoskeletal: MMT 5/5 bilateral lower extremities in DF, PF, Inversion and Eversion. Deceased ROM in DF of ankle joint.  Neurological: Sensation intact to light touch.   Assessment:   1. Onychomycosis        Plan:  Patient was evaluated and treated and all questions answered. -Examined patient -Discussed treatment options for painful dystrophic nails  -Culture positive for fungus. T rubrum.  -Discussed fungal nail treatment options including oral, topical, and laser treatments.  Will continue laser treatments. Will also do 1 more round of Lamisil  for 3 months.  Lamisil  sent to pharmacy 90 days. -Patient to return after laser treatment if any concern.  Asberry Failing, DPM

## 2024-03-05 ENCOUNTER — Encounter (INDEPENDENT_AMBULATORY_CARE_PROVIDER_SITE_OTHER): Payer: Self-pay | Admitting: Internal Medicine

## 2024-03-05 ENCOUNTER — Ambulatory Visit (INDEPENDENT_AMBULATORY_CARE_PROVIDER_SITE_OTHER): Payer: Self-pay | Admitting: Internal Medicine

## 2024-03-05 VITALS — BP 123/78 | HR 83 | Temp 98.0°F | Ht 69.0 in | Wt 222.0 lb

## 2024-03-05 DIAGNOSIS — R110 Nausea: Secondary | ICD-10-CM | POA: Diagnosis not present

## 2024-03-05 DIAGNOSIS — Z6832 Body mass index (BMI) 32.0-32.9, adult: Secondary | ICD-10-CM

## 2024-03-05 DIAGNOSIS — I10 Essential (primary) hypertension: Secondary | ICD-10-CM

## 2024-03-05 DIAGNOSIS — R7303 Prediabetes: Secondary | ICD-10-CM

## 2024-03-05 DIAGNOSIS — E66811 Obesity, class 1: Secondary | ICD-10-CM

## 2024-03-05 DIAGNOSIS — G4733 Obstructive sleep apnea (adult) (pediatric): Secondary | ICD-10-CM | POA: Diagnosis not present

## 2024-03-05 MED ORDER — TIRZEPATIDE-WEIGHT MANAGEMENT 10 MG/0.5ML ~~LOC~~ SOAJ: 10.0000 mg | SUBCUTANEOUS | 0 refills | Status: DC

## 2024-03-05 MED ORDER — ONDANSETRON HCL 4 MG PO TABS: 4.0000 mg | ORAL_TABLET | Freq: Three times a day (TID) | ORAL | 1 refills | Status: AC | PRN

## 2024-03-05 NOTE — Assessment & Plan Note (Addendum)
 Nausea associated with Zepbound  use, previously leading to dose reduction. Discussed dietary triggers that may exacerbate nausea, including greasy, fatty, spicy, sugary foods, and alcohol. Emphasized importance of dietary modifications to manage side effects. - Provided Zofran  for nausea management with refills. - Advised on dietary modifications to avoid nausea triggers.

## 2024-03-05 NOTE — Progress Notes (Signed)
 Office: [phone]  /  Fax: [phone]  Weight Summary and Body Composition Analysis (BIA)  Vitals Temp: 98 F (36.7 C) BP: 123/78 Pulse Rate: 83 SpO2: 97 %   Anthropometric Measurements Height: 5' 9 (1.753 m) Weight: 222 lb (100.7 kg) BMI (Calculated): 32.77 Weight at Last Visit: 218 lb Weight Lost Since Last Visit: 0 lb Weight Gained Since Last Visit: 1 lb Starting Weight: 247 lb Total Weight Loss (lbs): 25 lb (11.3 kg) Peak Weight: 285 lb   Body Composition  Body Fat %: 28.5 % Fat Mass (lbs): 63.4 lbs Muscle Mass (lbs): 151.2 lbs Total Body Water (lbs): 108.6 lbs Visceral Fat Rating : 16    RMR: 2282  Today's Visit #: 12  Starting Date: 03/21/23   Subjective   Chief Complaint: Obesity  Interval History Discussed the use of AI scribe software for clinical note transcription with the patient, who gave verbal consent to proceed.  History of Present Illness Steven Sloan is a 63 year Sloan male with obesity, prediabetes, sleep apnea, and hypertension who presents for medical weight management.  He has gained four pounds since his last visit, attributing this to a lack of exercise and increased sugar intake during Halloween. He was ill for about a week and a half, possibly with COVID, which interrupted his walking routine. His wife tested positive for COVID, but he tested negative.  He is currently on Zepbound  7.5 mg once a week for obesity and prediabetes, having previously been on 10 mg. The dose was reduced due to significant nausea. He recently tried an extra 10 mg dose and managed the nausea by eating and drinking more, noting that it seemed to work well.  He has severe obstructive sleep apnea and uses CPAP every night. He is also on terazosin  and lisinopril  for hypertension.  He has lost a significant amount of weight over the past two years and eight months, going from around 285 pounds to a low of 214 pounds, but has been on an upward  trend since July, gaining a total of seven pounds.  His current BMI is 32, with a body fat percentage of 28. He aims to reduce his body fat percentage to below 25.     Challenges affecting patient progress: metabolic adaptations associated with weight loss.    Pharmacotherapy for weight management: He is currently taking Zepbound  with adequate clinical response  and without side effects..   Assessment and Plan   Treatment Plan For Obesity:  Recommended Dietary Goals  Steven Sloan is currently in the action stage of change. As such, his goal is to continue weight management plan. He has agreed to: follow the Category 3 plan - 1500 kcal per day  Behavioral Health and Counseling  We discussed the following behavioral modification strategies today: work on tracking and journaling calories using tracking application, continue to work on maintaining a reduced calorie state, getting the recommended amount of protein, incorporating whole foods, making healthy choices, staying well hydrated and practicing mindfulness when eating., and increase protein intake, fibrous foods (25 grams per day for women, 30 grams for men) and water to improve satiety and decrease hunger signals. .  Additional education and resources provided today: Handout on traveling and holiday eating strategies and Mainteneance  Recommended Physical Activity Goals  [name] has been advised to work up to 150 minutes of moderate intensity aerobic activity a week and strengthening exercises 2-3 times per week for cardiovascular health, weight loss maintenance and preservation of muscle mass.  He has agreed to :  Increase volume of physical activity to a goal of 240 minutes a week and Combine aerobic and strengthening exercises for efficiency and improved cardiometabolic health.  Medical Interventions and Pharmacotherapy  We discussed various medication options to help Brasen with his weight loss efforts and we both agreed to :  Nutritional guidance was provided to help minimize the risk of exacerbating GLP-1 related side effects and Patient was counseled on the importance of maintaining healthy lifestyle habits, including balanced nutrition, regular physical activity, and behavioral modifications, while taking antiobesity medication.  Patient verbalized understanding that medication is an adjunct to, not a replacement for, lifestyle changes and that the long-term success and weight maintenance depend on continued adherence to these strategies.  Associated Conditions Impacted by Obesity Treatment  Assessment & Plan Nausea Nausea associated with Zepbound  use, previously leading to dose reduction. Discussed dietary triggers that may exacerbate nausea, including greasy, fatty, spicy, sugary foods, and alcohol. Emphasized importance of dietary modifications to manage side effects. - Provided Zofran  for nausea management with refills. - Advised on dietary modifications to avoid nausea triggers. Prediabetes Asymptomatic.  Currently on Zepbound  for pharmacoprophylaxis.  Continue current weight management strategy  OSA (obstructive sleep apnea) Severe OSA per history.  On CPAP with reported good compliance. Continue PAP therapy.  Losing 15% of body weight may reduce AHI.  Continue GLP-1 aided weight loss Essential hypertension, benign Blood pressure has improved.  Currently on terazosin  and lisinopril .  Tirzepatide  also helps with blood pressure control.  Continue current regimen Class 1 obesity with serious comorbidity and body mass index (BMI) of 32.0 to 32.9 in adult, unspecified obesity type Obesity class 1 with a BMI of 32 and a body fat percentage of 28%. Recent weight gain of 4 pounds attributed to decreased exercise and dietary indiscretions. Previous weight loss of 68 pounds over two years and eight months. Current weight loss medication is Zepbound  7.5 mg weekly. Emphasized importance of consistent exercise, protein  intake, and calorie tracking for weight maintenance.  - Increased Zepbound  to 10 mg weekly. - Provided Zofran  for nausea management with refills. - Set calorie target at 1500 calories per day with a focus on high protein intake. - Provided handout on maintenance phase strategies. - Scheduled follow-up in six weeks to assess response to medication adjustment.       Objective   Physical Exam:  Blood pressure 123/78, pulse 83, temperature 98 F (36.7 C), height 5' 9 (1.753 m), weight 222 lb (100.7 kg), SpO2 97%. Body mass index is 32.78 kg/m.  General: He is overweight, cooperative, alert, well developed, and in no acute distress. PSYCH: Has normal mood, affect and thought process.   HEENT: EOMI, sclerae are anicteric. Lungs: Normal breathing effort, no conversational dyspnea. Extremities: No edema.  Neurologic: No gross sensory or motor deficits. No tremors or fasciculations noted.    Diagnostic Data Reviewed:  BMET    Component Value Date/Time   NA 138 10/23/2023 1022   K 4.6 10/23/2023 1022   CL 101 10/23/2023 1022   CO2 25 10/23/2023 1022   GLUCOSE 75 10/23/2023 1022   BUN 25 (H) 10/23/2023 1022   CREATININE 1.32 10/23/2023 1022   CALCIUM  10.3 10/23/2023 1022   GFRNONAA >60 04/09/2015 1140   GFRAA >60 04/09/2015 1140   Lab Results  Component Value Date   HGBA1C 5.3 03/21/2023   HGBA1C 5.0 07/22/2015   Lab Results  Component Value Date   INSULIN  12.6 03/21/2023   Lab Results  Component Value Date   TSH 0.78 04/10/2023   CBC    Component Value Date/Time   WBC 6.4 04/10/2023 1034   RBC 5.11 04/10/2023 1034   HGB 15.9 04/10/2023 1034   HCT 46.9 04/10/2023 1034   PLT 224.0 04/10/2023 1034   MCV 91.8 04/10/2023 1034   MCH 31.8 04/09/2015 1140   MCHC 34.0 04/10/2023 1034   RDW 12.9 04/10/2023 1034   Iron Studies No results found for: IRON, TIBC, FERRITIN, IRONPCTSAT Lipid Panel     Component Value Date/Time   CHOL 115 04/10/2023 1034    TRIG 78.0 04/10/2023 1034   HDL 45.40 04/10/2023 1034   CHOLHDL 3 04/10/2023 1034   VLDL 15.6 04/10/2023 1034   LDLCALC 54 04/10/2023 1034   Hepatic Function Panel     Component Value Date/Time   PROT 7.8 10/23/2023 1022   ALBUMIN 4.7 10/23/2023 1022   AST 24 10/23/2023 1022   ALT 28 10/23/2023 1022   ALKPHOS 48 10/23/2023 1022   BILITOT 1.0 10/23/2023 1022   BILIDIR 0.2 12/07/2022 0933      Component Value Date/Time   TSH 0.78 04/10/2023 1034   Nutritional Lab Results  Component Value Date   VD25OH 40.9 03/21/2023    Medications: Outpatient Encounter Medications as of 03/05/2024  Medication Sig   tirzepatide  (ZEPBOUND ) 10 MG/0.5ML Pen Inject 10 mg into the skin once a week.   aspirin EC 81 MG tablet Take 81 mg by mouth daily.   atorvastatin  (LIPITOR) 10 MG tablet TAKE 1 TABLET BY MOUTH EVERY DAY   Azelastine  HCl 137 MCG/SPRAY SOLN INSTILL 1-2 SPRAYS INTO BOTH NOSTRILS 2 TIMES DAILY AS DIRECTED   chlorhexidine (PERIDEX) 0.12 % solution    fluticasone  (FLONASE ) 50 MCG/ACT nasal spray USE 2 SPRAYS IN BOTH NOSTRILS  DAILY   hydrOXYzine  (ATARAX ) 10 MG tablet TAKE 1 TABLET BY MOUTH 3 TIMES DAILY AS NEEDED FOR ANXIETY.   lisinopril  (ZESTRIL ) 10 MG tablet TAKE 1 TABLET BY MOUTH DAILY   Multiple Vitamins-Minerals (MULTIVITAMIN WITH MINERALS) tablet Take 1 tablet by mouth daily.   ondansetron  (ZOFRAN ) 4 MG tablet Take 1 tablet (4 mg total) by mouth every 8 (eight) hours as needed for nausea or vomiting.   psyllium (REGULOID) 0.52 g capsule Take 0.52 g by mouth daily.   sertraline  (ZOLOFT ) 100 MG tablet Take 2 tablets (200 mg total) by mouth daily.   terazosin  (HYTRIN ) 1 MG capsule TAKE 1 CAPSULE BY MOUTH AT  BEDTIME   terbinafine  (LAMISIL ) 250 MG tablet Take 1 tablet (250 mg total) by mouth daily.   [DISCONTINUED] ondansetron  (ZOFRAN ) 4 MG tablet Take 1 tablet (4 mg total) by mouth every 8 (eight) hours as needed for nausea or vomiting.   [DISCONTINUED] tirzepatide  (ZEPBOUND ) 7.5  MG/0.5ML Pen Inject 7.5 mg into the skin once a week.   No facility-administered encounter medications on file as of 03/05/2024.     Follow-Up   No follow-ups on file.SABRA He was informed of the importance of frequent follow up visits to maximize his success with intensive lifestyle modifications for his multiple health conditions.  Attestation Statement   Reviewed by clinician on day of visit: allergies, medications, problem list, medical history, surgical history, family history, social history, and previous encounter notes.     Lucas Parker, MD

## 2024-03-05 NOTE — Assessment & Plan Note (Signed)
 Asymptomatic.  Currently on Zepbound  for pharmacoprophylaxis.  Continue current weight management strategy

## 2024-03-05 NOTE — Assessment & Plan Note (Signed)
 Blood pressure has improved.  Currently on terazosin  and lisinopril .  Tirzepatide  also helps with blood pressure control.  Continue current regimen

## 2024-03-05 NOTE — Assessment & Plan Note (Signed)
 Severe OSA per history.  On CPAP with reported good compliance. Continue PAP therapy.  Losing 15% of body weight may reduce AHI.  Continue GLP-1 aided weight loss

## 2024-03-07 ENCOUNTER — Ambulatory Visit: Admitting: Behavioral Health

## 2024-03-07 ENCOUNTER — Encounter: Payer: Self-pay | Admitting: Behavioral Health

## 2024-03-07 DIAGNOSIS — F411 Generalized anxiety disorder: Secondary | ICD-10-CM | POA: Diagnosis not present

## 2024-03-07 DIAGNOSIS — F4323 Adjustment disorder with mixed anxiety and depressed mood: Secondary | ICD-10-CM

## 2024-03-07 NOTE — Progress Notes (Addendum)
 "  [facility] Counselor/Therapist Progress Note  Patient ID: Steven Sloan, MRN: [id number],    Date: 03/07/2024  Time Spent: 2:02 PM until 3 PM, 58 minutes.  This session was conducted via a care agility telehealth video visit.  Patient consented to the visit.  The patient was located in his home and this therapist was in his home outpatient therapy office. The patient is under significant stress.  His wife started having some pain late last week and that culminated on Tuesday of this week which was his birthday having to take his wife to the hospital for surgery for having her gallbladder removed.  Home today but cannot do much for a while.  His daughter is in the midst of school as well as swimming competitions so he is having to take her places help her with schoolwork as well as maintaining the house such as cooking cleaning etc.  He has not had time to walk which has been stress-related for him or workout and is sharp which also brings significant stress relief.  He is doing as much as he can in terms of coping skills and music anxiety reduction techniques as he can.  He is still on Zepp bound and losing weight but he and his doctor feel he has plateaued so he will be going up in dose somewhat and he is hopeful that his body has adjusted to a better and he does not fight some of the gastrointestinal issues that came with this dose previously.  He did say that he is eating as healthy as he can and he is sleeping well so those are 2 things that he is doing well for himself. He continues to use relaxation music and coping skills which have been beneficial to him and he feels that for the most part he stays calm. He does contract for safety having no thoughts of hurting himself or anyone else. Treatment Type: Individual Therapy  Reported Symptoms: Anxiety, stress, depression  Mental Status Exam: Appearance:  Well Groomed     Behavior: Appropriate  Motor: Normal  Speech/Language:   Normal Rate  Affect: Appropriate  Mood: normal  Thought process: normal  Thought content:   WNL  Sensory/Perceptual disturbances:   WNL  Orientation: oriented to person, place, time/date, situation, day of week, month of year, and year  Attention: Good  Concentration: Good  Memory: WNL  Fund of knowledge:  Good  Insight:   Good  Judgment:  Good  Impulse Control: Good   Risk Assessment: Danger to Self:  No Self-injurious Behavior: No Danger to Others: No Duty to Warn:no Physical Aggression / Violence:No  Access to Firearms a concern: No  Gang Involvement:No   Subjective: The patient has temporarily plateaued with his weight loss but said that the adjusting of the dosage as expected.  At the dosage he is at he is not having nearly as much discomfort and nausea etc.  Reports that for the most part physically he feels pretty good and has not had any specific heart related issues which she is thankful for.  He is concerned about choices that his sister is making but also how her choices are impacting his parents as his sister lives with them.  He recognizes that he can only be supportive and that she has to choose to make better decisions.  He is still getting minimal help at home.  Continues to try to find various ways to get his wife and daughter more engaged in helping out  around the house especially as he is approaching the busy time with his daughter going back to school to be involved in school and other activities.  He does continue to practice stress reduction techniques as we have practiced some.  He does contract for safety having no thoughts of hurting himself or anyone else.  Interventions: Cognitive Behavioral Therapy and Dialectical Behavioral Therapy Diagnosis:Generalized anxiety disorder  Plan: I will meet with the patient every 2 to 3 weeks. Treatment plan: Goals are to reduce the patient's anxiety and some mild depression primarily related to medical condition but  also other family factors by at least 50% with a target date of July 31, 2023.  Goals for minimizing depression are for the patient to have less sadness as indicated by his report and PHQ-9 scores, have improved mood and return to a healthier level of functioning.  We will look at any other causes for depression in addition to those stated above.  We will explore how depression is experienced by him, use cognitive behavioral therapy to explore and replace unhealthy thought and behavior patterns contributing to depression as well as encouraging the sharing of feelings related to causes and symptoms of depression.  We will introduce coping skills for managing depressive symptoms.  Goals for reducing anxiety include improving his ability to better manage stress and anxiety symptoms, identify causes for anxiety and introduce ways to lower it including resolving core conflicts contributing to the anxiety.  Finally a goal for the patient will be to manage thoughts and worrisome thinking contributing to feelings of anxiety.  Interventions include providing education about anxiety to help him understand his causes and triggers, facilitate problem solution skills and coping skills for managing anxiety.  We will also use cognitive behavioral therapy to identify and change anxiety provoking thought and behavior patterns as well as dialectical behavior therapy to introduce distress tolerance and mindfulness skills. I reviewed the treatment goals as listed above with patient's agreement and extended the target date to July 30, 2024 Progress: 30%  Lorrene CHRISTELLA Hasten, [facility]                                Lorrene CHRISTELLA Hasten, [facility]               Lorrene CHRISTELLA Hasten, [facility]               Lorrene CHRISTELLA Hasten, [facility]               Lorrene CHRISTELLA Hasten, [facility]               Lorrene CHRISTELLA Hasten, [facility]               Lorrene CHRISTELLA Hasten,  [facility]               Lorrene CHRISTELLA Hasten, [facility]               Lorrene CHRISTELLA Hasten, [facility]               Lorrene CHRISTELLA Hasten, [facility]               Lorrene CHRISTELLA Hasten, [facility]               Lorrene CHRISTELLA Hasten, [facility]               Lorrene CHRISTELLA Hasten, [facility] "

## 2024-03-21 ENCOUNTER — Ambulatory Visit: Admitting: Behavioral Health

## 2024-03-25 IMAGING — CT CT CHEST LUNG CANCER SCREENING LOW DOSE W/O CM
2 of 3 series · 15 of 36 positions shown, 18 images · non-contrast
Comparison: 04/09/2015.

CLINICAL DATA: Former smoker, quit [id number], 63 pack-year history.



[Series 2: ldct soft tissue · axial · 0.87mm/px · z∈[+1140,+1390]mm · 12 of 60 slices shown, 15 images]
[im 5/60  mediastinal]
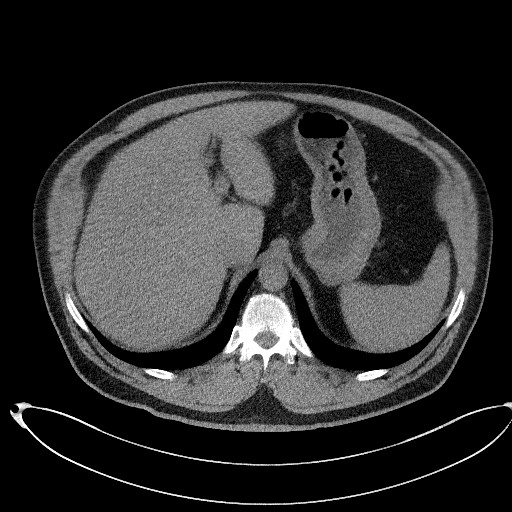
[im 5/60  lung]
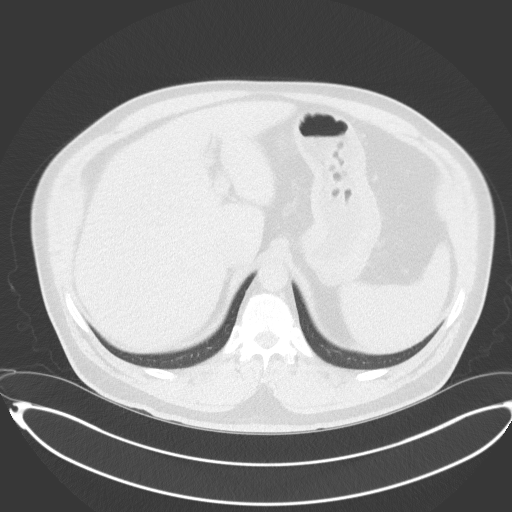
[im 9/60  lung]
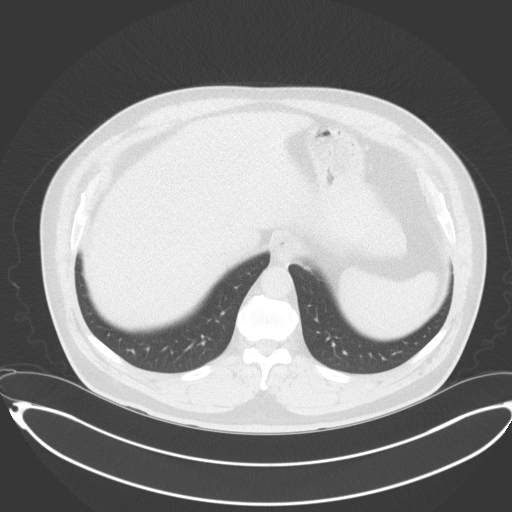
[im 14/60  lung]
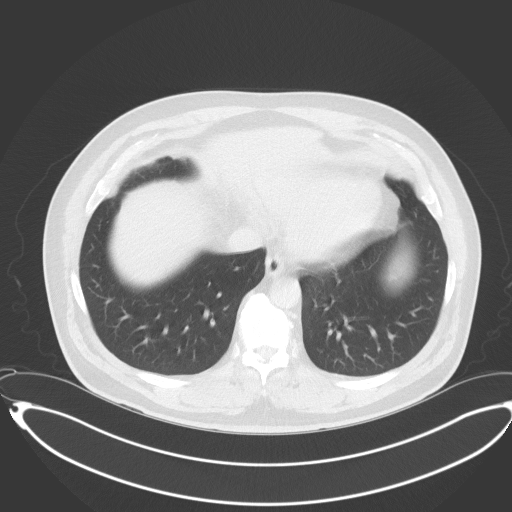
[im 18/60  lung]
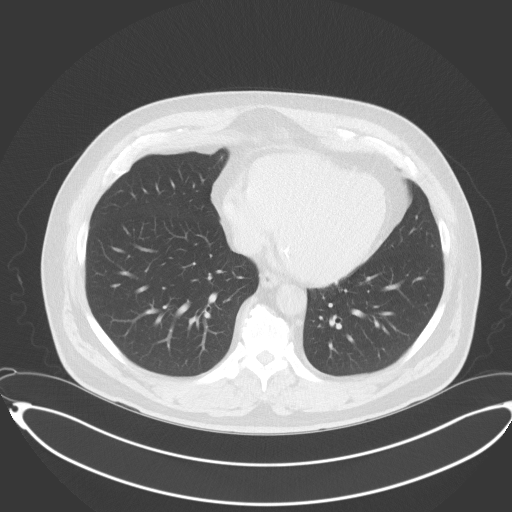
[im 22/60  mediastinal]
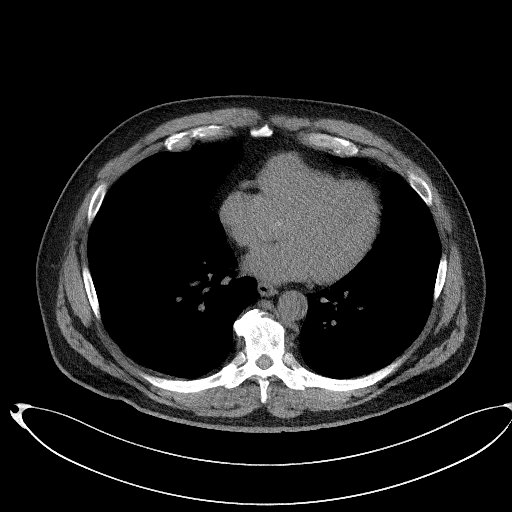
[im 22/60  lung]
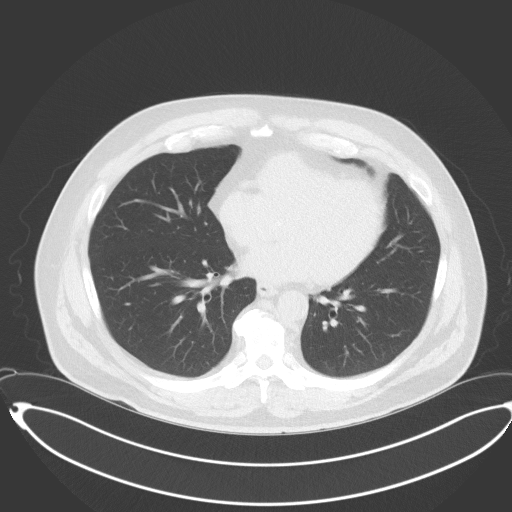
[im 27/60  lung]
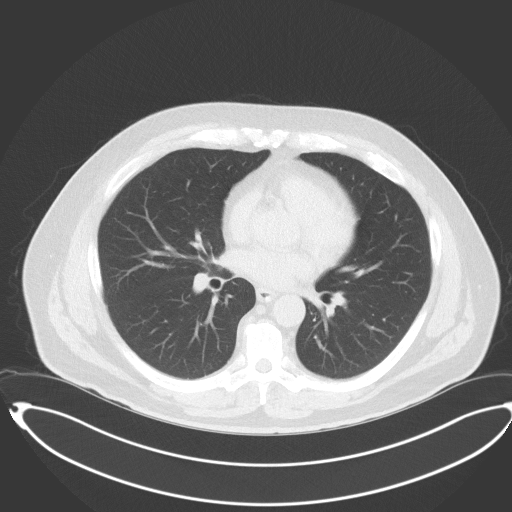
[im 33/60  lung]
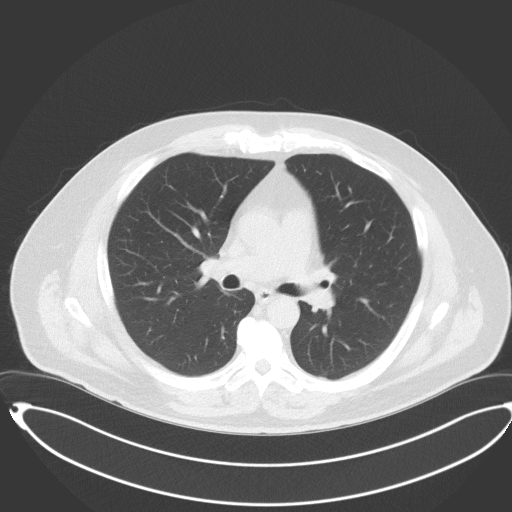
[im 38/60  lung]
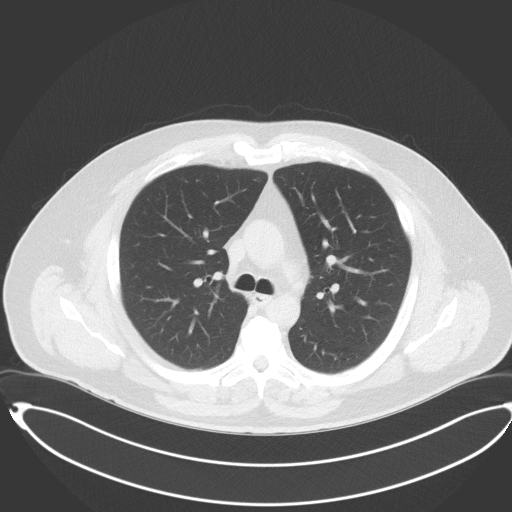
[im 42/60  mediastinal]
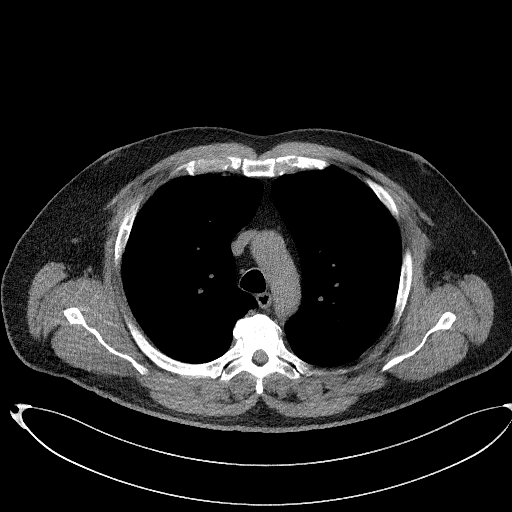
[im 42/60  lung]
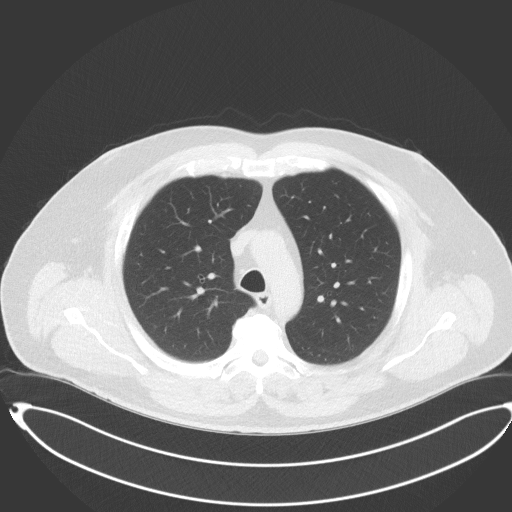
[im 46/60  lung]
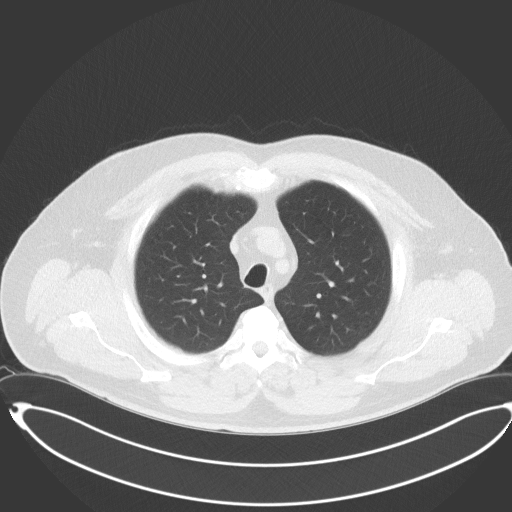
[im 51/60  lung]
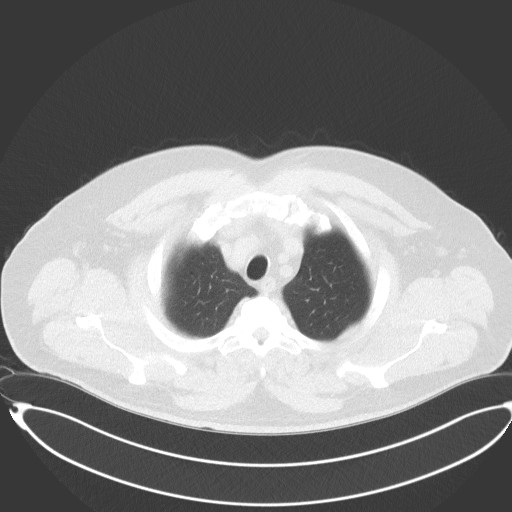
[im 55/60  lung]
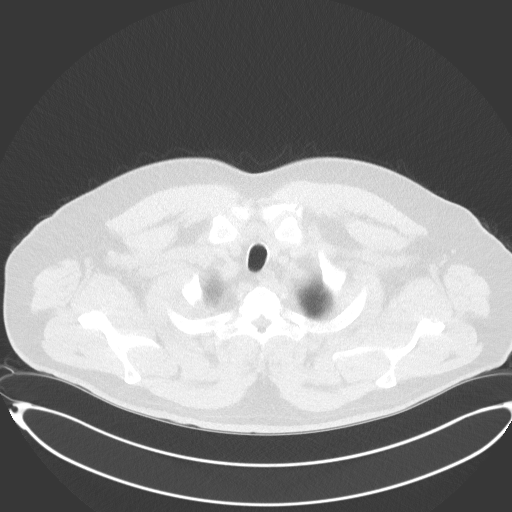

[Series 4: coronal soft · coronal · 0.59mm/px · 3 of 311 slices shown]
[im 63/311  lung]
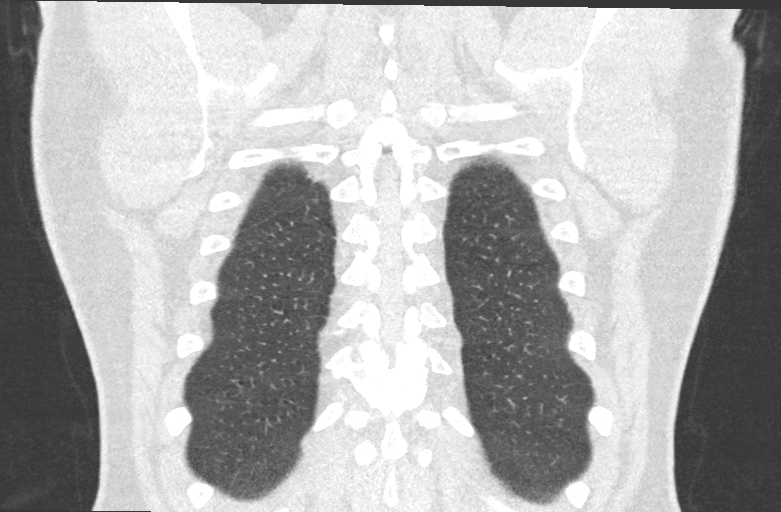
[im 125/311  lung]
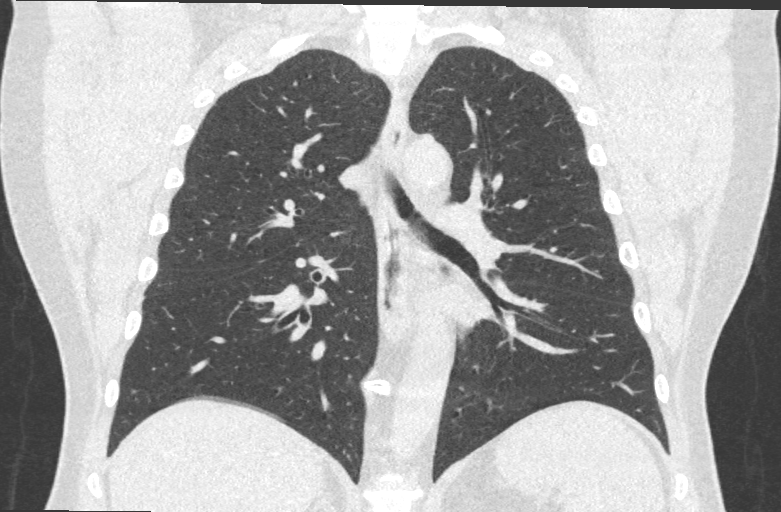
[im 187/311  lung]
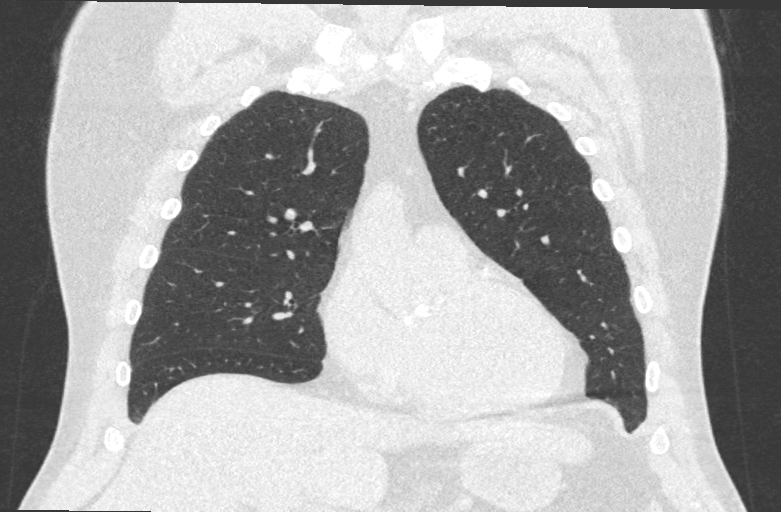

[15 of 36 positions shown; findings below may reference images not displayed]

FINDINGS: Cardiovascular: Atherosclerotic calcification of the aortic valve
and coronary arteries. Heart size normal. No pericardial effusion.

Mediastinum/Nodes: No pathologically enlarged mediastinal or
axillary lymph nodes. Hilar regions are difficult to definitively
evaluate without IV contrast. Esophagus is grossly unremarkable.

Lungs/Pleura: Centrilobular and paraseptal emphysema. No suspicious
pulmonary nodules. No pleural fluid. Airway is unremarkable.

Upper Abdomen: Visualized portions of the liver, gallbladder, right
adrenal gland, spleen, stomach and bowel are grossly unremarkable.

Musculoskeletal: Degenerative changes in the spine. No worrisome
lytic or sclerotic lesions.
IMPRESSION: 1. Lung-RADS 1, negative. Continue annual screening with low-dose
chest CT without contrast in 12 months.
2. Aortic atherosclerosis (L1JL8-QPA.A). Coronary artery
calcification.
3.  Emphysema ([id number]-[id number].A).

## 2024-04-04 ENCOUNTER — Ambulatory Visit: Admitting: Behavioral Health

## 2024-04-04 ENCOUNTER — Encounter: Payer: Self-pay | Admitting: Behavioral Health

## 2024-04-04 ENCOUNTER — Ambulatory Visit (INDEPENDENT_AMBULATORY_CARE_PROVIDER_SITE_OTHER): Payer: Self-pay | Admitting: Internal Medicine

## 2024-04-04 DIAGNOSIS — F411 Generalized anxiety disorder: Secondary | ICD-10-CM

## 2024-04-04 DIAGNOSIS — F4323 Adjustment disorder with mixed anxiety and depressed mood: Secondary | ICD-10-CM

## 2024-04-04 NOTE — Progress Notes (Signed)
  Behavioral Health Counselor/Therapist Progress Note  Patient ID: Steven Sloan, MRN: [id number],    Date: 04/04/2024  Time Spent: 10:03 AM until 10:58 AM, 55 minutes spent via telehealth care agility video visit.  The patient consented to the visit and is aware of his limitations.  The patient was located in his home and this therapist was located in his outpatient home therapy office. Work is still fairly stressful for the patient but he feels that he is managing it fairly well.  His daughter is in a program at school which would lead to a vocational direction of nursing.  He says she is doing very well academically but her teacher as well as the principal caught a meeting with he and his wife because the patient is just not capable of doing the tactical hands on procedure such as taking Pulse, blood pressure etc.  He says that she is not able to multitask because of her autism diagnosis.  That part of the class will start taking place in actual medical settings in March and they wanted to give the patient and his wife a heads up so they could start looking at ways to redirect her.  They felt and the patient thinks that because of the way she prepares and remembers things that she would do well and research insulator looking at ways to direct her into that field.  She will graduate in May 2026.  The patient says physically he is doing well.  He still is having no significant heart changes.  He has lost about 65 to 70 pounds with a goal next being another 35 pounds or so to get to 200 pounds.  He is doing well with the weight loss medication but his doctor has now restricted him to 1500 cal/day which he says is difficult.  He continues to walk even in colder weather.  He feels that he is balancing as well as he can all that he has to do and using his coping skills throughout the day. He does contract for safety having no thoughts of hurting himself or anyone else. Treatment Type: Individual  Therapy  Reported Symptoms: Anxiety, stress, depression  Mental Status Exam: Appearance:  Well Groomed     Behavior: Appropriate  Motor: Normal  Speech/Language:  Normal Rate  Affect: Appropriate  Mood: normal  Thought process: normal  Thought content:   WNL  Sensory/Perceptual disturbances:   WNL  Orientation: oriented to person, place, time/date, situation, day of week, month of year, and year  Attention: Good  Concentration: Good  Memory: WNL  Fund of knowledge:  Good  Insight:   Good  Judgment:  Good  Impulse Control: Good   Risk Assessment: Danger to Self:  No Self-injurious Behavior: No Danger to Others: No Duty to Warn:no Physical Aggression / Violence:No  Access to Firearms a concern: No  Gang Involvement:No   Subjective: The patient has temporarily plateaued with his weight loss but said that the adjusting of the dosage as expected.  At the dosage he is at he is not having nearly as much discomfort and nausea etc.  Reports that for the most part physically he feels pretty good and has not had any specific heart related issues which she is thankful for.  He is concerned about choices that his sister is making but also how her choices are impacting his parents as his sister lives with them.  He recognizes that he can only be supportive and that she has to choose to  make better decisions.  He is still getting minimal help at home.  Continues to try to find various ways to get his wife and daughter more engaged in helping out around the house especially as he is approaching the busy time with his daughter going back to school to be involved in school and other activities.  He does continue to practice stress reduction techniques as we have practiced some.  He does contract for safety having no thoughts of hurting himself or anyone else.  Interventions: Cognitive Behavioral Therapy and Dialectical Behavioral Therapy Diagnosis:Generalized anxiety disorder  Plan: I will  meet with the patient every 2 to 3 weeks. Treatment plan: Goals are to reduce the patient's anxiety and some mild depression primarily related to medical condition but also other family factors by at least 50% with a target date of July 31, 2023.  Goals for minimizing depression are for the patient to have less sadness as indicated by his report and PHQ-9 scores, have improved mood and return to a healthier level of functioning.  We will look at any other causes for depression in addition to those stated above.  We will explore how depression is experienced by him, use cognitive behavioral therapy to explore and replace unhealthy thought and behavior patterns contributing to depression as well as encouraging the sharing of feelings related to causes and symptoms of depression.  We will introduce coping skills for managing depressive symptoms.  Goals for reducing anxiety include improving his ability to better manage stress and anxiety symptoms, identify causes for anxiety and introduce ways to lower it including resolving core conflicts contributing to the anxiety.  Finally a goal for the patient will be to manage thoughts and worrisome thinking contributing to feelings of anxiety.  Interventions include providing education about anxiety to help him understand his causes and triggers, facilitate problem solution skills and coping skills for managing anxiety.  We will also use cognitive behavioral therapy to identify and change anxiety provoking thought and behavior patterns as well as dialectical behavior therapy to introduce distress tolerance and mindfulness skills. I reviewed the treatment goals as listed above with patient's agreement and extended the target date to July 30, 2024 Progress: 30%  Lorrene CHRISTELLA Hasten, [facility]                                Lorrene CHRISTELLA Hasten, [facility]               Lorrene CHRISTELLA Hasten, [facility]               Lorrene CHRISTELLA Hasten,  [facility]               Lorrene CHRISTELLA Hasten, [facility]               Lorrene CHRISTELLA Hasten, [facility]               Lorrene CHRISTELLA Hasten, [facility]               Lorrene CHRISTELLA Hasten, [facility]               Lorrene CHRISTELLA Hasten, [facility]               Lorrene CHRISTELLA Hasten, [facility]               Lorrene CHRISTELLA Hasten, [facility]               Lorrene CHRISTELLA Hasten, [facility]  Lorrene CHRISTELLA Hasten, [facility]               Lorrene CHRISTELLA Hasten, [facility]

## 2024-04-12 ENCOUNTER — Other Ambulatory Visit

## 2024-04-17 ENCOUNTER — Encounter: Payer: Self-pay | Admitting: Behavioral Health

## 2024-04-18 ENCOUNTER — Ambulatory Visit: Admitting: Behavioral Health

## 2024-04-18 ENCOUNTER — Ambulatory Visit (INDEPENDENT_AMBULATORY_CARE_PROVIDER_SITE_OTHER): Admitting: Internal Medicine

## 2024-04-18 ENCOUNTER — Encounter (INDEPENDENT_AMBULATORY_CARE_PROVIDER_SITE_OTHER): Payer: Self-pay | Admitting: Internal Medicine

## 2024-04-18 VITALS — BP 131/77 | HR 60 | Temp 97.1°F | Ht 69.0 in | Wt 220.0 lb

## 2024-04-18 DIAGNOSIS — E66811 Obesity, class 1: Secondary | ICD-10-CM

## 2024-04-18 DIAGNOSIS — G4733 Obstructive sleep apnea (adult) (pediatric): Secondary | ICD-10-CM

## 2024-04-18 DIAGNOSIS — R7303 Prediabetes: Secondary | ICD-10-CM

## 2024-04-18 DIAGNOSIS — I10 Essential (primary) hypertension: Secondary | ICD-10-CM | POA: Diagnosis not present

## 2024-04-18 DIAGNOSIS — Z6832 Body mass index (BMI) 32.0-32.9, adult: Secondary | ICD-10-CM

## 2024-04-18 MED ORDER — ZEPBOUND 12.5 MG/0.5ML ~~LOC~~ SOAJ: 12.5000 mg | SUBCUTANEOUS | 0 refills | Status: DC

## 2024-04-18 NOTE — Progress Notes (Signed)
 Office: [phone]  /  Fax: [phone]  Weight Summary and Body Composition Analysis (BIA)  Vitals Temp: (!) 97.1 F (36.2 C) BP: 131/77 Pulse Rate: 60 SpO2: 98 %   Anthropometric Measurements Height: 5' 9 (1.753 m) Weight: 220 lb (99.8 kg) BMI (Calculated): 32.47 Weight at Last Visit: 222 lb Weight Lost Since Last Visit: 2 lb Weight Gained Since Last Visit: 0 lb Starting Weight: 247 lb Total Weight Loss (lbs): 27 lb (12.2 kg) Peak Weight: 285 lb   Body Composition  Body Fat %: 28.4 % Fat Mass (lbs): 62.6 lbs Muscle Mass (lbs): 150 lbs Total Body Water (lbs): 107.8 lbs Visceral Fat Rating : 16    RMR: [id number]  Today's Visit #: 13  Starting Date: 03/21/23   Subjective   Chief Complaint: Obesity  Interval History  Discussed the use of AI scribe software for clinical note transcription with the patient, who gave verbal consent to proceed.  History of Present Illness Steven Sloan is a 63 year old male who presents for medical weight management.  Weight management and dietary habits - Weight decreased by 2 pounds since last visit - Total weight loss of 63 pounds over three years (previous weight 283 pounds in 2022) - Adheres to a reduced calorie intake of approximately 1500 calories per day - Late-night cravings managed with protein shakes and fruit, with tendency to overeat fruit, especially oranges - Focuses on high-fiber and high-protein foods to increase satiety - Enjoys chickpeas for fiber and protein content - Reduction in clothing size  Appetite and satiety - Sensation of hunger persists even after meals, attributed to fast gastric emptying - Emphasizes high-fiber and high-protein foods to improve satiety  Medication tolerance and side effects - On a medication regimen at a dose of 10 units, completed four doses - No current nausea, previously experienced with medication - Improvement in nausea attributed to better hydration and  management of constipation - Constipation managed with increased fiber intake (fruits, vegetables, Metamucil) - No fatigue with current medication regimen  Sleep quality - Uses CPAP machine with significant improvement in sleep quality - No issues with stress affecting sleep  Physical activity limitations - Family issues involving daughter, wife, father-in-law, brother, and sister-in-law have limited ability to exercise regularly - Plans to take two weeks off to focus on exercise     Challenges affecting patient progress: strong hunger signals and/or impaired satiety / inhibitory control.    Pharmacotherapy for weight management: He is currently taking Zepbound  with adequate clinical response  and without side effects..   Assessment and Plan   Treatment Plan For Obesity:  Recommended Dietary Goals  Steven Sloan is currently in the action stage of change. As such, his goal is to continue weight management plan. He has agreed to: continue current plan  Behavioral Health and Counseling  We discussed the following behavioral modification strategies today: increasing lean protein intake to established goals, increasing vegetables, increasing fiber rich foods, and increasing water intake .  Additional education and resources provided today: None  Recommended Physical Activity Goals  Steven Sloan has been advised to work up to 150 minutes of moderate intensity aerobic activity a week and strengthening exercises 2-3 times per week for cardiovascular health, weight loss maintenance and preservation of muscle mass.  He has agreed to :  Increase volume of physical activity to a goal of 240 minutes a week and Combine aerobic and strengthening exercises for efficiency and improved cardiometabolic health.  Medical Interventions and Pharmacotherapy  We  discussed various medication options to help Steven Sloan with his weight loss efforts and we both agreed to : Increase Zepbound  to 12.5 mg once a  week  Associated Conditions Impacted by Obesity Treatment  Assessment & Plan Class 1 obesity with serious comorbidity and body mass index (BMI) of 32.0 to 32.9 in adult, unspecified obesity type  BMI of 32 and body fat percentage of 28%. He has lost 63 pounds over three years, with a recent loss of 2 pounds since the last visit. He is on a GLP-1 receptor agonist, currently at 10 mg, with no nausea or fatigue. He experiences hunger after meals, indicating a possible gastrointestinal phenotype with faster gastric emptying. He is incorporating high fiber and protein foods into his diet and is tolerating the medication well. He is considering increasing the medication dose to 12.5 mg to improve satiety. - Increased GLP-1 receptor agonist to 12.5 mg. - Continue high fiber and protein diet. - Continue 1500-calorie low-carb diet - Monitor for side effects and adjust medication dose as needed. - Scheduled follow-up in 4 weeks to address insurance change and medication adjustment. OSA (obstructive sleep apnea) Severe OSA per history.  On CPAP with reported good compliance. Continue PAP therapy.  Losing 15% of body weight may reduce AHI.  Continue GLP-1 aided weight loss Essential hypertension, benign Blood pressure has improved.  Currently on terazosin  and lisinopril .  Tirzepatide  also helps with blood pressure control.  Monitor for orthostasis as medication has been adjusted Prediabetes Last A1c on file was 5.3.  Continue with nutrition and behavioral strategies.  Continue Zepbound  for pharmacoprevention         Objective   Physical Exam:  Blood pressure 131/77, pulse 60, temperature (!) 97.1 F (36.2 C), height 5' 9 (1.753 m), weight 220 lb (99.8 kg), SpO2 98%. Body mass index is 32.49 kg/m.  General: He is overweight, cooperative, alert, well developed, and in no acute distress. PSYCH: Has normal mood, affect and thought process.   HEENT: EOMI, sclerae are anicteric. Lungs: Normal  breathing effort, no conversational dyspnea. Extremities: No edema.  Neurologic: No gross sensory or motor deficits. No tremors or fasciculations noted.    Diagnostic Data Reviewed:  BMET    Component Value Date/Time   NA 138 10/23/2023 1022   K 4.6 10/23/2023 1022   CL 101 10/23/2023 1022   CO2 25 10/23/2023 1022   GLUCOSE 75 10/23/2023 1022   BUN 25 (H) 10/23/2023 1022   CREATININE 1.32 10/23/2023 1022   CALCIUM  10.3 10/23/2023 1022   GFRNONAA >60 04/09/2015 1140   GFRAA >60 04/09/2015 1140   Lab Results  Component Value Date   HGBA1C 5.3 03/21/2023   HGBA1C 5.0 07/22/2015   Lab Results  Component Value Date   INSULIN  12.6 03/21/2023   Lab Results  Component Value Date   TSH 0.78 04/10/2023   CBC    Component Value Date/Time   WBC 6.4 04/10/2023 1034   RBC 5.11 04/10/2023 1034   HGB 15.9 04/10/2023 1034   HCT 46.9 04/10/2023 1034   PLT 224.0 04/10/2023 1034   MCV 91.8 04/10/2023 1034   MCH 31.8 04/09/2015 1140   MCHC 34.0 04/10/2023 1034   RDW 12.9 04/10/2023 1034   Iron Studies No results found for: IRON, TIBC, FERRITIN, IRONPCTSAT Lipid Panel     Component Value Date/Time   CHOL 115 04/10/2023 1034   TRIG 78.0 04/10/2023 1034   HDL 45.40 04/10/2023 1034   CHOLHDL 3 04/10/2023 1034   VLDL 15.6  04/10/2023 1034   LDLCALC 54 04/10/2023 1034   Hepatic Function Panel     Component Value Date/Time   PROT 7.8 10/23/2023 1022   ALBUMIN 4.7 10/23/2023 1022   AST 24 10/23/2023 1022   ALT 28 10/23/2023 1022   ALKPHOS 48 10/23/2023 1022   BILITOT 1.0 10/23/2023 1022   BILIDIR 0.2 12/07/2022 0933      Component Value Date/Time   TSH 0.78 04/10/2023 1034   Nutritional Lab Results  Component Value Date   VD25OH 40.9 03/21/2023    Medications: Outpatient Encounter Medications as of 04/18/2024  Medication Sig   aspirin EC 81 MG tablet Take 81 mg by mouth daily.   atorvastatin  (LIPITOR) 10 MG tablet TAKE 1 TABLET BY MOUTH EVERY DAY    Azelastine  HCl 137 MCG/SPRAY SOLN INSTILL 1-2 SPRAYS INTO BOTH NOSTRILS 2 TIMES DAILY AS DIRECTED   chlorhexidine (PERIDEX) 0.12 % solution    fluticasone  (FLONASE ) 50 MCG/ACT nasal spray USE 2 SPRAYS IN BOTH NOSTRILS  DAILY   hydrOXYzine  (ATARAX ) 10 MG tablet TAKE 1 TABLET BY MOUTH 3 TIMES DAILY AS NEEDED FOR ANXIETY.   lisinopril  (ZESTRIL ) 10 MG tablet TAKE 1 TABLET BY MOUTH DAILY   Multiple Vitamins-Minerals (MULTIVITAMIN WITH MINERALS) tablet Take 1 tablet by mouth daily.   ondansetron  (ZOFRAN ) 4 MG tablet Take 1 tablet (4 mg total) by mouth every 8 (eight) hours as needed for nausea or vomiting.   psyllium (REGULOID) 0.52 g capsule Take 0.52 g by mouth daily.   sertraline  (ZOLOFT ) 100 MG tablet Take 2 tablets (200 mg total) by mouth daily.   terazosin  (HYTRIN ) 1 MG capsule TAKE 1 CAPSULE BY MOUTH AT  BEDTIME   terbinafine  (LAMISIL ) 250 MG tablet Take 1 tablet (250 mg total) by mouth daily.   tirzepatide  (ZEPBOUND ) 12.5 MG/0.5ML Pen Inject 12.5 mg into the skin once a week.   [DISCONTINUED] tirzepatide  (ZEPBOUND ) 10 MG/0.5ML Pen Inject 10 mg into the skin once a week.   No facility-administered encounter medications on file as of 04/18/2024.     Follow-Up   Return in about 4 weeks (around 05/16/2024) for For Weight Mangement with Dr. Francyne.SABRA He was informed of the importance of frequent follow up visits to maximize his success with intensive lifestyle modifications for his multiple health conditions.  Attestation Statement   Reviewed by clinician on day of visit: allergies, medications, problem list, medical history, surgical history, family history, social history, and previous encounter notes.     Lucas Francyne, MD

## 2024-04-18 NOTE — Assessment & Plan Note (Signed)
 Severe OSA per history.  On CPAP with reported good compliance. Continue PAP therapy.  Losing 15% of body weight may reduce AHI.  Continue GLP-1 aided weight loss

## 2024-04-18 NOTE — Assessment & Plan Note (Signed)
 Blood pressure has improved.  Currently on terazosin  and lisinopril .  Tirzepatide  also helps with blood pressure control.  Monitor for orthostasis as medication has been adjusted

## 2024-04-18 NOTE — Assessment & Plan Note (Signed)
 Last A1c on file was 5.3.  Continue with nutrition and behavioral strategies.  Continue Zepbound  for pharmacoprevention

## 2024-04-19 ENCOUNTER — Other Ambulatory Visit: Payer: Self-pay | Admitting: Family Medicine

## 2024-04-19 DIAGNOSIS — F419 Anxiety disorder, unspecified: Secondary | ICD-10-CM

## 2024-04-23 ENCOUNTER — Ambulatory Visit (INDEPENDENT_AMBULATORY_CARE_PROVIDER_SITE_OTHER): Payer: Self-pay

## 2024-04-23 DIAGNOSIS — B351 Tinea unguium: Secondary | ICD-10-CM

## 2024-04-23 NOTE — Progress Notes (Signed)
 Patient presents today for 4th laser treatment. Diagnosed with mycotic nail infection by Dr. Sikora.  Toenail most affected left 1st. Nails are clearing up, the 1st left nail still has some mild discoloration present.  All other systems are negative.  Nails were filed thin. Laser therapy was administered to 1-5 toenails bilaterally and patient tolerated the treatment well. All safety precautions were in place. Three passes on bilateral 1st nails and single passes on the remaining nails.  Follow-up in 6 weeks for laser # 5.

## 2024-05-06 ENCOUNTER — Telehealth (INDEPENDENT_AMBULATORY_CARE_PROVIDER_SITE_OTHER): Payer: Self-pay

## 2024-05-06 ENCOUNTER — Ambulatory Visit (INDEPENDENT_AMBULATORY_CARE_PROVIDER_SITE_OTHER): Admitting: Internal Medicine

## 2024-05-06 ENCOUNTER — Encounter (INDEPENDENT_AMBULATORY_CARE_PROVIDER_SITE_OTHER): Payer: Self-pay | Admitting: Internal Medicine

## 2024-05-06 ENCOUNTER — Encounter (INDEPENDENT_AMBULATORY_CARE_PROVIDER_SITE_OTHER): Payer: Self-pay

## 2024-05-06 VITALS — BP 119/69 | HR 69 | Temp 97.4°F | Ht 69.0 in | Wt 220.0 lb

## 2024-05-06 DIAGNOSIS — E66811 Obesity, class 1: Secondary | ICD-10-CM

## 2024-05-06 DIAGNOSIS — R7303 Prediabetes: Secondary | ICD-10-CM

## 2024-05-06 DIAGNOSIS — I10 Essential (primary) hypertension: Secondary | ICD-10-CM | POA: Diagnosis not present

## 2024-05-06 DIAGNOSIS — G4733 Obstructive sleep apnea (adult) (pediatric): Secondary | ICD-10-CM

## 2024-05-06 DIAGNOSIS — Z6832 Body mass index (BMI) 32.0-32.9, adult: Secondary | ICD-10-CM | POA: Diagnosis not present

## 2024-05-06 MED ORDER — ZEPBOUND 15 MG/0.5ML ~~LOC~~ SOAJ: 15.0000 mg | SUBCUTANEOUS | 0 refills | Status: DC

## 2024-05-06 NOTE — Telephone Encounter (Signed)
 PA zepbound  15 mg started

## 2024-05-06 NOTE — Assessment & Plan Note (Signed)
 Last A1c on file was 5.3.  Continue with nutrition and behavioral strategies.  Continue Zepbound  for pharmacoprevention

## 2024-05-06 NOTE — Telephone Encounter (Signed)
 PA Zepbound  15 mg approved

## 2024-05-06 NOTE — Assessment & Plan Note (Signed)
 Severe OSA per history.  On CPAP with reported good compliance. Continue PAP therapy.  Losing 15% of body weight may reduce AHI.  Continue GLP-1 aided weight loss

## 2024-05-06 NOTE — Telephone Encounter (Signed)
 PA Zepbound  15 mg approved, pt notified

## 2024-05-06 NOTE — Progress Notes (Signed)
 Patient presents today for medical weight management  Office: [phone]  /  Fax: [phone]  Weight Summary and Body Composition Analysis (BIA)  Vitals Temp: (!) 97.4 F (36.3 C) BP: 119/69 Pulse Rate: 69 SpO2: 98 %   Anthropometric Measurements Height: 5' 9 (1.753 m) Weight: 220 lb (99.8 kg) BMI (Calculated): 32.47 Weight at Last Visit: 220 lb Weight Lost Since Last Visit: 0 lb Weight Gained Since Last Visit: 0 lb Starting Weight: 247 lb Total Weight Loss (lbs): 27 lb (12.2 kg) Peak Weight: 285 lb   Body Composition  Body Fat %: 28.1 % Fat Mass (lbs): 62 lbs Muscle Mass (lbs): 150.6 lbs Total Body Water (lbs): 106.2 lbs Visceral Fat Rating : 16    RMR: 2285  Today's Visit #: 14  Starting Date: 03/21/23   Subjective   Chief Complaint: Obesity  Interval History  Discussed the use of AI scribe software for clinical note transcription with the patient, who gave verbal consent to proceed.  History of Present Illness Steven Sloan is a 64 year old male with obesity who presents for medical weight management.  He is currently on Zepbound  12.5 mg once a week as anti-obesity pharmacotherapy, which he has been taking since February 2025. He has maintained his weight since the last office visit. He follows a reduced calorie nutrition plan targeting 1500 calories about 60% of the time and exercises five days a week for 60 minutes, mostly cardio. He is trying to increase his intake of vegetables and fruits, mentioning a preference for apples and oranges.  He experiences some difficulty with satiety, stating that he does not feel full and has to mentally remind himself to stop eating. He reports a little constipation, which sometimes causes nausea, but this resolves after a bowel movement. He takes fiber supplements to help with constipation and acknowledges that increasing water intake might help, as he is not drinking enough currently.  He uses a CPAP  machine every night for his obstructive sleep apnea and reports no symptoms related to his aortic stenosis. For hyperlipidemia, he is taking Lipitor.  He has lost 32 pounds since starting the weight management program, which is 12.7% of his body weight over a year and one month. He has maintained his weight through the holidays.     Challenges affecting patient progress: Impaired satiety.    Pharmacotherapy for weight management: He is currently taking Zepbound  with adequate clinical response  and experiencing the following side effects: Occasional constipation.   Assessment and Plan   Treatment Plan For Obesity:  Recommended Dietary Goals  Steven Sloan is currently in the action stage of change. As such, his goal is to continue weight management plan. He has agreed to: follow the Category 3 plan - 1500 kcal per day  Behavioral Health and Counseling  We discussed the following behavioral modification strategies today: increasing lean protein intake to established goals, increasing fiber rich foods, work on meal planning and preparation, work on tracking and journaling calories using tracking application, continue to work on maintaining a reduced calorie state, getting the recommended amount of protein, incorporating whole foods, making healthy choices, staying well hydrated and practicing mindfulness when eating., and increase protein intake, fibrous foods (25 grams per day for women, 30 grams for men) and water to improve satiety and decrease hunger signals. .  Additional education and resources provided today: None  Recommended Physical Activity Goals  Steven Sloan has been advised to work up to 150 minutes of moderate intensity aerobic activity a  week and strengthening exercises 2-3 times per week for cardiovascular health, weight loss maintenance and preservation of muscle mass.  He has agreed to :  Increase the intensity, frequency or duration of strengthening exercises   Medical  Interventions and Pharmacotherapy  We discussed various medication options to help Steven Sloan with his weight loss efforts and we both agreed to : Increase Zepbound  to 15 mg once a week  Associated Conditions Impacted by Obesity Treatment  Assessment & Plan Class 1 obesity with serious comorbidity and body mass index (BMI) of 32.0 to 32.9 in adult, unspecified obesity type Class 1 obesity with associated comorbidities including hypertension, obstructive sleep apnea, prediabetes, and hyperlipidemia. Currently on Zepbound  12.5 mg weekly since February 2025. Reports maintaining weight with a 1500 calorie diet 60% of the time and exercising 5 days a week. Experiencing some constipation and occasional nausea related to constipation. No significant satiety from Zepbound , indicating potential waning effect. Discussed increasing Zepbound  to 15 mg to enhance effects. Consideration of adding metformin if no improvement with increased Zepbound  dose. Discussed the importance of protein intake, fiber, and water to manage hunger and satiety. Emphasized the role of vegetables and fruits in providing volume and low-calorie intake. - Increased Zepbound  to 15 mg weekly. - Ensure protein intake of 30-40 grams per meal. - Increase fiber intake and water consumption to manage constipation. - Will consider adding metformin if no improvement with increased Zepbound  dose. - Will follow up in 4 weeks to assess response to increased Zepbound  dose. OSA (obstructive sleep apnea) Severe OSA per history.  On CPAP with reported good compliance. Continue PAP therapy.  Losing 15% of body weight may reduce AHI.  Continue GLP-1 aided weight loss Essential hypertension, benign Blood pressure has improved.  Currently on terazosin  and lisinopril .  Tirzepatide  also helps with blood pressure control.  Monitor for orthostasis as medication has been adjusted Prediabetes Last A1c on file was 5.3.  Continue with nutrition and behavioral  strategies.  Continue Zepbound  for pharmacoprevention           Objective   Physical Exam:  Blood pressure 119/69, pulse 69, temperature (!) 97.4 F (36.3 C), height 5' 9 (1.753 m), weight 220 lb (99.8 kg), SpO2 98%. Body mass index is 32.49 kg/m.  General: He is overweight, cooperative, alert, well developed, and in no acute distress. PSYCH: Has normal mood, affect and thought process.   HEENT: EOMI, sclerae are anicteric. Lungs: Normal breathing effort, no conversational dyspnea. Extremities: No edema.  Neurologic: No gross sensory or motor deficits. No tremors or fasciculations noted.    Diagnostic Data Reviewed:  BMET    Component Value Date/Time   NA 138 10/23/2023 1022   K 4.6 10/23/2023 1022   CL 101 10/23/2023 1022   CO2 25 10/23/2023 1022   GLUCOSE 75 10/23/2023 1022   BUN 25 (H) 10/23/2023 1022   CREATININE 1.32 10/23/2023 1022   CALCIUM  10.3 10/23/2023 1022   GFRNONAA >60 04/09/2015 1140   GFRAA >60 04/09/2015 1140   Lab Results  Component Value Date   HGBA1C 5.3 03/21/2023   HGBA1C 5.0 07/22/2015   Lab Results  Component Value Date   INSULIN  12.6 03/21/2023   Lab Results  Component Value Date   TSH 0.78 04/10/2023   CBC    Component Value Date/Time   WBC 6.4 04/10/2023 1034   RBC 5.11 04/10/2023 1034   HGB 15.9 04/10/2023 1034   HCT 46.9 04/10/2023 1034   PLT 224.0 04/10/2023 1034   MCV  91.8 04/10/2023 1034   MCH 31.8 04/09/2015 1140   MCHC 34.0 04/10/2023 1034   RDW 12.9 04/10/2023 1034   Iron Studies No results found for: IRON, TIBC, FERRITIN, IRONPCTSAT Lipid Panel     Component Value Date/Time   CHOL 115 04/10/2023 1034   TRIG 78.0 04/10/2023 1034   HDL 45.40 04/10/2023 1034   CHOLHDL 3 04/10/2023 1034   VLDL 15.6 04/10/2023 1034   LDLCALC 54 04/10/2023 1034   Hepatic Function Panel     Component Value Date/Time   PROT 7.8 10/23/2023 1022   ALBUMIN 4.7 10/23/2023 1022   AST 24 10/23/2023 1022   ALT 28  10/23/2023 1022   ALKPHOS 48 10/23/2023 1022   BILITOT 1.0 10/23/2023 1022   BILIDIR 0.2 12/07/2022 0933      Component Value Date/Time   TSH 0.78 04/10/2023 1034   Nutritional Lab Results  Component Value Date   VD25OH 40.9 03/21/2023    Medications: Outpatient Encounter Medications as of 05/06/2024  Medication Sig   aspirin EC 81 MG tablet Take 81 mg by mouth daily.   atorvastatin  (LIPITOR) 10 MG tablet TAKE 1 TABLET BY MOUTH EVERY DAY   Azelastine  HCl 137 MCG/SPRAY SOLN INSTILL 1-2 SPRAYS INTO BOTH NOSTRILS 2 TIMES DAILY AS DIRECTED   chlorhexidine (PERIDEX) 0.12 % solution    fluticasone  (FLONASE ) 50 MCG/ACT nasal spray USE 2 SPRAYS IN BOTH NOSTRILS  DAILY   hydrOXYzine  (ATARAX ) 10 MG tablet TAKE 1 TABLET BY MOUTH 3 TIMES DAILY AS NEEDED FOR ANXIETY.   lisinopril  (ZESTRIL ) 10 MG tablet TAKE 1 TABLET BY MOUTH DAILY   Multiple Vitamins-Minerals (MULTIVITAMIN WITH MINERALS) tablet Take 1 tablet by mouth daily.   ondansetron  (ZOFRAN ) 4 MG tablet Take 1 tablet (4 mg total) by mouth every 8 (eight) hours as needed for nausea or vomiting.   psyllium (REGULOID) 0.52 g capsule Take 0.52 g by mouth daily.   sertraline  (ZOLOFT ) 100 MG tablet TAKE 2 TABLETS BY MOUTH EVERY DAY   terazosin  (HYTRIN ) 1 MG capsule TAKE 1 CAPSULE BY MOUTH AT  BEDTIME   terbinafine  (LAMISIL ) 250 MG tablet Take 1 tablet (250 mg total) by mouth daily.   tirzepatide  (ZEPBOUND ) 15 MG/0.5ML Pen Inject 15 mg into the skin once a week.   [DISCONTINUED] tirzepatide  (ZEPBOUND ) 12.5 MG/0.5ML Pen Inject 12.5 mg into the skin once a week.   No facility-administered encounter medications on file as of 05/06/2024.     Follow-Up   Return in about 4 weeks (around 06/03/2024) for For Weight Mangement with Dr. Francyne.SABRA He was informed of the importance of frequent follow up visits to maximize his success with intensive lifestyle modifications for his multiple health conditions.  Attestation Statement   Reviewed by clinician  on day of visit: allergies, medications, problem list, medical history, surgical history, family history, social history, and previous encounter notes.     Lucas Francyne, MD

## 2024-05-06 NOTE — Assessment & Plan Note (Signed)
 Blood pressure has improved.  Currently on terazosin  and lisinopril .  Tirzepatide  also helps with blood pressure control.  Monitor for orthostasis as medication has been adjusted

## 2024-05-09 DIAGNOSIS — R102 Pelvic and perineal pain unspecified side: Secondary | ICD-10-CM | POA: Insufficient documentation

## 2024-05-10 ENCOUNTER — Ambulatory Visit: Admitting: Behavioral Health

## 2024-05-10 ENCOUNTER — Encounter: Payer: Self-pay | Admitting: Behavioral Health

## 2024-05-10 DIAGNOSIS — F4323 Adjustment disorder with mixed anxiety and depressed mood: Secondary | ICD-10-CM

## 2024-05-10 DIAGNOSIS — Z122 Encounter for screening for malignant neoplasm of respiratory organs: Secondary | ICD-10-CM

## 2024-05-10 DIAGNOSIS — Z87891 Personal history of nicotine dependence: Secondary | ICD-10-CM

## 2024-05-10 NOTE — Progress Notes (Signed)
 "  [facility] Counselor/Therapist Progress Note  Patient ID: Steven Sloan, MRN: [id number],    Date: 05/10/2024  Time Spent: 205 p.m. until 2:58 PM, 53 minutes spent via telehealth care agility video visit.  The patient consented to the visit and is aware of his limitations.  The patient was located in his home and this therapist was located in his outpatient home therapy office. Physically the patient says he feels like he is doing well.  He continues to lose weight steadily and said there have been no heart issues.  Initially he felt that by the end of last year the beginning of this year surgery would have been eminent but he is thankful that he is able to continue working on self-care and losing weight.  Work is going fairly well.  He did take some time off over the holidays and said it was much needed.  He also bought some more plants and flowers to put out which will bloom in the spring helping to feed the honeybees and humming birds.  His father-in-law's health is not good and he says he has tried to address with his wife the amount of attention she has pain to him and he said that is led to a couple of arguments.  He is concerned about his father-in-law, more concerned about how she is dealing with her father's health but he is more concerned about how she will care for him when his heart issues do come to the point of surgery.  Aside to her saying she has for the most part stopped doing much anything around the house sleeping most of that to him.  She maxed out one of the credit cards and he did not know until he was going out to buy a few last-minute Christmas gifts.  He is concerned about some of the choices that she is making but says he is try to approach her from and even calm perspective but also has gotten into arguments with her because of his concern for various things.  Denies getting that angry is not good for his health we talked about the importance of letting go of those  things which she cannot change and he has addressed with her repeatedly.  We looked at ways that some of the stress could be taken off of him in terms of getting things done around the house.  He is continuing to use his coping skills and I encouraged him to stay steady with those.  He does contract for safety having no thoughts of hurting himself or anyone else. Treatment Type: Individual Therapy  Reported Symptoms: Anxiety, stress, depression  Mental Status Exam: Appearance:  Well Groomed     Behavior: Appropriate  Motor: Normal  Speech/Language:  Normal Rate  Affect: Appropriate  Mood: normal  Thought process: normal  Thought content:   WNL  Sensory/Perceptual disturbances:   WNL  Orientation: oriented to person, place, time/date, situation, day of week, month of year, and year  Attention: Good  Concentration: Good  Memory: WNL  Fund of knowledge:  Good  Insight:   Good  Judgment:  Good  Impulse Control: Good   Risk Assessment: Danger to Self:  No Self-injurious Behavior: No Danger to Others: No Duty to Warn:no Physical Aggression / Violence:No  Access to Firearms a concern: No  Gang Involvement:No   Subjective: The patient has temporarily plateaued with his weight loss but said that the adjusting of the dosage as expected.  At the dosage he  is at he is not having nearly as much discomfort and nausea etc.  Reports that for the most part physically he feels pretty good and has not had any specific heart related issues which she is thankful for.  He is concerned about choices that his sister is making but also how her choices are impacting his parents as his sister lives with them.  He recognizes that he can only be supportive and that she has to choose to make better decisions.  He is still getting minimal help at home.  Continues to try to find various ways to get his wife and daughter more engaged in helping out around the house especially as he is approaching the busy time  with his daughter going back to school to be involved in school and other activities.  He does continue to practice stress reduction techniques as we have practiced some.  He does contract for safety having no thoughts of hurting himself or anyone else.  Interventions: Cognitive Behavioral Therapy and Dialectical Behavioral Therapy Diagnosis:Generalized anxiety disorder  Plan: I will meet with the patient every 2 to 3 weeks. Treatment plan: Goals are to reduce the patient's anxiety and some mild depression primarily related to medical condition but also other family factors by at least 50% with a target date of July 31, 2023.  Goals for minimizing depression are for the patient to have less sadness as indicated by his report and PHQ-9 scores, have improved mood and return to a healthier level of functioning.  We will look at any other causes for depression in addition to those stated above.  We will explore how depression is experienced by him, use cognitive behavioral therapy to explore and replace unhealthy thought and behavior patterns contributing to depression as well as encouraging the sharing of feelings related to causes and symptoms of depression.  We will introduce coping skills for managing depressive symptoms.  Goals for reducing anxiety include improving his ability to better manage stress and anxiety symptoms, identify causes for anxiety and introduce ways to lower it including resolving core conflicts contributing to the anxiety.  Finally a goal for the patient will be to manage thoughts and worrisome thinking contributing to feelings of anxiety.  Interventions include providing education about anxiety to help him understand his causes and triggers, facilitate problem solution skills and coping skills for managing anxiety.  We will also use cognitive behavioral therapy to identify and change anxiety provoking thought and behavior patterns as well as dialectical behavior therapy to  introduce distress tolerance and mindfulness skills. I reviewed the treatment goals as listed above with patient's agreement and extended the target date to July 30, 2024 Progress: 30%  Lorrene CHRISTELLA Hasten, [facility]                                Lorrene CHRISTELLA Hasten, [facility]               Lorrene CHRISTELLA Hasten, [facility]               Lorrene CHRISTELLA Hasten, [facility]               Lorrene CHRISTELLA Hasten, [facility]               Lorrene CHRISTELLA Hasten, [facility]               Lorrene CHRISTELLA Hasten, [facility]  Lorrene CHRISTELLA Hasten, [facility]               Lorrene CHRISTELLA Hasten, [facility]               Lorrene CHRISTELLA Hasten, [facility]               Lorrene CHRISTELLA Hasten, [facility]               Lorrene CHRISTELLA Hasten, [facility]               Lorrene CHRISTELLA Hasten, [facility]               Lorrene CHRISTELLA Hasten, [facility]               Lorrene CHRISTELLA Hasten, [facility] "

## 2024-05-12 ENCOUNTER — Other Ambulatory Visit: Payer: Self-pay | Admitting: Family Medicine

## 2024-05-12 DIAGNOSIS — E785 Hyperlipidemia, unspecified: Secondary | ICD-10-CM

## 2024-05-12 DIAGNOSIS — I700 Atherosclerosis of aorta: Secondary | ICD-10-CM

## 2024-05-21 ENCOUNTER — Ambulatory Visit (INDEPENDENT_AMBULATORY_CARE_PROVIDER_SITE_OTHER): Admitting: Internal Medicine

## 2024-05-23 ENCOUNTER — Ambulatory Visit (INDEPENDENT_AMBULATORY_CARE_PROVIDER_SITE_OTHER): Admitting: Behavioral Health

## 2024-05-23 ENCOUNTER — Encounter: Payer: Self-pay | Admitting: Behavioral Health

## 2024-05-23 DIAGNOSIS — F4323 Adjustment disorder with mixed anxiety and depressed mood: Secondary | ICD-10-CM

## 2024-05-23 NOTE — Progress Notes (Signed)
 "  [facility] Counselor/Therapist Progress Note  Patient ID: Steven Sloan, MRN: [id number],    Date: 05/23/2024  Time Spent: 202 until 2:58 PM, 58 minutes spent via telehealth care agility video visit.  The patient consented to the visit and is aware of his limitations.  The patient was located in his home and this therapist was located in his outpatient home therapy office. Physically the patient says he feels like he is doing well.   He does say that discord in the home especially in the marriage especially the past week has been difficult.  Prior to Christmas his wife maxed out their credit cards and says she is paying that down but it puts him at a difficult place financially.  He said that she made the and friends that he could be doing more with his job.  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  That her identity has always been about what she does and how much she makes and the things that she has.  He said it got to the point in the argument that they talked about separation.  Separated previously years ago for about a year and a half.  Part of the conversation started because he addressed his concern about the way she is caring for her father who is in poor health and that leads to him feeling that she would not care well for him so he does have other plans in place but says that he is hesitant about taking any steps until after he has the heart procedure.  He also wants to make sure that they are putting enough money side financially that their daughter can be taken care of long-term.  He does say that he religiously uses coping skills including breathing relaxation music and several other things to help manage his stress level and I encouraged him to continue to do that.  Also encouraged him to be mindful of how he interacts with his  wife so that it does not lead to him getting angry which could be a negative impact on his health.  We also talked about some anger management practices including mindful that would help.  He does contract for safety having no thoughts of hurting himself or anyone else. Treatment Type: Individual Therapy  Reported Symptoms: Anxiety, stress, depression  Mental Status Exam: Appearance:  Well Groomed     Behavior: Appropriate  Motor: Normal  Speech/Language:  Normal Rate  Affect: Appropriate  Mood: normal  Thought process: normal  Thought content:   WNL  Sensory/Perceptual disturbances:   WNL  Orientation: oriented to person, place, time/date, situation, day of week, month of year, and year  Attention: Good  Concentration: Good  Memory: WNL  Fund of knowledge:  Good  Insight:   Good  Judgment:  Good  Impulse Control: Good   Risk Assessment: Danger to Self:  No Self-injurious Behavior: No Danger to Others: No Duty to Warn:no Physical Aggression / Violence:No  Access to Firearms a concern: No  Gang Involvement:No   Subjective: The patient has temporarily plateaued with his weight loss but said that the adjusting of the dosage as expected.  At the dosage he is at he is not having nearly as much discomfort and nausea etc.  Reports that for the most part physically he  feels pretty good and has not had any specific heart related issues which she is thankful for.  He is concerned about choices that his sister is making but also how her choices are impacting his parents as his sister lives with them.  He recognizes that he can only be supportive and that she has to choose to make better decisions.  He is still getting minimal help at home.  Continues to try to find various ways to get his wife and daughter more engaged in helping out around the house especially as he is approaching the busy time with his daughter going back to school to be involved in school and other activities.  He does  continue to practice stress reduction techniques as we have practiced some.  He does contract for safety having no thoughts of hurting himself or anyone else.  Interventions: Cognitive Behavioral Therapy and Dialectical Behavioral Therapy Diagnosis:Generalized anxiety disorder  Plan: I will meet with the patient every 2 to 3 weeks. Treatment plan: Goals are to reduce the patient's anxiety and some mild depression primarily related to medical condition but also other family factors by at least 50% with a target date of July 31, 2023.  Goals for minimizing depression are for the patient to have less sadness as indicated by his report and PHQ-9 scores, have improved mood and return to a healthier level of functioning.  We will look at any other causes for depression in addition to those stated above.  We will explore how depression is experienced by him, use cognitive behavioral therapy to explore and replace unhealthy thought and behavior patterns contributing to depression as well as encouraging the sharing of feelings related to causes and symptoms of depression.  We will introduce coping skills for managing depressive symptoms.  Goals for reducing anxiety include improving his ability to better manage stress and anxiety symptoms, identify causes for anxiety and introduce ways to lower it including resolving core conflicts contributing to the anxiety.  Finally a goal for the patient will be to manage thoughts and worrisome thinking contributing to feelings of anxiety.  Interventions include providing education about anxiety to help him understand his causes and triggers, facilitate problem solution skills and coping skills for managing anxiety.  We will also use cognitive behavioral therapy to identify and change anxiety provoking thought and behavior patterns as well as dialectical behavior therapy to introduce distress tolerance and mindfulness skills. I reviewed the treatment goals as listed above  with patient's agreement and extended the target date to July 30, 2024 Progress: 30%  Lorrene CHRISTELLA Hasten, [facility]                                Lorrene CHRISTELLA Hasten, [facility]               Lorrene CHRISTELLA Hasten, [facility]               Lorrene CHRISTELLA Hasten, [facility]               Lorrene CHRISTELLA Hasten, [facility]               Lorrene CHRISTELLA Hasten, [facility]               Lorrene CHRISTELLA Hasten, [facility]               Lorrene CHRISTELLA Hasten, [facility]  Lorrene CHRISTELLA Hasten, [facility]               Lorrene CHRISTELLA Hasten, [facility]               Lorrene CHRISTELLA Hasten, [facility]               Lorrene CHRISTELLA Hasten, [facility]               Lorrene CHRISTELLA Hasten, [facility]               Lorrene CHRISTELLA Hasten, [facility]               Lorrene CHRISTELLA Hasten, [facility]               Lorrene CHRISTELLA Hasten, [facility] "

## 2024-06-03 ENCOUNTER — Ambulatory Visit: Admitting: Cardiovascular Disease

## 2024-06-03 ENCOUNTER — Encounter: Payer: Self-pay | Admitting: Cardiovascular Disease

## 2024-06-03 VITALS — BP 112/66 | HR 66 | Ht 69.0 in | Wt 227.8 lb

## 2024-06-03 DIAGNOSIS — E782 Mixed hyperlipidemia: Secondary | ICD-10-CM | POA: Diagnosis not present

## 2024-06-03 DIAGNOSIS — I10 Essential (primary) hypertension: Secondary | ICD-10-CM

## 2024-06-03 DIAGNOSIS — E669 Obesity, unspecified: Secondary | ICD-10-CM

## 2024-06-03 DIAGNOSIS — I350 Nonrheumatic aortic (valve) stenosis: Secondary | ICD-10-CM

## 2024-06-03 LAB — BASIC METABOLIC PANEL WITH GFR
BUN/Creatinine Ratio: 26 — ABNORMAL HIGH (ref 10–24)
BUN: 32 mg/dL — ABNORMAL HIGH (ref 8–27)
CO2: 21 mmol/L (ref 20–29)
Calcium: 10.3 mg/dL — ABNORMAL HIGH (ref 8.6–10.2)
Chloride: 102 mmol/L (ref 96–106)
Creatinine, Ser: 1.24 mg/dL (ref 0.76–1.27)
Glucose: 93 mg/dL (ref 70–99)
Potassium: 4.4 mmol/L (ref 3.5–5.2)
Sodium: 139 mmol/L (ref 134–144)
eGFR: 66 mL/min/{1.73_m2}

## 2024-06-03 LAB — CBC
Hematocrit: 43.6 % (ref 37.5–51.0)
Hemoglobin: 14.9 g/dL (ref 13.0–17.7)
MCH: 31.9 pg (ref 26.6–33.0)
MCHC: 34.2 g/dL (ref 31.5–35.7)
MCV: 93 fL (ref 79–97)
Platelets: 151 10*3/uL (ref 150–450)
RBC: 4.67 x10E6/uL (ref 4.14–5.80)
RDW: 11.6 % (ref 11.6–15.4)
WBC: 7.5 10*3/uL (ref 3.4–10.8)

## 2024-06-03 NOTE — Progress Notes (Signed)
 " Cardiology Office Note:    Date:  06/03/2024   ID:  Steven Sloan, DOB 1960/05/05, MRN [id number]  PCP:  Levora Reyes SAUNDERS, MD   [facility] Providers Cardiologist:  Jerel Balding, MD     Referring MD: Levora Reyes SAUNDERS, MD   Chief Complaint  Patient presents with   Cardiac Valve Problem      History of Present Illness:    Steven Sloan is a 64 y.o. male with a hx of severe aortic stenosis (asymptomatic to date) and hypertension.  He remains remarkably asymptomatic.  He walks on his treadmill regularly at 2.2 miles an hour for an hour every day and also walks his dog.  Yesterday he was able to shovel snow and has not had any problems with angina, dyspnea, dizziness.  He has had occasional episodes of vasovagal presyncope, but none during exercise and no full-blown episodes of loss of consciousness..  It has been about 30 years since he was first told that he has a heart murmur.  His last echocardiogram which was performed in October 2024 already showed severe aortic stenosis with a peak aortic valve velocity of 4.7 m/s, mean gradient 50 mmHg and a dimensionless index of 0.19.  We had planned to perform another echocardiogram every 6 months, but has been about a year now.  He gets yearly low-dose CTs of the chest for lung cancer screening and his ascending aorta is consistently normal in size at about 3.7 cm in diameter by my measurement.  There is some evidence of calcification in his proximal right coronary artery and some very mild calcification in his mid LAD artery.  The aortic valve is severely calcified.  He has stable chronic kidney disease with a creatinine that has been in the 1.3-1.4 range and sees Dr. Gearline in the [place] [facility].  He takes lisinopril  for this.  Blood pressure is well-controlled.  He does not have diabetes mellitus but is currently receiving Zepbound  for weight loss and his BMI has decreased from 37 to about 33 in the last year.  He is on  atorvastatin  with excellent lipid profile including an LDL cholesterol of 54.  He has well-healed recently placed mandibular dental implants and has a couple of crowns in his left upper maxillary area, does not require any additional dental work in the near future.  Past Medical History:  Diagnosis Date   Allergy    Seasonal   Anxiety    Aortic valve stenosis 08/10/2022   severe   Chicken pox    Heart murmur    Asymptomatic -- Patient endorses previous negative evaluation   High cholesterol    Hypertension    Sleep apnea    CPAP nightly    Past Surgical History:  Procedure Laterality Date   COLONOSCOPY     COLONOSCOPY WITH PROPOFOL  N/A 04/14/2023   Procedure: COLONOSCOPY WITH PROPOFOL ;  Surgeon: Mansouraty, Aloha Raddle., MD;  Location: [facility] ENDOSCOPY;  Service: Gastroenterology;  Laterality: N/A;   POLYPECTOMY  04/14/2023   Procedure: POLYPECTOMY;  Surgeon: Wilhelmenia Aloha Raddle., MD;  Location: [facility] ENDOSCOPY;  Service: Gastroenterology;;   WISDOM TOOTH EXTRACTION      Current Medications: Current Meds  Medication Sig   aspirin EC 81 MG tablet Take 81 mg by mouth daily.   atorvastatin  (LIPITOR) 10 MG tablet TAKE 1 TABLET BY MOUTH EVERY DAY   Azelastine  HCl 137 MCG/SPRAY SOLN INSTILL 1-2 SPRAYS INTO BOTH NOSTRILS 2 TIMES DAILY AS DIRECTED   fluticasone  (FLONASE ) 50 MCG/ACT  nasal spray USE 2 SPRAYS IN BOTH NOSTRILS  DAILY   hydrOXYzine  (ATARAX ) 10 MG tablet TAKE 1 TABLET BY MOUTH 3 TIMES DAILY AS NEEDED FOR ANXIETY.   lisinopril  (ZESTRIL ) 10 MG tablet TAKE 1 TABLET BY MOUTH DAILY   Multiple Vitamins-Minerals (MULTIVITAMIN WITH MINERALS) tablet Take 1 tablet by mouth daily.   psyllium (REGULOID) 0.52 g capsule Take 0.52 g by mouth daily.   sertraline  (ZOLOFT ) 100 MG tablet TAKE 2 TABLETS BY MOUTH EVERY DAY   terazosin  (HYTRIN ) 1 MG capsule TAKE 1 CAPSULE BY MOUTH AT  BEDTIME   tirzepatide  (ZEPBOUND ) 15 MG/0.5ML Pen Inject 15 mg into the skin once a week.     Allergies:    Patient has no known allergies.       Family History: The patient's family history includes Alcohol abuse in his sister; Cancer (age of onset: 54) in his maternal grandmother; Heart attack (age of onset: 40) in his father and paternal uncle; Heart attack (age of onset: 24) in his paternal grandfather; Heart disease in his father; High Cholesterol in his father. There is no history of Colon cancer, Colon polyps, Esophageal cancer, Rectal cancer, Stomach cancer, Inflammatory bowel disease, Liver disease, or Pancreatic cancer. His paternal uncle needed a pig valve  ROS:   Please see the history of present illness.     All other systems reviewed and are negative.  EKGs/Labs/Other Studies Reviewed:    The following studies were reviewed today: Echocardiogram 02/20/2023   1. Left ventricular ejection fraction, by estimation, is 60 to 65%. The  left ventricle has normal function. The left ventricle has no regional  wall motion abnormalities. There is moderate concentric left ventricular  hypertrophy. Left ventricular  diastolic parameters are consistent with Grade II diastolic dysfunction  (pseudonormalization). The average left ventricular global longitudinal  strain is 16.9 %. The global longitudinal strain is abnormal.   2. Right ventricular systolic function is normal. The right ventricular  size is normal. Tricuspid regurgitation signal is inadequate for assessing  PA pressure.   3. The mitral valve is normal in structure. Mild mitral valve  regurgitation. No evidence of mitral stenosis.   4. The aortic valve was not well visualized. There is severe calcifcation  of the aortic valve. Aortic valve regurgitation is mild. Severe aortic  valve stenosis. Aortic valve area, by VTI measures 0.86 cm. Aortic valve  mean gradient measures 50.0 mmHg.   5. The inferior vena cava is normal in size with greater than 50%  respiratory variability, suggesting right atrial pressure of 3 mmHg.    AORTIC VALVE   AV Area (VTI):     0.86 cm  AV Vmax:           475.67 cm/s  AAV Peak Grad:      90.5 mmHg  AV Mean Grad:      50.0 mmHg  LVOT/AV VTI ratio: 0.19  AI PHT:            582 msec     EKG:    EKG Interpretation Date/Time:  Monday June 03 2024 10:16:35 EST Ventricular Rate:  66 PR Interval:  218 QRS Duration:  112 QT Interval:  386 QTC Calculation: 404 R Axis:   -47  Text Interpretation: Sinus rhythm with 1st degree A-V block Left axis deviation Left anterior fasicular block When compared with ECG of 11-Jan-2023 10:11, No significant change was found Confirmed by Ami Thornsberry ([id number]) on 06/03/2024 10:21:48 AM  Recent Labs: 10/23/2023: ALT 28; BUN 25; Creatinine, Ser 1.32; Potassium 4.6; Sodium 138  Recent Lipid Panel    Component Value Date/Time   CHOL 115 04/10/2023 1034   TRIG 78.0 04/10/2023 1034   HDL 45.40 04/10/2023 1034   CHOLHDL 3 04/10/2023 1034   VLDL 15.6 04/10/2023 1034   LDLCALC 54 04/10/2023 1034     Risk Assessment/Calculations:           Physical Exam:    VS:  BP 112/66 (BP Location: Left Arm, Patient Position: Sitting, Cuff Size: Large)   Pulse 66   Ht 5' 9 (1.753 m)   Wt 227 lb 12.8 oz (103.3 kg)   SpO2 93%   BMI 33.64 kg/m     Wt Readings from Last 3 Encounters:  06/03/24 227 lb 12.8 oz (103.3 kg)  05/06/24 220 lb (99.8 kg)  04/18/24 220 lb (99.8 kg)      General: Alert, oriented x3, no distress, severely obese Head: no evidence of trauma, PERRL, EOMI, no exophtalmos or lid lag, no myxedema, no xanthelasma; normal ears, nose and oropharynx Neck: normal jugular venous pulsations and no hepatojugular reflux; brisk carotid pulses without delay and no carotid bruits Chest: clear to auscultation, no signs of consolidation by percussion or palpation, normal fremitus, symmetrical and full respiratory excursions Cardiovascular: normal position and quality of the apical impulse, regular rhythm, normal first heart  sound, absent aortic component of the second heart sound, 3/6 musical late peaking aortic ejection murmur, no diastolic murmurs, rubs or gallops Abdomen: no tenderness or distention, no masses by palpation, no abnormal pulsatility or arterial bruits, normal bowel sounds, no hepatosplenomegaly Extremities: no clubbing, cyanosis or edema; 2+ radial, ulnar and brachial pulses bilaterally; 2+ right femoral, posterior tibial and dorsalis pedis pulses; 2+ left femoral, posterior tibial and dorsalis pedis pulses; no subclavian or femoral bruits Neurological: grossly nonfocal Psych: Normal mood and affect    ASSESSMENT:    1. Nonrheumatic aortic valve stenosis   2. Mild obesity   3. Essential hypertension, benign   4. Mixed hyperlipidemia     PLAN:    In order of problems listed above:  Severe aortic stenosis: There has been further worsening of his aortic stenosis parameters and he clearly has severe aortic stenosis.  His aortic component of the second heart sound is absent.  Carotid pulses are full, but delayed.  Most likely has a bicuspid aortic valve (although I cannot hear a distinct systolic click).  Will get an updated echocardiogram.  He is young and will be best served by aortic valve replacement rather than TAVR, reserving this as a follow-up option once his prosthetic valve deteriorates (expected in his mid 33s). Work-up before aortic valve replacement will include a cardiac catheterization to look for coronary stenoses or coronary anomalies.  Some coronary calcification is seen on previous chest CTs as part of his lung cancer screening and he does not appear to have a significant aortic aneurysm (ascending aorta diameter around 37 mm on my measurement). Obesity: He does not have diabetes mellitus and does not appear to have any of the symptoms of obstructive sleep apnea.  He seems quite fit although he is obese. HTN: Well-controlled on lisinopril  monotherapy.  He should hold lisinopril  for  cardiac catheterization. CKD3: His creatinine level is elevated, but has been stable over the last 8 years at 1.31-1.51 (estimated GFR 50-60).  Minimize dose of contrast for his cardiac catheterization. HLP: All lipid parameters in target range on atorvastatin .  Continue.  Probable vasovagal presyncope: He has not had full-blown syncope.  No recent episodes of near syncope.  Informed Consent   Shared Decision Making/Informed Consent The risks [stroke (1 in 1000), death (1 in 1000), kidney failure [usually temporary] (1 in 500), bleeding (1 in 200), allergic reaction [possibly serious] (1 in 200)], benefits (diagnostic support and management of coronary artery disease) and alternatives of a cardiac catheterization were discussed in detail with Mr. Colavito and he is willing to proceed.         Medication Adjustments/Labs and Tests Ordered: Current medicines are reviewed at length with the patient today.  Concerns regarding medicines are outlined above.  Orders Placed This Encounter  Procedures   CBC   Basic Metabolic Panel (BMET)   EKG 12-Lead   ECHOCARDIOGRAM COMPLETE   No orders of the defined types were placed in this encounter.    Patient Instructions  Medication Instructions:  Your physician recommends that you continue on your current medications as directed. Please refer to the Current Medication list given to you today.  *If you need a refill on your cardiac medications before your next appointment, please call your pharmacy*  Lab Work: TODAY: CBC, BMET  If you have labs (blood work) drawn today and your tests are completely normal, you will receive your results only by: MyChart Message (if you have MyChart) OR A paper copy in the mail If you have any lab test that is abnormal or we need to change your treatment, we will call you to review the results.  Testing/Procedures: Your physician has requested that you have an echocardiogram. Echocardiography is a painless test  that uses sound waves to create images of your heart. It provides your doctor with information about the size and shape of your heart and how well your hearts chambers and valves are working. This procedure takes approximately one hour. There are no restrictions for this procedure. Please do NOT wear cologne, perfume, aftershave, or lotions (deodorant is allowed). Please arrive 15 minutes prior to your appointment time.  Please note: We ask at that you not bring children with you during ultrasound (echo/ vascular) testing. Due to room size and safety concerns, children are not allowed in the ultrasound rooms during exams. Our front office staff cannot provide observation of children in our lobby area while testing is being conducted. An adult accompanying a patient to their appointment will only be allowed in the ultrasound room at the discretion of the ultrasound technician under special circumstances. We apologize for any inconvenience.   Follow-Up: At [facility], you and your health needs are our priority.  As part of our continuing mission to provide you with exceptional heart care, our providers are all part of one team.  This team includes your primary Cardiologist (physician) and Advanced Practice Providers or APPs (Physician Assistants and Nurse Practitioners) who all work together to provide you with the care you need, when you need it.  Your next appointment:   3 month(s)  Provider:   Jerel Balding, MD    We recommend signing up for the patient portal called MyChart.  Sign up information is provided on this After Visit Summary.  MyChart is used to connect with patients for Virtual Visits (Telemedicine).  Patients are able to view lab/test results, encounter notes, upcoming appointments, etc.  Non-urgent messages can be sent to your provider as well.   To learn more about what you can do with MyChart, go to [email or web address].   Other Instructions  Cardiac/Peripheral Catheterization   You are scheduled for a Cardiac Catheterization on Thursday, February 12 with Dr. Ozell Fell.  1. Please arrive at the [facility] (Main Entrance A) at [facility]: [address] [place], [place] [address] at 10:30 AM (This time is 2 hour(s) before your procedure to ensure your preparation).   Free valet parking service is available. You will check in at ADMITTING. The support person will be asked to wait in the waiting room.  It is OK to have someone drop you off and come back when you are ready to be discharged.        Special note: Every effort is made to have your procedure done on time. Please understand that emergencies sometimes delay scheduled procedures.  2. Diet: Nothing to eat after midnight.  3. Hydration:You need to be well hydrated before your procedure. On February 12, you may drink approved liquids (see below) until 2 hours before the procedure, with 16 oz of water as your last intake.   List of approved liquids water, clear juice, clear tea, black coffee, fruit juices, non-citric and without pulp, carbonated beverages, Gatorade, Kool -Aid, plain Jello-O and plain ice popsicles.  4. Labs: You will need to have blood drawn on Monday, February 2 at [facility] (1st Floor), [address] [place], [place] [address]. You do not need to be fasting.  5. Medication instructions in preparation for your procedure:   Contrast Allergy: No    Stop taking, Lisinopril  (Zestril  or Prinivil ) Thursday, February 12,    On the morning of your procedure, take Aspirin 81 mg and any morning medicines NOT listed above.  You may use sips of water.  6. Plan to go home the same day, you will only stay overnight if medically necessary. 7. You MUST have a responsible adult to drive you home. 8. An adult MUST be with you the first 24 hours after you arrive home. 9. Bring a current list of  your medications, and the last time and date medication taken. 10. Bring ID and current insurance cards. 11.Please wear clothes that are easy to get on and off and wear slip-on shoes.  Thank you for allowing us  to care for you!   -- [facility]             Signed, Jerel Balding, MD  06/03/2024 10:57 AM    [facility]  "

## 2024-06-04 ENCOUNTER — Ambulatory Visit: Payer: Self-pay | Admitting: Cardiovascular Disease

## 2024-06-05 ENCOUNTER — Encounter (INDEPENDENT_AMBULATORY_CARE_PROVIDER_SITE_OTHER): Payer: Self-pay | Admitting: Internal Medicine

## 2024-06-05 ENCOUNTER — Ambulatory Visit (INDEPENDENT_AMBULATORY_CARE_PROVIDER_SITE_OTHER): Admitting: Internal Medicine

## 2024-06-05 VITALS — BP 119/80 | HR 74 | Temp 98.0°F | Ht 69.0 in | Wt 217.0 lb

## 2024-06-05 DIAGNOSIS — I10 Essential (primary) hypertension: Secondary | ICD-10-CM

## 2024-06-05 DIAGNOSIS — G4733 Obstructive sleep apnea (adult) (pediatric): Secondary | ICD-10-CM

## 2024-06-05 DIAGNOSIS — Z6832 Body mass index (BMI) 32.0-32.9, adult: Secondary | ICD-10-CM

## 2024-06-05 DIAGNOSIS — R7303 Prediabetes: Secondary | ICD-10-CM

## 2024-06-05 DIAGNOSIS — I350 Nonrheumatic aortic (valve) stenosis: Secondary | ICD-10-CM

## 2024-06-05 DIAGNOSIS — E66811 Obesity, class 1: Secondary | ICD-10-CM

## 2024-06-05 MED ORDER — ZEPBOUND 15 MG/0.5ML ~~LOC~~ SOAJ: 15.0000 mg | SUBCUTANEOUS | 0 refills | Status: AC

## 2024-06-05 NOTE — Progress Notes (Unsigned)
 I  Office: [phone]  /  Fax: [phone]  Weight Summary and Body Composition Analysis (BIA)  Vitals Temp: 98 F (36.7 C) BP: 119/80 Pulse Rate: 74 SpO2: 96 %   Anthropometric Measurements Height: 5' 9 (1.753 m) Weight: 217 lb (98.4 kg) BMI (Calculated): 32.03 Weight at Last Visit: 220 lb Weight Lost Since Last Visit: 3 lb Weight Gained Since Last Visit: 0 lb Starting Weight: 247 lb Total Weight Loss (lbs): 30 lb (13.6 kg) Peak Weight: 285 lb   Body Composition  Body Fat %: 27.3 % Fat Mass (lbs): 59.4 lbs Muscle Mass (lbs): 150.4 lbs Total Body Water (lbs): 105.4 lbs Visceral Fat Rating : 16    RMR: 2285  Today's Visit #: 15  Starting Date: 03/21/23   Subjective   Chief Complaint: Obesity  Interval History  Discussed the use of AI scribe software for clinical note transcription with the patient, who gave verbal consent to proceed.  History of Present Illness Steven Sloan is a 64 year old male who presents for medical weight management.  He has lost three pounds since his last visit and follows a 1500 calorie nutrition plan about 60% of the time. He is satisfied with his progress, eats regular meals but occasionally skips them, and reports adequate protein intake. He consumes fruits and vegetables several days a week, denies sugar-sweetened drinks, and eats out once or twice a week. He experiences mild hunger between meals but is usually satisfied after meals, with early satiety and mild 'food noise'.  He engages in moderate-intensity exercise three to four days a week and does strengthening exercises once a week. He identifies time, stress, and emotional eating as barriers to his weight management. He is taking his medications as prescribed without missing any doses and denies any side effects.  He has been on a weight loss journey for three years, during which he has lost 66 pounds, equating to about 23.3% of his body weight. Since starting  with the current program, he has lost 35 pounds, which is about 14% of his initial weight. He notes periods of weight maintenance and subsequent weight loss, attributing recent weight loss to better adherence to the 1500 calorie plan and increased treadmill use after clearing it of obstructions.  He is currently on his third shot of 15 mg Zepbound  and reports no side effects such as nausea, tiredness, or constipation. He is also on Zoloft  and has hydroxyzine  available for anxiety management. He reports anxiety related to upcoming medical procedures but is using therapeutic skills to manage it.  He has a history of aortic valve issues, diagnosed four years ago, and is scheduled for a heart catheterization next week. He reports that his cardiologist informed him that his aortic valve is significantly narrowed, and he is experiencing anxiety about the upcoming procedure. He has a family history of heart disease, with both grandfathers having died from heart attacks, although they were in poor health.     Challenges affecting patient progress: Severe aortic stenosis limiting ability to engage in strengthening exercises.    Pharmacotherapy for weight management: He is currently taking Zepbound  with adequate clinical response  and without side effects..   Assessment and Plan   Treatment Plan For Obesity:  Recommended Dietary Goals  Steven Sloan is currently in the action stage of change. As such, his goal is to continue weight management plan. He has agreed to: continue current reduced-calorie meal plan  Behavioral Health and Counseling  We discussed the following behavioral modification  strategies today: continue to work on maintaining a reduced calorie state, getting the recommended amount of protein, incorporating whole foods, making healthy choices, staying well hydrated and practicing mindfulness when eating. and increase protein intake, fibrous foods (25 grams per day for women, 30 grams for men)  and water to improve satiety and decrease hunger signals. .  Additional education and resources provided today: None  Recommended Physical Activity Goals  Steven Sloan has been advised to work up to 150 minutes of moderate intensity aerobic activity a week and strengthening exercises 2-3 times per week for cardiovascular health, weight loss maintenance and preservation of muscle mass.  He has agreed to :  Continue current level of physical activity   Medical Interventions and Pharmacotherapy  We discussed various medication options to help Kier with his weight loss efforts and we both agreed to : Adequate clinical response to anti-obesity medication, continue current anti-obesity regimen  Associated Conditions Impacted by Obesity Treatment  Assessment & Plan Class 1 obesity with serious comorbidity and body mass index (BMI) of 32.0 to 32.9 in adult, unspecified obesity type  Weight: decrease of 66 lb (23.3%) over 3 years, 2 months  Start: 03/11/2021 283 lb (128.4 kg)  End: 06/05/2024 217 lb (98.4 kg)   Management is ongoing with a focus on lifestyle modifications. He has lost three pounds since the last visit and is following a 1500 calorie nutrition plan 60% of the time. He reports adequate protein intake, regular meals, and occasional skipping of meals. He denies consumption of sugar-sweetened drinks and reports mild hunger between meals. He engages in moderate intensity exercise 3-4 days a week and strengthening exercises once a week. Barriers include time, stress, and emotional eating. He is taking medications as prescribed without side effects. Body fat percentage is 27%, with a goal to get under 22%. Visceral fat remains stable. He is advised to focus on reducing refined carbohydrates to aid in weight loss and visceral fat reduction. Cardio is recommended for visceral fat reduction, and maintaining muscle mass is emphasized for long-term weight management. - Continue 1500 calorie nutrition  plan. - Continue moderate intensity exercise 3-4 days a week and strengthening exercises once a week. - Focus on reducing refined carbohydrates. - Resume Tirzepatide  (Zepbound ) 15 MG/0.5ML subcutaneous once a week after recovery from upcoming procedures. - Scheduled follow-up in 8 weeks. OSA (obstructive sleep apnea) Stable on current therapy. Weight reduction is anticipated to improve OSA severity. Continue current management; reassess symptoms as weight loss progresses.  Essential hypertension, benign Vitals:   06/05/24 1300  BP: 119/80   Stable. Blood pressure control expected to improve with continued weight reduction. No medication changes indicated today; continue current antihypertensive regimen and home BP monitoring.  He will hold lisinopril  day of procedure.  Prediabetes Lab Results  Component Value Date   HGBA1C 5.3 03/21/2023   HGBA1C 5.3 11/16/2020   HGBA1C 5.3 01/11/2019   Stable at this time. Glycemic control is being addressed through the weight management plan above, with expected improvement in insulin  resistance and metabolic parameters as weight loss progresses. Will continue to monitor A1c and glucose trends.   Severe aortic stenosis Currently asymptomatic.  He will be undergoing cardiac catheterization in anticipation to valve surgery.  This restricts his ability to engage in strengthening exercises.  Patient encouraged to continue to perform light activity          Objective   Physical Exam:  Blood pressure 119/80, pulse 74, temperature 98 F (36.7 C), height 5' 9 (1.753  m), weight 217 lb (98.4 kg), SpO2 96%. Body mass index is 32.05 kg/m.  General: He is overweight, cooperative, alert, well developed, and in no acute distress. PSYCH: Has normal mood, affect and thought process.   HEENT: EOMI, sclerae are anicteric. Lungs: Normal breathing effort, no conversational dyspnea. Extremities: No edema.  Neurologic: No gross sensory or motor deficits.  No tremors or fasciculations noted.    Diagnostic Data Reviewed:  BMET    Component Value Date/Time   NA 139 06/03/2024 1113   K 4.4 06/03/2024 1113   CL 102 06/03/2024 1113   CO2 21 06/03/2024 1113   GLUCOSE 93 06/03/2024 1113   GLUCOSE 75 10/23/2023 1022   BUN 32 (H) 06/03/2024 1113   CREATININE 1.24 06/03/2024 1113   CALCIUM  10.3 (H) 06/03/2024 1113   GFRNONAA >60 04/09/2015 1140   GFRAA >60 04/09/2015 1140   Lab Results  Component Value Date   HGBA1C 5.3 03/21/2023   HGBA1C 5.0 07/22/2015   Lab Results  Component Value Date   INSULIN  12.6 03/21/2023   Lab Results  Component Value Date   TSH 0.78 04/10/2023   CBC    Component Value Date/Time   WBC 7.5 06/03/2024 1113   WBC 6.4 04/10/2023 1034   RBC 4.67 06/03/2024 1113   RBC 5.11 04/10/2023 1034   HGB 14.9 06/03/2024 1113   HCT 43.6 06/03/2024 1113   PLT 151 06/03/2024 1113   MCV 93 06/03/2024 1113   MCH 31.9 06/03/2024 1113   MCH 31.8 04/09/2015 1140   MCHC 34.2 06/03/2024 1113   MCHC 34.0 04/10/2023 1034   RDW 11.6 06/03/2024 1113   Iron Studies No results found for: IRON, TIBC, FERRITIN, IRONPCTSAT Lipid Panel     Component Value Date/Time   CHOL 115 04/10/2023 1034   TRIG 78.0 04/10/2023 1034   HDL 45.40 04/10/2023 1034   CHOLHDL 3 04/10/2023 1034   VLDL 15.6 04/10/2023 1034   LDLCALC 54 04/10/2023 1034   Hepatic Function Panel     Component Value Date/Time   PROT 7.8 10/23/2023 1022   ALBUMIN 4.7 10/23/2023 1022   AST 24 10/23/2023 1022   ALT 28 10/23/2023 1022   ALKPHOS 48 10/23/2023 1022   BILITOT 1.0 10/23/2023 1022   BILIDIR 0.2 12/07/2022 0933      Component Value Date/Time   TSH 0.78 04/10/2023 1034   Nutritional Lab Results  Component Value Date   VD25OH 40.9 03/21/2023    Medications: Outpatient Encounter Medications as of 06/05/2024  Medication Sig   aspirin EC 81 MG tablet Take 81 mg by mouth daily.   atorvastatin  (LIPITOR) 10 MG tablet TAKE 1 TABLET BY  MOUTH EVERY DAY   Azelastine  HCl 137 MCG/SPRAY SOLN INSTILL 1-2 SPRAYS INTO BOTH NOSTRILS 2 TIMES DAILY AS DIRECTED (Patient taking differently: Place 1-2 sprays into both nostrils daily.)   fluticasone  (FLONASE ) 50 MCG/ACT nasal spray USE 2 SPRAYS IN BOTH NOSTRILS  DAILY   hydrOXYzine  (ATARAX ) 10 MG tablet TAKE 1 TABLET BY MOUTH 3 TIMES DAILY AS NEEDED FOR ANXIETY.   lisinopril  (ZESTRIL ) 10 MG tablet TAKE 1 TABLET BY MOUTH DAILY (Patient taking differently: Take 10 mg by mouth at bedtime.)   Multiple Vitamins-Minerals (MULTIVITAMIN WITH MINERALS) tablet Take 1 tablet by mouth daily.   ondansetron  (ZOFRAN ) 4 MG tablet Take 1 tablet (4 mg total) by mouth every 8 (eight) hours as needed for nausea or vomiting.   PSYLLIUM PO Take 12 capsules by mouth daily.   sertraline  (ZOLOFT ) 100  MG tablet TAKE 2 TABLETS BY MOUTH EVERY DAY   terazosin  (HYTRIN ) 1 MG capsule TAKE 1 CAPSULE BY MOUTH AT  BEDTIME   terbinafine  (LAMISIL ) 250 MG tablet Take 250 mg by mouth daily.   tirzepatide  (ZEPBOUND ) 15 MG/0.5ML Pen Inject 15 mg into the skin once a week.   No facility-administered encounter medications on file as of 06/05/2024.     Follow-Up   No follow-ups on file.SABRA He was informed of the importance of frequent follow up visits to maximize his success with intensive lifestyle modifications for his multiple health conditions.  Attestation Statement   Reviewed by clinician on day of visit: allergies, medications, problem list, medical history, surgical history, family history, social history, and previous encounter notes.     Lucas Parker, MD

## 2024-06-06 NOTE — Assessment & Plan Note (Signed)
 Lab Results  Component Value Date   HGBA1C 5.3 03/21/2023   HGBA1C 5.3 11/16/2020   HGBA1C 5.3 01/11/2019   Stable at this time. Glycemic control is being addressed through the weight management plan above, with expected improvement in insulin  resistance and metabolic parameters as weight loss progresses. Will continue to monitor A1c and glucose trends.

## 2024-06-06 NOTE — Assessment & Plan Note (Signed)
 Stable on current therapy. Weight reduction is anticipated to improve OSA severity. Continue current management; reassess symptoms as weight loss progresses.

## 2024-06-06 NOTE — Assessment & Plan Note (Signed)
 Vitals:   06/05/24 1300  BP: 119/80   Stable. Blood pressure control expected to improve with continued weight reduction. No medication changes indicated today; continue current antihypertensive regimen and home BP monitoring.  He will hold lisinopril  day of procedure.

## 2024-06-06 NOTE — Assessment & Plan Note (Signed)
 Currently asymptomatic.  He will be undergoing cardiac catheterization in anticipation to valve surgery.  This restricts his ability to engage in strengthening exercises.  Patient encouraged to continue to perform light activity

## 2024-06-07 ENCOUNTER — Ambulatory Visit

## 2024-06-07 ENCOUNTER — Ambulatory Visit: Admitting: Behavioral Health

## 2024-06-07 ENCOUNTER — Encounter: Payer: Self-pay | Admitting: Behavioral Health

## 2024-06-07 DIAGNOSIS — B351 Tinea unguium: Secondary | ICD-10-CM

## 2024-06-07 DIAGNOSIS — F4323 Adjustment disorder with mixed anxiety and depressed mood: Secondary | ICD-10-CM

## 2024-06-07 NOTE — Progress Notes (Signed)
 Patient presents today for the 5th laser treatment. Diagnosed with mycotic nail infection by Dr. Sikora.   Toenail most affected 1st bilaterally. Nails are clearing up.  All other systems are negative.  Nails were filed thin. Laser therapy was administered to 1-5 toenails bilaterally and patient tolerated the treatment well. All safety precautions were in place.  Three passes on bilateral 1st nails and single passes on the remaining nails.  Post treatment instructions reviewed and provided to patient. Patient had no questions regarding plan of care.   Follow up in 8 weeks for laser # 6.

## 2024-06-07 NOTE — Progress Notes (Signed)
 "  [facility] Counselor/Therapist Progress Note  Patient ID: Dora Clauss, MRN: [id number],    Date: 06/07/2024  Time Spent: 11 AM until 11:54 AM, 54 minutes spent via telehealth care agility video visit.  The patient consented to the visit and is aware of his limitations.  The patient was located in his home and this therapist was located in his outpatient home therapy office.  The patient saw his cardiologist 2 days ago and found out that it is time for him to have the surgery.  He has known for almost 3-1/2 years that time was coming but was a little shocked because he had not really experienced any of the symptoms that his cardiologist told him that he would.  The doctor told him that with a normal valve it opens fully and the second sound that you hear with a heartbeat is the valve closing or as he described it slapping shut..  His valve is only opening about one fourth of what it should and not closing well so the doctor can no longer hear that second sound which makes him know it is time for the surgery.  He is having catheterizations next week to make sure there are no additional issues including blocked arteries which will tell them a little bit more but they already know that they have to completely replace the valve as well as clean up some calcification that is around the valve area and 1 other small area.  He just found out for sure 2 days ago and is not sure yet of the surgery date so he is still trying to wrap his head and is hard around this.  Intellectually he is saying the right things to himself saying positive knowing that he is but they are working getting ready for this physically and is in better shape.  He has lost almost 70 pounds.  He is walking multiple miles daily and staying active.  He is using coping skills regularly and eating and sleeping and hydrating well.  He acknowledges anxiety and fear mostly of the unknown.  His father had a heart attack 20 years ago  and thankfully was on the table because he had passed out.  His wife's grandmother had a procedure and died on the operating table.  I reminded him that these were 20 and 30 years ago and that neither of them were in very good shape and that he is.  We talked about acknowledging the emotions associated with knowing this is coming but calming some of those down with as much knowledges he can as well as cognitively staying positively focused and how the intellectual affects the emotional in a positive way.  He does for the most part feels that he has some care lined up but is going to work on that this weekend to see what his insurance might cover.  We talked about things like having a life alert around his neck or having a monitor that his wife could see in case she is not in the room with him to help alleviate his fear and anxiety after surgery.  He recognizes that he has a very good cardiology team and is confident in their ability.  He says that he is working on finding the intellectual emotional and spiritual approach that will put him in the best place possible.  We will continue to work on that with him.  I will continue to meet with him every 2 weeks but he is aware that  we can be more often if that would be beneficial to him.  He does contract for safety having no thoughts of hurting himself or anyone else. Treatment Type: Individual Therapy  Reported Symptoms: Anxiety, stress, depression  Mental Status Exam: Appearance:  Well Groomed     Behavior: Appropriate  Motor: Normal  Speech/Language:  Normal Rate  Affect: Appropriate  Mood: normal  Thought process: normal  Thought content:   WNL  Sensory/Perceptual disturbances:   WNL  Orientation: oriented to person, place, time/date, situation, day of week, month of year, and year  Attention: Good  Concentration: Good  Memory: WNL  Fund of knowledge:  Good  Insight:   Good  Judgment:  Good  Impulse Control: Good   Risk  Assessment: Danger to Self:  No Self-injurious Behavior: No Danger to Others: No Duty to Warn:no Physical Aggression / Violence:No  Access to Firearms a concern: No  Gang Involvement:No   Subjective: The patient has temporarily plateaued with his weight loss but said that the adjusting of the dosage as expected.  At the dosage he is at he is not having nearly as much discomfort and nausea etc.  Reports that for the most part physically he feels pretty good and has not had any specific heart related issues which she is thankful for.  He is concerned about choices that his sister is making but also how her choices are impacting his parents as his sister lives with them.  He recognizes that he can only be supportive and that she has to choose to make better decisions.  He is still getting minimal help at home.  Continues to try to find various ways to get his wife and daughter more engaged in helping out around the house especially as he is approaching the busy time with his daughter going back to school to be involved in school and other activities.  He does continue to practice stress reduction techniques as we have practiced some.  He does contract for safety having no thoughts of hurting himself or anyone else.  Interventions: Cognitive Behavioral Therapy and Dialectical Behavioral Therapy Diagnosis:Generalized anxiety disorder  Plan: I will meet with the patient every 2 to 3 weeks. Treatment plan: Goals are to reduce the patient's anxiety and some mild depression primarily related to medical condition but also other family factors by at least 50% with a target date of July 31, 2023.  Goals for minimizing depression are for the patient to have less sadness as indicated by his report and PHQ-9 scores, have improved mood and return to a healthier level of functioning.  We will look at any other causes for depression in addition to those stated above.  We will explore how depression is  experienced by him, use cognitive behavioral therapy to explore and replace unhealthy thought and behavior patterns contributing to depression as well as encouraging the sharing of feelings related to causes and symptoms of depression.  We will introduce coping skills for managing depressive symptoms.  Goals for reducing anxiety include improving his ability to better manage stress and anxiety symptoms, identify causes for anxiety and introduce ways to lower it including resolving core conflicts contributing to the anxiety.  Finally a goal for the patient will be to manage thoughts and worrisome thinking contributing to feelings of anxiety.  Interventions include providing education about anxiety to help him understand his causes and triggers, facilitate problem solution skills and coping skills for managing anxiety.  We will also use cognitive behavioral therapy  to identify and change anxiety provoking thought and behavior patterns as well as dialectical behavior therapy to introduce distress tolerance and mindfulness skills. I reviewed the treatment goals as listed above with patient's agreement and extended the target date to July 30, 2024 Progress: 30%  Lorrene CHRISTELLA Hasten, [facility]                                Lorrene CHRISTELLA Hasten, [facility]               Lorrene CHRISTELLA Hasten, [facility]               Lorrene CHRISTELLA Hasten, [facility]               Lorrene CHRISTELLA Hasten, [facility]               Lorrene CHRISTELLA Hasten, [facility]               Lorrene CHRISTELLA Hasten, [facility]               Lorrene CHRISTELLA Hasten, [facility]               Lorrene CHRISTELLA Hasten, [facility]               Lorrene CHRISTELLA Hasten, [facility]               Lorrene CHRISTELLA Hasten, [facility]               Lorrene CHRISTELLA Hasten, [facility]               Lorrene CHRISTELLA Hasten, [facility]               Lorrene CHRISTELLA Hasten, [facility]               Lorrene CHRISTELLA Hasten,  [facility]               Lorrene CHRISTELLA Hasten, [facility]               Lorrene CHRISTELLA Hasten, [facility] "

## 2024-06-10 ENCOUNTER — Encounter: Admitting: Family Medicine

## 2024-06-10 DIAGNOSIS — Z0000 Encounter for general adult medical examination without abnormal findings: Secondary | ICD-10-CM

## 2024-06-10 DIAGNOSIS — F419 Anxiety disorder, unspecified: Secondary | ICD-10-CM

## 2024-06-10 DIAGNOSIS — E785 Hyperlipidemia, unspecified: Secondary | ICD-10-CM

## 2024-06-10 DIAGNOSIS — I10 Essential (primary) hypertension: Secondary | ICD-10-CM

## 2024-06-13 ENCOUNTER — Ambulatory Visit (HOSPITAL_COMMUNITY): Admission: RE | Admit: 2024-06-13 | Source: Home / Self Care | Admitting: Cardiovascular Disease

## 2024-06-13 ENCOUNTER — Encounter (HOSPITAL_COMMUNITY): Admission: RE | Payer: Self-pay | Source: Home / Self Care

## 2024-06-19 ENCOUNTER — Ambulatory Visit: Admitting: Behavioral Health

## 2024-07-03 ENCOUNTER — Ambulatory Visit: Admitting: Behavioral Health

## 2024-07-04 ENCOUNTER — Ambulatory Visit (HOSPITAL_COMMUNITY)

## 2024-07-17 ENCOUNTER — Ambulatory Visit: Admitting: Behavioral Health

## 2024-08-01 ENCOUNTER — Ambulatory Visit (INDEPENDENT_AMBULATORY_CARE_PROVIDER_SITE_OTHER): Admitting: Internal Medicine

## 2024-08-02 ENCOUNTER — Ambulatory Visit
